# Patient Record
Sex: Female | Born: 1935 | Race: White | Hispanic: No | Smoking: Former smoker
Health system: Southern US, Community
[De-identification: ages and names within clinical notes are randomized; demographics above are authoritative.]

## PROBLEM LIST (undated history)

## (undated) DIAGNOSIS — D649 Anemia, unspecified: Secondary | ICD-10-CM

## (undated) DIAGNOSIS — M810 Age-related osteoporosis without current pathological fracture: Secondary | ICD-10-CM

## (undated) DIAGNOSIS — M1990 Unspecified osteoarthritis, unspecified site: Secondary | ICD-10-CM

## (undated) DIAGNOSIS — K5790 Diverticulosis of intestine, part unspecified, without perforation or abscess without bleeding: Secondary | ICD-10-CM

## (undated) DIAGNOSIS — R51 Headache: Secondary | ICD-10-CM

## (undated) DIAGNOSIS — E079 Disorder of thyroid, unspecified: Secondary | ICD-10-CM

## (undated) HISTORY — PX: EYE SURGERY: SHX253

## (undated) HISTORY — PX: BLADDER SURGERY: SHX569

## (undated) HISTORY — PX: APPENDECTOMY: SHX54

## (undated) HISTORY — PX: ABDOMINAL HYSTERECTOMY: SHX81

## (undated) HISTORY — PX: OTHER SURGICAL HISTORY: SHX169

---

## 1996-05-01 HISTORY — PX: COLON SURGERY: SHX602

## 2003-05-02 DIAGNOSIS — D649 Anemia, unspecified: Secondary | ICD-10-CM

## 2003-05-02 HISTORY — DX: Anemia, unspecified: D64.9

## 2004-04-11 ENCOUNTER — Ambulatory Visit: Payer: Self-pay

## 2004-04-15 ENCOUNTER — Ambulatory Visit: Payer: Self-pay | Admitting: Obstetrics and Gynecology

## 2004-05-18 ENCOUNTER — Ambulatory Visit: Payer: Self-pay

## 2004-08-05 ENCOUNTER — Ambulatory Visit: Payer: Self-pay | Admitting: Podiatry

## 2005-04-26 ENCOUNTER — Ambulatory Visit: Payer: Self-pay | Admitting: Podiatry

## 2005-11-14 ENCOUNTER — Ambulatory Visit: Payer: Self-pay | Admitting: Internal Medicine

## 2006-12-20 ENCOUNTER — Ambulatory Visit: Payer: Self-pay | Admitting: Internal Medicine

## 2007-01-01 ENCOUNTER — Ambulatory Visit: Payer: Self-pay | Admitting: Internal Medicine

## 2007-08-23 ENCOUNTER — Ambulatory Visit: Payer: Self-pay | Admitting: General Surgery

## 2009-05-04 ENCOUNTER — Ambulatory Visit: Payer: Self-pay | Admitting: Rheumatology

## 2009-05-04 ENCOUNTER — Ambulatory Visit: Payer: Self-pay | Admitting: Internal Medicine

## 2010-07-01 ENCOUNTER — Ambulatory Visit: Payer: Self-pay | Admitting: Gastroenterology

## 2010-07-04 ENCOUNTER — Ambulatory Visit: Payer: Self-pay | Admitting: Internal Medicine

## 2011-05-02 DIAGNOSIS — R519 Headache, unspecified: Secondary | ICD-10-CM

## 2011-05-02 HISTORY — DX: Headache, unspecified: R51.9

## 2011-10-24 ENCOUNTER — Ambulatory Visit: Payer: Self-pay | Admitting: Rheumatology

## 2012-02-15 ENCOUNTER — Ambulatory Visit: Payer: Self-pay | Admitting: Neurology

## 2013-09-19 ENCOUNTER — Encounter: Payer: Self-pay | Admitting: Podiatry

## 2013-09-19 ENCOUNTER — Ambulatory Visit (INDEPENDENT_AMBULATORY_CARE_PROVIDER_SITE_OTHER): Payer: Medicare Other | Admitting: Podiatry

## 2013-09-19 VITALS — BP 101/51 | HR 73 | Resp 16 | Ht 62.0 in | Wt 115.0 lb

## 2013-09-19 DIAGNOSIS — L84 Corns and callosities: Secondary | ICD-10-CM

## 2013-09-19 DIAGNOSIS — M7750 Other enthesopathy of unspecified foot: Secondary | ICD-10-CM

## 2013-09-19 NOTE — Progress Notes (Signed)
   Subjective:    Patient ID: Emma Kennedy, female    DOB: 01-16-1930, 80 y.o.   MRN: [id number]  HPI Comments: i have warts on the bottom of my right foot. It does hurt with pressure. They have been there since mothers day 2015. i have used Vaseline and callus pads. They are getting some better.     Review of Systems  Constitutional: Positive for fatigue.  HENT: Positive for trouble swallowing.        Sinus problems  Eyes: Positive for pain.  Respiratory: Positive for shortness of breath.   Cardiovascular:       Calf pain when walking  Gastrointestinal: Positive for abdominal pain.  Endocrine: Positive for heat intolerance.       Excessive thirst  Musculoskeletal:       Joint pain Back pain Difficulty walking   Neurological: Positive for weakness.  Hematological: Bruises/bleeds easily.  Psychiatric/Behavioral: The patient is nervous/anxious.   All other systems reviewed and are negative.      Objective:   Physical Exam        Assessment & Plan:

## 2013-09-19 NOTE — Progress Notes (Signed)
Subjective:     Patient ID: Emma Kennedy, female   DOB: Apr 26, 1930, 80 y.o.   MRN: [id number]  HPI patient presents stating that she does have pain with a lesion on her right foot and also pain in her feet in general for which she wears orthotics. States it's been really bothering her for the last 3 or 4 weeks   Review of Systems  All other systems reviewed and are negative.      Objective:   Physical Exam  Nursing note and vitals reviewed. Constitutional: She is oriented to person, place, and time.  Cardiovascular: Intact distal pulses.   Musculoskeletal: Normal range of motion.  Neurological: She is oriented to person, place, and time.  Skin: Skin is warm.   neurovascular status intact with no changes in health history and patient noted to have a painful keratotic lesion underneath the right foot around the third metatarsal and general instability of the arch upon gait evaluation. 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

## 2015-04-15 ENCOUNTER — Other Ambulatory Visit: Payer: Self-pay | Admitting: Specialist

## 2015-04-15 DIAGNOSIS — R0602 Shortness of breath: Secondary | ICD-10-CM

## 2015-04-15 DIAGNOSIS — J849 Interstitial pulmonary disease, unspecified: Secondary | ICD-10-CM

## 2015-04-28 ENCOUNTER — Other Ambulatory Visit: Payer: Self-pay | Admitting: Specialist

## 2015-04-28 ENCOUNTER — Ambulatory Visit
Admission: RE | Admit: 2015-04-28 | Discharge: 2015-04-28 | Disposition: A | Discharge status: Home / Self Care | Payer: Medicare Other | Source: Ambulatory Visit | Attending: Specialist | Admitting: Specialist

## 2015-04-28 DIAGNOSIS — J849 Interstitial pulmonary disease, unspecified: Secondary | ICD-10-CM | POA: Diagnosis not present

## 2015-04-28 DIAGNOSIS — N200 Calculus of kidney: Secondary | ICD-10-CM | POA: Diagnosis not present

## 2015-04-28 DIAGNOSIS — R0602 Shortness of breath: Secondary | ICD-10-CM | POA: Insufficient documentation

## 2015-04-28 DIAGNOSIS — I2510 Atherosclerotic heart disease of native coronary artery without angina pectoris: Secondary | ICD-10-CM | POA: Diagnosis not present

## 2015-05-14 DIAGNOSIS — M06041 Rheumatoid arthritis without rheumatoid factor, right hand: Secondary | ICD-10-CM | POA: Diagnosis not present

## 2015-05-14 DIAGNOSIS — M06042 Rheumatoid arthritis without rheumatoid factor, left hand: Secondary | ICD-10-CM | POA: Diagnosis not present

## 2015-05-14 DIAGNOSIS — J849 Interstitial pulmonary disease, unspecified: Secondary | ICD-10-CM | POA: Diagnosis not present

## 2015-05-17 DIAGNOSIS — M0579 Rheumatoid arthritis with rheumatoid factor of multiple sites without organ or systems involvement: Secondary | ICD-10-CM | POA: Diagnosis not present

## 2015-05-17 DIAGNOSIS — Z79899 Other long term (current) drug therapy: Secondary | ICD-10-CM | POA: Diagnosis not present

## 2015-05-20 DIAGNOSIS — M65341 Trigger finger, right ring finger: Secondary | ICD-10-CM | POA: Diagnosis not present

## 2015-05-20 DIAGNOSIS — M150 Primary generalized (osteo)arthritis: Secondary | ICD-10-CM | POA: Diagnosis not present

## 2015-05-20 DIAGNOSIS — J849 Interstitial pulmonary disease, unspecified: Secondary | ICD-10-CM | POA: Diagnosis not present

## 2015-05-20 DIAGNOSIS — M0579 Rheumatoid arthritis with rheumatoid factor of multiple sites without organ or systems involvement: Secondary | ICD-10-CM | POA: Diagnosis not present

## 2015-05-20 DIAGNOSIS — M542 Cervicalgia: Secondary | ICD-10-CM | POA: Diagnosis not present

## 2015-06-10 DIAGNOSIS — R0789 Other chest pain: Secondary | ICD-10-CM | POA: Diagnosis not present

## 2015-06-10 DIAGNOSIS — M06041 Rheumatoid arthritis without rheumatoid factor, right hand: Secondary | ICD-10-CM | POA: Diagnosis not present

## 2015-06-10 DIAGNOSIS — R0602 Shortness of breath: Secondary | ICD-10-CM | POA: Diagnosis not present

## 2015-06-10 DIAGNOSIS — E784 Other hyperlipidemia: Secondary | ICD-10-CM | POA: Diagnosis not present

## 2015-06-10 DIAGNOSIS — M06042 Rheumatoid arthritis without rheumatoid factor, left hand: Secondary | ICD-10-CM | POA: Diagnosis not present

## 2015-06-28 DIAGNOSIS — R0789 Other chest pain: Secondary | ICD-10-CM | POA: Diagnosis not present

## 2015-06-28 DIAGNOSIS — R0602 Shortness of breath: Secondary | ICD-10-CM | POA: Diagnosis not present

## 2015-07-06 DIAGNOSIS — R0602 Shortness of breath: Secondary | ICD-10-CM | POA: Diagnosis not present

## 2015-07-06 DIAGNOSIS — J069 Acute upper respiratory infection, unspecified: Secondary | ICD-10-CM | POA: Diagnosis not present

## 2015-07-13 DIAGNOSIS — R05 Cough: Secondary | ICD-10-CM | POA: Diagnosis not present

## 2015-07-13 DIAGNOSIS — M06041 Rheumatoid arthritis without rheumatoid factor, right hand: Secondary | ICD-10-CM | POA: Diagnosis not present

## 2015-07-13 DIAGNOSIS — G609 Hereditary and idiopathic neuropathy, unspecified: Secondary | ICD-10-CM | POA: Diagnosis not present

## 2015-07-13 DIAGNOSIS — M06042 Rheumatoid arthritis without rheumatoid factor, left hand: Secondary | ICD-10-CM | POA: Diagnosis not present

## 2015-07-13 DIAGNOSIS — J069 Acute upper respiratory infection, unspecified: Secondary | ICD-10-CM | POA: Diagnosis not present

## 2015-07-13 DIAGNOSIS — R0602 Shortness of breath: Secondary | ICD-10-CM | POA: Diagnosis not present

## 2015-07-19 DIAGNOSIS — E784 Other hyperlipidemia: Secondary | ICD-10-CM | POA: Diagnosis not present

## 2015-07-19 DIAGNOSIS — R0602 Shortness of breath: Secondary | ICD-10-CM | POA: Diagnosis not present

## 2015-07-19 DIAGNOSIS — R0789 Other chest pain: Secondary | ICD-10-CM | POA: Diagnosis not present

## 2015-07-23 DIAGNOSIS — J8489 Other specified interstitial pulmonary diseases: Secondary | ICD-10-CM | POA: Diagnosis not present

## 2015-07-23 DIAGNOSIS — R05 Cough: Secondary | ICD-10-CM | POA: Diagnosis not present

## 2015-09-29 DIAGNOSIS — E559 Vitamin D deficiency, unspecified: Secondary | ICD-10-CM | POA: Diagnosis not present

## 2015-09-29 DIAGNOSIS — D6489 Other specified anemias: Secondary | ICD-10-CM | POA: Diagnosis not present

## 2015-09-29 DIAGNOSIS — E784 Other hyperlipidemia: Secondary | ICD-10-CM | POA: Diagnosis not present

## 2015-09-29 DIAGNOSIS — E034 Atrophy of thyroid (acquired): Secondary | ICD-10-CM | POA: Diagnosis not present

## 2015-10-07 DIAGNOSIS — D6489 Other specified anemias: Secondary | ICD-10-CM | POA: Diagnosis not present

## 2015-10-07 DIAGNOSIS — M810 Age-related osteoporosis without current pathological fracture: Secondary | ICD-10-CM | POA: Diagnosis not present

## 2015-10-07 DIAGNOSIS — E559 Vitamin D deficiency, unspecified: Secondary | ICD-10-CM | POA: Diagnosis not present

## 2015-10-07 DIAGNOSIS — M06042 Rheumatoid arthritis without rheumatoid factor, left hand: Secondary | ICD-10-CM | POA: Diagnosis not present

## 2015-10-07 DIAGNOSIS — G629 Polyneuropathy, unspecified: Secondary | ICD-10-CM | POA: Diagnosis not present

## 2015-10-07 DIAGNOSIS — E784 Other hyperlipidemia: Secondary | ICD-10-CM | POA: Diagnosis not present

## 2015-10-07 DIAGNOSIS — M06041 Rheumatoid arthritis without rheumatoid factor, right hand: Secondary | ICD-10-CM | POA: Diagnosis not present

## 2015-10-07 DIAGNOSIS — I2510 Atherosclerotic heart disease of native coronary artery without angina pectoris: Secondary | ICD-10-CM | POA: Diagnosis not present

## 2015-10-07 DIAGNOSIS — E034 Atrophy of thyroid (acquired): Secondary | ICD-10-CM | POA: Diagnosis not present

## 2015-10-07 DIAGNOSIS — M150 Primary generalized (osteo)arthritis: Secondary | ICD-10-CM | POA: Diagnosis not present

## 2015-10-07 DIAGNOSIS — G250 Essential tremor: Secondary | ICD-10-CM | POA: Diagnosis not present

## 2015-10-25 DIAGNOSIS — M810 Age-related osteoporosis without current pathological fracture: Secondary | ICD-10-CM | POA: Diagnosis not present

## 2015-11-11 DIAGNOSIS — M0579 Rheumatoid arthritis with rheumatoid factor of multiple sites without organ or systems involvement: Secondary | ICD-10-CM | POA: Diagnosis not present

## 2015-11-16 DIAGNOSIS — M545 Low back pain: Secondary | ICD-10-CM | POA: Diagnosis not present

## 2015-11-16 DIAGNOSIS — M65341 Trigger finger, right ring finger: Secondary | ICD-10-CM | POA: Diagnosis not present

## 2015-11-16 DIAGNOSIS — M0579 Rheumatoid arthritis with rheumatoid factor of multiple sites without organ or systems involvement: Secondary | ICD-10-CM | POA: Diagnosis not present

## 2015-11-16 DIAGNOSIS — G8929 Other chronic pain: Secondary | ICD-10-CM | POA: Diagnosis not present

## 2015-11-16 DIAGNOSIS — M150 Primary generalized (osteo)arthritis: Secondary | ICD-10-CM | POA: Diagnosis not present

## 2015-11-16 DIAGNOSIS — R252 Cramp and spasm: Secondary | ICD-10-CM | POA: Diagnosis not present

## 2015-11-16 DIAGNOSIS — M810 Age-related osteoporosis without current pathological fracture: Secondary | ICD-10-CM | POA: Diagnosis not present

## 2015-11-16 DIAGNOSIS — M542 Cervicalgia: Secondary | ICD-10-CM | POA: Diagnosis not present

## 2015-11-19 DIAGNOSIS — M810 Age-related osteoporosis without current pathological fracture: Secondary | ICD-10-CM | POA: Diagnosis not present

## 2015-11-22 ENCOUNTER — Ambulatory Visit: Payer: PPO | Attending: Rheumatology | Admitting: Occupational Therapy

## 2015-11-22 DIAGNOSIS — M79641 Pain in right hand: Secondary | ICD-10-CM | POA: Insufficient documentation

## 2015-11-22 DIAGNOSIS — M25641 Stiffness of right hand, not elsewhere classified: Secondary | ICD-10-CM | POA: Diagnosis not present

## 2015-11-22 DIAGNOSIS — M6530 Trigger finger, unspecified finger: Secondary | ICD-10-CM

## 2015-11-22 DIAGNOSIS — M6281 Muscle weakness (generalized): Secondary | ICD-10-CM

## 2015-11-22 NOTE — Patient Instructions (Signed)
Contrast do  MC block splint on 4th with any gripping act  Modifications of task -avoid tight and composite fisting   built up  Larger joints use - not grip -   Ice massage over A1 pulley during day

## 2015-11-22 NOTE — Therapy (Signed)
[place] [facility] [address] [place], [address] Phone: [phone]   Fax:  [phone]  Occupational Therapy Treatment  Patient Details  Name: Emma Kennedy MRN: [id number] Date of Birth: 06/22/1929 Referring Provider: Jefm Bryant  Encounter Date: 11/22/2015      OT End of Session - 11/22/15 1844    Visit Number 1   Number of Visits 6   Date for OT Re-Evaluation 12/20/15   OT Start Time 1403   OT Stop Time 1501   OT Time Calculation (min) 58 min   Activity Tolerance Patient tolerated treatment well   Behavior During Therapy [facility] for tasks assessed/performed      No past medical history on file.  No past surgical history on file.  There were no vitals filed for this visit.      Subjective Assessment - 11/22/15 1834    Subjective  Has arthritis since the 90's and trigger finger for while - I tape it to prevent it from locking when gripping a lot - pain 10/10 when locking - tender in palm - cannot make fist in my R domimant hand - but  I did not want shot    Patient Stated Goals I would like my finger to stop triggering and the pain better - so I can cont gardening , quilting, cooking and doing weights at gym   Currently in Pain? No/denies            [facility] OT Assessment - 11/22/15 0001      Assessment   Diagnosis RA and R 4th trigger finger release   Referring Provider Jefm Bryant   Onset Date 11/16/15     Home  Environment   Lives With Alone     Prior Function   Vocation Retired   Leisure go to gym, gardening, quilting, cooking, - R hand dominant     Strength   Right Hand Grip (lbs) 10   Right Hand Lateral Pinch 8 lbs   Right Hand 3 Point Pinch 5 lbs   Left Hand Grip (lbs) 15   Left Hand Lateral Pinch 6 lbs   Left Hand 3 Point Pinch 4 lbs     Right Hand AROM   R Index  MCP 0-90 80 Degrees   R Index PIP 0-100 50 Degrees   R Long  MCP 0-90 75 Degrees   R Long PIP 0-100 60 Degrees  -37   R Ring  MCP 0-90 70 Degrees   R Ring PIP 0-100 70 Degrees  -20   R Little  MCP 0-90 90 Degrees   R Little PIP 0-100 100 Degrees      Contrast done - and then reviewed Home program for splint and modifying activities                    OT Education - 11/22/15 1844    Education provided Yes   Education Details HEP   Person(s) Educated Patient   Methods Explanation;Demonstration;Tactile cues;Verbal cues;Handout   Comprehension Verbal cues required;Returned demonstration;Verbalized understanding          OT Short Term Goals - 11/22/15 1854      OT SHORT TERM GOAL #1   Title Pain on PRWHE improve with 10 points at least   Baseline PRWHE for pain at eval 25/50   Time 3   Period Weeks   Status New           OT Long Term  Goals - 11/22/15 1857      OT LONG TERM GOAL #1   Title Pt report decrease triggering  during ADL's and IADL's to only 1 x day -and tenderness less than 1/10 over A1pulley at 4th    Baseline Triggering every time during composite fist - tape it - 10/10 pain - tenderness over A1pulley   Time 4   Period Weeks   Status New     OT LONG TERM GOAL #2   Title AROM in  4th PIP extention improve with 10 degrees and flexion in digits improve for pt to touch palm    Baseline Every time grip 4th lockes - -35 to -20 at 3rd and 4th - and flexoin decrease see flowsheet    Time 4   Period Weeks   Status New               Plan - 11/22/15 1845    Clinical Impression Statement Pt present with diagnosis of RA and R 4th trigger finger - had it for while - pt decline shot - pt  present with stiffness and decrease ROM in all digits on R hand - flexor contracture on 3rd and 4th PIP's -   pain over A 1 pulley of 4th -  and towards PIP joint -  increase trggering with composite fist - and decrease grip and prehension  strength  - decreasing her independency in functional tasks    Rehab Potential Fair   OT Frequency 2x / week   OT Duration 4 weeks   OT  Treatment/Interventions Self-care/ADL training;Iontophoresis;Ultrasound;Contrast Bath;Therapeutic exercise;Manual Therapy;DME and/or AE instruction;Splinting;Patient/family education;Passive range of motion   Plan assess progress with HEP - await order from Dr Jefm Bryant for inonto with dexamethasone   OT Home Exercise Plan see pt instruction    Consulted and Agree with Plan of Care Patient      Patient will benefit from skilled therapeutic intervention in order to improve the following deficits and impairments:  Decreased coordination, Decreased range of motion, Impaired flexibility, Decreased knowledge of precautions, Decreased knowledge of use of DME, Impaired UE functional use, Pain, Decreased strength  Visit Diagnosis: Pain in right hand - Plan: Ot plan of care cert/re-cert  Stiffness of right hand, not elsewhere classified - Plan: Ot plan of care cert/re-cert  Muscle weakness (generalized) - Plan: Ot plan of care cert/re-cert  Triggering of finger - Plan: Ot plan of care cert/re-cert    Problem List There are no active problems to display for this patient.   Rosalyn Gess OTR/L,CLT  11/22/2015, 7:05 PM  [place] [facility] [address], [place], [address] Phone: [phone]   Fax:  [phone]  Name: YAJAYRA COON MRN: [id number] Date of Birth: 1929/06/07

## 2015-11-29 ENCOUNTER — Ambulatory Visit: Payer: PPO | Admitting: Occupational Therapy

## 2015-12-01 ENCOUNTER — Ambulatory Visit: Payer: PPO | Attending: Rheumatology | Admitting: Occupational Therapy

## 2015-12-01 DIAGNOSIS — M6281 Muscle weakness (generalized): Secondary | ICD-10-CM | POA: Insufficient documentation

## 2015-12-01 DIAGNOSIS — M6530 Trigger finger, unspecified finger: Secondary | ICD-10-CM | POA: Diagnosis not present

## 2015-12-01 DIAGNOSIS — M79641 Pain in right hand: Secondary | ICD-10-CM | POA: Diagnosis not present

## 2015-12-01 DIAGNOSIS — M25641 Stiffness of right hand, not elsewhere classified: Secondary | ICD-10-CM | POA: Insufficient documentation

## 2015-12-01 NOTE — Patient Instructions (Signed)
Wear MC block splint on 4th on R and 3rd on L hand - avoid tight and sustained grip built up handles - use palms  Tendon glides - AROM but not full fist   Contrast to decrease pain - and ice massage during day as needed

## 2015-12-01 NOTE — Therapy (Signed)
[place] [facility] [address] [place], [address] Phone: [phone]   Fax:  [phone]  Occupational Therapy Treatment  Patient Details  Name: Emma Kennedy MRN: [id number] Date of Birth: 1929/05/03 Referring Provider: Jefm Bryant  Encounter Date: 12/01/2015      OT End of Session - 12/01/15 1403    Visit Number 2   Number of Visits 6   Date for OT Re-Evaluation 12/20/15   OT Start Time 1230   OT Stop Time 1323   OT Time Calculation (min) 53 min   Activity Tolerance Patient tolerated treatment well   Behavior During Therapy [facility] for tasks assessed/performed      No past medical history on file.  No past surgical history on file.  There were no vitals filed for this visit.      Subjective Assessment - 12/01/15 1357    Subjective  My L middle finger locked this week one time - and it really hurts - and then the little splint on my R hand can you make it shorter - I am trying to modify how I grip things - test going to be this afternoon - I need to peel apples to dry    Patient Stated Goals I would like my finger to stop triggering and the pain better - so I can cont gardening , quilting, cooking and doing weights at gym   Currently in Pain? No/denies     measurements taken for flexion of R hand digits - MC 's improve to 90, 70, 80 and 90 2nd thru 5th  And PIP's 80,80,100 100 on the R had 2nd thru 5th   Pt extention of PIP still same at 3rd and 4th   Pt to cont with tendon glides for MC flexion and  Intrinsic fist  And then ed and reviewed again joint protection and modifications  As well as AE   MC block splint fabricated for 4th on R and 3rd on L - to wear with gripping activities  Shorter this time - pt report that feels comfortable   Iontophoresis with dexamethaozone - small patch - done to A1 pulley at 3rd on L and 4th on R - 2.0 current and 19 min  Explain to pt what to expect and then skin check  done at end when taking off                          OT Education - 12/01/15 1404    Education provided Yes   Education Details HEP    Person(s) Educated Patient   Methods Explanation;Demonstration;Tactile cues;Verbal cues   Comprehension Returned demonstration;Verbalized understanding;Verbal cues required          OT Short Term Goals - 11/22/15 1854      OT SHORT TERM GOAL #1   Title Pain on PRWHE improve with 10 points at least   Baseline PRWHE for pain at eval 25/50   Time 3   Period Weeks   Status New           OT Long Term Goals - 11/22/15 1857      OT LONG TERM GOAL #1   Title Pt report decrease triggering  during ADL's and IADL's to only 1 x day -and tenderness less than 1/10 over A1pulley at 4th    Baseline Triggering every time during composite fist - tape it - 10/10 pain - tenderness over A1pulley  Time 4   Period Weeks   Status New     OT LONG TERM GOAL #2   Title AROM in  4th PIP extention improve with 10 degrees and flexion in digits improve for pt to touch palm    Baseline Every time grip 4th lockes - -35 to -20 at 3rd and 4th - and flexoin decrease see flowsheet    Time 4   Period Weeks   Status New               Plan - 12/01/15 1405    Clinical Impression Statement Pt made progress in flexion of R hand digits since last week - but still triggering but less on 4th in R hand - still tender over A1 pulley 3rd and 4th - extention of PIP on 4th and 3rd still impaired - and  pt had 3rd digit on L lock on her one time this week - pt in need for [facility] block splint that is shorter - done this date and for  L 3rd    Rehab Potential Fair   OT Frequency 2x / week   OT Duration 4 weeks   OT Treatment/Interventions Self-care/ADL training;Iontophoresis;Ultrasound;Contrast Bath;Therapeutic exercise;Manual Therapy;DME and/or AE instruction;Splinting;Patient/family education;Passive range of motion   Plan assess progress with HEP - triggering  less and wearing of splints    OT Home Exercise Plan see pt instruction    Consulted and Agree with Plan of Care Patient      Patient will benefit from skilled therapeutic intervention in order to improve the following deficits and impairments:  Decreased coordination, Decreased range of motion, Impaired flexibility, Decreased knowledge of precautions, Decreased knowledge of use of DME, Impaired UE functional use, Pain, Decreased strength  Visit Diagnosis: Pain in right hand  Stiffness of right hand, not elsewhere classified  Muscle weakness (generalized)  Triggering of finger    Problem List There are no active problems to display for this patient.   Rosalyn Gess OTR/L,CLT  12/01/2015, 2:10 PM  [place] [facility] [address] [place], [address] Phone: [phone]   Fax:  [phone]  Name: Emma Kennedy MRN: [id number] Date of Birth: 21-Aug-1929

## 2015-12-02 DIAGNOSIS — M7742 Metatarsalgia, left foot: Secondary | ICD-10-CM | POA: Diagnosis not present

## 2015-12-02 DIAGNOSIS — M7741 Metatarsalgia, right foot: Secondary | ICD-10-CM | POA: Diagnosis not present

## 2015-12-06 ENCOUNTER — Ambulatory Visit: Payer: PPO | Admitting: Occupational Therapy

## 2015-12-08 ENCOUNTER — Ambulatory Visit: Payer: PPO | Admitting: Occupational Therapy

## 2015-12-08 DIAGNOSIS — M79641 Pain in right hand: Secondary | ICD-10-CM

## 2015-12-08 DIAGNOSIS — M25641 Stiffness of right hand, not elsewhere classified: Secondary | ICD-10-CM

## 2015-12-08 DIAGNOSIS — M6530 Trigger finger, unspecified finger: Secondary | ICD-10-CM

## 2015-12-08 DIAGNOSIS — M6281 Muscle weakness (generalized): Secondary | ICD-10-CM

## 2015-12-08 NOTE — Patient Instructions (Addendum)
Pt to cont with  joint protection and modifications  Pt report she was squeezing ball and something else all a long - to strenghten her hand - explain for pt to hold off on any resistance for trigger fingers  Pt did modify her grips at home - insulation tubing and wrap things around tools , broom and pen    MC block splint extra padding provided in palm and velcro to keep in place during night time  - to wear with gripping activities   pt report that feels comfortable   I

## 2015-12-08 NOTE — Therapy (Signed)
[place] [facility] [address], [place], [address] Phone: [phone]   Fax:  [phone]  Occupational Therapy Treatment  Patient Details  Name: Emma Kennedy MRN: [id number] Date of Birth: 02/23/1930 Referring Provider: Jefm Bryant  Encounter Date: 12/08/2015      OT End of Session - 12/08/15 1254    Visit Number 3   Number of Visits 6   Date for OT Re-Evaluation 12/20/15   OT Start Time [id number]   OT Stop Time 1329   OT Time Calculation (min) 54 min   Activity Tolerance Patient tolerated treatment well   Behavior During Therapy [facility] for tasks assessed/performed      No past medical history on file.  No past surgical history on file.  There were no vitals filed for this visit.      Subjective Assessment - 12/08/15 1251    Subjective  I used the ring splints like you told me- pulling weed, gripping - I L hand did not  trigger but the R still wants to - the splints hurt some what in my hand - and I loss them at night time    Patient Stated Goals I would like my finger to stop triggering and the pain better - so I can cont gardening , quilting, cooking and doing weights at gym   Currently in Pain? No/denies                      OT Treatments/Exercises (OP) - 12/08/15 0001      RUE Paraffin   Number Minutes Paraffin 10 Minutes   RUE Paraffin Location Hand   Comments at [facility] to decrease Pain and increase ROM in digits       [place] tools nr 2 and 4 done to volar 4th digit on R and over A1pulley after paraffin to decrease pain and increase extention at PIP  Pt has some tightness - pt to cont to work some on  extention of PIP   Pt to cont with tendon glides for MC flexion and  Intrinsic fist  And then ed and reviewed again joint protection and modifications  Pt report she was squeezing ball and something else all a long - to strenghten her hand - explain for pt to hold off on any resistance for  trigger fingers  Pt did modify her grips at home - insulation tubing and wrap things around tools , broom and pen    MC block splint extra padding provided in palm and velcro to keep in place during night time  - to wear with gripping activities   pt report that feels comfortable   Iontophoresis with dexamethaozone - small patch - done to A1 pulley at 4th on R - 2.0 current and 19 min  Explain to pt what to expect and then skin check done at end when taking off              OT Education - 12/08/15 1254    Education provided Yes   Education Details HEP   Person(s) Educated Patient   Methods Explanation;Demonstration;Tactile cues;Verbal cues   Comprehension Returned demonstration;Verbal cues required;Verbalized understanding          OT Short Term Goals - 11/22/15 1854      OT SHORT TERM GOAL #1   Title Pain on PRWHE improve with 10 points at least   Baseline PRWHE for pain at eval 25/50   Time 3  Period Weeks   Status New           OT Long Term Goals - 11/22/15 1857      OT LONG TERM GOAL #1   Title Pt report decrease triggering  during ADL's and IADL's to only 1 x day -and tenderness less than 1/10 over A1pulley at 4th    Baseline Triggering every time during composite fist - tape it - 10/10 pain - tenderness over A1pulley   Time 4   Period Weeks   Status New     OT LONG TERM GOAL #2   Title AROM in  4th PIP extention improve with 10 degrees and flexion in digits improve for pt to touch palm    Baseline Every time grip 4th lockes - -35 to -20 at 3rd and 4th - and flexoin decrease see flowsheet    Time 4   Period Weeks   Status New               Plan - 12/08/15 1255    Clinical Impression Statement Pt cont to show pain /tenderness over A1pulley at 3rd on R hand , and triggering  - pt also has decrease AROM for fisting - extention lag on 3rd and 4th - discuss with pt if not improve by next week - would recommend follow up with MD for possible  shot - pt reprot she is squeezing at ball and something else - discuss with pt that wil increase triggering and pain in digist - to hold off - wear MC blcok splint do contrast and  ice as needed  - will reassess next week    Rehab Potential Fair   OT Frequency 2x / week   OT Duration 4 weeks   OT Treatment/Interventions Self-care/ADL training;Iontophoresis;Ultrasound;Contrast Bath;Therapeutic exercise;Manual Therapy;DME and/or AE instruction;Splinting;Patient/family education;Passive range of motion   Plan assess triggering , splint wearing - pain    OT Home Exercise Plan see pt instruction    Consulted and Agree with Plan of Care Patient      Patient will benefit from skilled therapeutic intervention in order to improve the following deficits and impairments:  Decreased coordination, Decreased range of motion, Impaired flexibility, Decreased knowledge of precautions, Decreased knowledge of use of DME, Impaired UE functional use, Pain, Decreased strength  Visit Diagnosis: Pain in right hand  Stiffness of right hand, not elsewhere classified  Muscle weakness (generalized)  Triggering of finger    Problem List There are no active problems to display for this patient.   Rosalyn Gess OTR/L,CLT  12/08/2015, 2:51 PM  [place] [facility] [address], [place], [address] Phone: [phone]   Fax:  [phone]  Name: Emma Kennedy MRN: [id number] Date of Birth: [birth year]

## 2015-12-17 ENCOUNTER — Ambulatory Visit: Payer: PPO | Admitting: Occupational Therapy

## 2015-12-17 DIAGNOSIS — M6281 Muscle weakness (generalized): Secondary | ICD-10-CM

## 2015-12-17 DIAGNOSIS — M25641 Stiffness of right hand, not elsewhere classified: Secondary | ICD-10-CM

## 2015-12-17 DIAGNOSIS — M6530 Trigger finger, unspecified finger: Secondary | ICD-10-CM

## 2015-12-17 DIAGNOSIS — M79641 Pain in right hand: Secondary | ICD-10-CM | POA: Diagnosis not present

## 2015-12-17 NOTE — Patient Instructions (Signed)
Cont with joint protection  Get Pen again for writing  And correct pruning scissors   Tendon glides   Thumb PA and RA  Opposition   Avoid tight and sustained grip  Wear MC block splints as needed

## 2015-12-17 NOTE — Therapy (Signed)
[place] [facility] [address], [place], [address] Phone: [phone]   Fax:  [phone]  Occupational Therapy Treatment  Patient Details  Name: Emma Kennedy MRN: [id number] Date of Birth: Oct 10, 1929 Referring Provider: Jefm Bryant  Encounter Date: 12/17/2015      OT End of Session - 12/17/15 1400    Visit Number 4   Number of Visits 4   Date for OT Re-Evaluation 12/17/15   OT Start Time 1300   OT Stop Time 1350   OT Time Calculation (min) 50 min   Activity Tolerance Patient tolerated treatment well   Behavior During Therapy [facility] for tasks assessed/performed      No past medical history on file.  No past surgical history on file.  There were no vitals filed for this visit.      Subjective Assessment - 12/17/15 1357    Subjective  Since I seen you I stopped squeezing that grip thing, enlarging my grips and helping with my palms to lift objects - pain been better and triggering - Can I start quilting again    Patient Stated Goals I would like my finger to stop triggering and the pain better - so I can cont gardening , quilting, cooking and doing weights at gym   Currently in Pain? No/denies            [facility] OT Assessment - 12/17/15 0001      Strength   Right Hand Grip (lbs) 15   Right Hand Lateral Pinch 9 lbs   Right Hand 3 Point Pinch 6 lbs   Left Hand Grip (lbs) 24   Left Hand Lateral Pinch 8 lbs   Left Hand 3 Point Pinch 6 lbs     Right Hand AROM   R Index  MCP 0-90 80 Degrees   R Index PIP 0-100 55 Degrees   R Long  MCP 0-90 90 Degrees   R Long PIP 0-100 80 Degrees   R Ring  MCP 0-90 80 Degrees   R Ring PIP 0-100 100 Degrees   R Little  MCP 0-90 90 Degrees   R Little PIP 0-100 100 Degrees      Measurements taken  pt report pain decrease  And used some jointprotection during day   reviewed some AE   Get Pen again for writing  And correct pruning scissors  And enlarging grips- as  well as why triggering finger happens   Tendon glides   Thumb PA and RA  Opposition  Reviewed - needed repeat practice - mod A and mod v/c  Avoid tight and sustained grip  Wear MC block splints as needed - when sustained grip like starting [place]                       OT Education - 12/17/15 1400    Education provided Yes   Education Details Findings of assessment and update HEP   Person(s) Educated Patient   Methods Explanation;Demonstration;Tactile cues;Verbal cues;Handout   Comprehension Verbal cues required;Returned demonstration;Verbalized understanding          OT Short Term Goals - 12/17/15 1401      OT SHORT TERM GOAL #1   Title Pain on PRWHE improve with 10 points at least   Baseline PRWHE for pain at eval 25/50 and improve to 13/50   Status Achieved           OT Long Term Goals -  12/17/15 1401      OT LONG TERM GOAL #1   Title Pt report decrease triggering  during ADL's and IADL's to only 1 x day -and tenderness less than 1/10 over A1pulley at 4th    Baseline Still tender over R 4th A1pulley and 3rd - but less triggering    Status Partially Met     OT LONG TERM GOAL #2   Title AROM in  4th PIP extention improve with 10 degrees and flexion in digits improve for pt to touch palm    Baseline Flexion improve but extention same    Status Partially Met               Plan - 12/17/15 1402    Clinical Impression Statement Pt made progress since [place] in AROM in R hand , increase grip and prehension strength - decrease pain but still some triggering, but report only 1 x during day - pt gardening decreasing now , so that should help and need to work on HEP to increase 2nd and 3rd AROM - pt to cont with homeprogram and  follow up with MD in Dec    OT Treatment/Interventions Self-care/ADL training;Iontophoresis;Ultrasound;Contrast Bath;Therapeutic exercise;Manual Therapy;DME and/or AE instruction;Splinting;Patient/family education;Passive range  of motion   Plan discharge with HEP    OT Home Exercise Plan see pt instruction    Consulted and Agree with Plan of Care Patient      Patient will benefit from skilled therapeutic intervention in order to improve the following deficits and impairments:  Decreased coordination, Decreased range of motion, Impaired flexibility, Decreased knowledge of precautions, Decreased knowledge of use of DME, Impaired UE functional use, Pain, Decreased strength  Visit Diagnosis: Pain in right hand  Stiffness of right hand, not elsewhere classified  Muscle weakness (generalized)  Triggering of finger    Problem List There are no active problems to display for this patient.   Rosalyn Gess OTR/L,CLT  12/17/2015, 2:06 PM  [place] [facility] [address] [place], [address] Phone: [phone]   Fax:  [phone]  Name: Emma Kennedy MRN: [id number] Date of Birth: January 06, 1930

## 2016-01-07 DIAGNOSIS — G609 Hereditary and idiopathic neuropathy, unspecified: Secondary | ICD-10-CM | POA: Diagnosis not present

## 2016-01-07 DIAGNOSIS — G44221 Chronic tension-type headache, intractable: Secondary | ICD-10-CM | POA: Diagnosis not present

## 2016-01-07 DIAGNOSIS — M7741 Metatarsalgia, right foot: Secondary | ICD-10-CM | POA: Diagnosis not present

## 2016-01-07 DIAGNOSIS — M7742 Metatarsalgia, left foot: Secondary | ICD-10-CM | POA: Diagnosis not present

## 2016-01-24 DIAGNOSIS — R0602 Shortness of breath: Secondary | ICD-10-CM | POA: Diagnosis not present

## 2016-01-24 DIAGNOSIS — J189 Pneumonia, unspecified organism: Secondary | ICD-10-CM | POA: Diagnosis not present

## 2016-01-24 DIAGNOSIS — J8489 Other specified interstitial pulmonary diseases: Secondary | ICD-10-CM | POA: Diagnosis not present

## 2016-02-21 DIAGNOSIS — H04123 Dry eye syndrome of bilateral lacrimal glands: Secondary | ICD-10-CM | POA: Diagnosis not present

## 2016-04-04 DIAGNOSIS — D6489 Other specified anemias: Secondary | ICD-10-CM | POA: Diagnosis not present

## 2016-04-04 DIAGNOSIS — I2510 Atherosclerotic heart disease of native coronary artery without angina pectoris: Secondary | ICD-10-CM | POA: Diagnosis not present

## 2016-04-12 DIAGNOSIS — M150 Primary generalized (osteo)arthritis: Secondary | ICD-10-CM | POA: Diagnosis not present

## 2016-04-12 DIAGNOSIS — Z0000 Encounter for general adult medical examination without abnormal findings: Secondary | ICD-10-CM | POA: Diagnosis not present

## 2016-04-12 DIAGNOSIS — E784 Other hyperlipidemia: Secondary | ICD-10-CM | POA: Diagnosis not present

## 2016-04-12 DIAGNOSIS — R51 Headache: Secondary | ICD-10-CM | POA: Diagnosis not present

## 2016-04-12 DIAGNOSIS — E034 Atrophy of thyroid (acquired): Secondary | ICD-10-CM | POA: Diagnosis not present

## 2016-04-12 DIAGNOSIS — E559 Vitamin D deficiency, unspecified: Secondary | ICD-10-CM | POA: Diagnosis not present

## 2016-04-12 DIAGNOSIS — M0609 Rheumatoid arthritis without rheumatoid factor, multiple sites: Secondary | ICD-10-CM | POA: Diagnosis not present

## 2016-04-12 DIAGNOSIS — G63 Polyneuropathy in diseases classified elsewhere: Secondary | ICD-10-CM | POA: Diagnosis not present

## 2016-04-12 DIAGNOSIS — I2510 Atherosclerotic heart disease of native coronary artery without angina pectoris: Secondary | ICD-10-CM | POA: Diagnosis not present

## 2016-04-12 DIAGNOSIS — M810 Age-related osteoporosis without current pathological fracture: Secondary | ICD-10-CM | POA: Diagnosis not present

## 2016-04-12 DIAGNOSIS — D638 Anemia in other chronic diseases classified elsewhere: Secondary | ICD-10-CM | POA: Diagnosis not present

## 2016-05-01 ENCOUNTER — Emergency Department
Admission: EM | Admit: 2016-05-01 | Discharge: 2016-05-01 | Disposition: A | Discharge status: Home / Self Care | Payer: PPO | Attending: Emergency Medicine | Admitting: Emergency Medicine

## 2016-05-01 ENCOUNTER — Emergency Department: Payer: PPO

## 2016-05-01 ENCOUNTER — Encounter: Payer: Self-pay | Admitting: Emergency Medicine

## 2016-05-01 DIAGNOSIS — M25511 Pain in right shoulder: Secondary | ICD-10-CM | POA: Insufficient documentation

## 2016-05-01 DIAGNOSIS — Z87891 Personal history of nicotine dependence: Secondary | ICD-10-CM | POA: Diagnosis not present

## 2016-05-01 DIAGNOSIS — Z79899 Other long term (current) drug therapy: Secondary | ICD-10-CM | POA: Insufficient documentation

## 2016-05-01 DIAGNOSIS — R531 Weakness: Secondary | ICD-10-CM | POA: Diagnosis not present

## 2016-05-01 DIAGNOSIS — G8929 Other chronic pain: Secondary | ICD-10-CM | POA: Diagnosis not present

## 2016-05-01 DIAGNOSIS — R5383 Other fatigue: Secondary | ICD-10-CM | POA: Insufficient documentation

## 2016-05-01 DIAGNOSIS — Z7982 Long term (current) use of aspirin: Secondary | ICD-10-CM | POA: Insufficient documentation

## 2016-05-01 DIAGNOSIS — M542 Cervicalgia: Secondary | ICD-10-CM | POA: Diagnosis not present

## 2016-05-01 HISTORY — DX: Unspecified osteoarthritis, unspecified site: M19.90

## 2016-05-01 HISTORY — DX: Age-related osteoporosis without current pathological fracture: M81.0

## 2016-05-01 HISTORY — DX: Disorder of thyroid, unspecified: E07.9

## 2016-05-01 LAB — CBC WITH DIFFERENTIAL/PLATELET
BASOS ABS: 0 10*3/uL (ref 0–0.1)
BASOS PCT: 0 %
EOS ABS: 0 10*3/uL (ref 0–0.7)
EOS PCT: 0 %
HCT: 35.2 % (ref 35.0–47.0)
Hemoglobin: 12 g/dL (ref 12.0–16.0)
LYMPHS ABS: 0.7 10*3/uL — AB (ref 1.0–3.6)
Lymphocytes Relative: 8 %
MCH: 31 pg (ref 26.0–34.0)
MCHC: 34.1 g/dL (ref 32.0–36.0)
MCV: 90.8 fL (ref 80.0–100.0)
Monocytes Absolute: 0.9 10*3/uL (ref 0.2–0.9)
Monocytes Relative: 10 %
NEUTROS PCT: 82 %
Neutro Abs: 7.8 10*3/uL — ABNORMAL HIGH (ref 1.4–6.5)
Platelets: 224 10*3/uL (ref 150–440)
RBC: 3.87 MIL/uL (ref 3.80–5.20)
RDW: 13.4 % (ref 11.5–14.5)
WBC: 9.5 10*3/uL (ref 3.6–11.0)

## 2016-05-01 LAB — URINALYSIS, COMPLETE (UACMP) WITH MICROSCOPIC
BILIRUBIN URINE: NEGATIVE
Bacteria, UA: NONE SEEN
Glucose, UA: NEGATIVE mg/dL
HGB URINE DIPSTICK: NEGATIVE
Ketones, ur: NEGATIVE mg/dL
LEUKOCYTES UA: NEGATIVE
NITRITE: NEGATIVE
PH: 7 (ref 5.0–8.0)
Protein, ur: NEGATIVE mg/dL
SPECIFIC GRAVITY, URINE: 1.011 (ref 1.005–1.030)

## 2016-05-01 LAB — BASIC METABOLIC PANEL
Anion gap: 6 (ref 5–15)
BUN: 14 mg/dL (ref 6–20)
CHLORIDE: 101 mmol/L (ref 101–111)
CO2: 28 mmol/L (ref 22–32)
CREATININE: 0.63 mg/dL (ref 0.44–1.00)
Calcium: 8.8 mg/dL — ABNORMAL LOW (ref 8.9–10.3)
Glucose, Bld: 109 mg/dL — ABNORMAL HIGH (ref 65–99)
Potassium: 3.8 mmol/L (ref 3.5–5.1)
SODIUM: 135 mmol/L (ref 135–145)

## 2016-05-01 MED ORDER — ACETAMINOPHEN 325 MG PO TABS: 650.0000 mg | ORAL_TABLET | Freq: Four times a day (QID) | ORAL | 0 refills | Status: AC | PRN

## 2016-05-01 MED ORDER — DICLOFENAC SODIUM 1 % TD GEL: 2.0000 g | Freq: Four times a day (QID) | TRANSDERMAL | 0 refills | Status: AC

## 2016-05-01 MED ORDER — ACETAMINOPHEN 325 MG PO TABS
ORAL_TABLET | ORAL | Status: DC
Start: 2016-05-01 — End: 2016-05-01
  Filled 2016-05-01: qty 2

## 2016-05-01 MED ORDER — ACETAMINOPHEN 325 MG PO TABS
650.0000 mg | ORAL_TABLET | Freq: Once | ORAL | Status: AC
  Administered 2016-05-01: 650 mg via ORAL

## 2016-05-01 NOTE — ED Provider Notes (Signed)
[facility] Emergency Department Provider Note  ____________________________________________  Time seen: Approximately 1:05 PM  I have reviewed the triage vital signs and the nursing notes.   HISTORY  Chief Complaint Rectal Bleeding and Neck Pain    HPI Emma Kennedy is a 83 y.o. female comes to the ED due to generalized weakness as well as black tarry stools for the past 4 days. She also complains of chronic pain in her right neck and right shoulder, which only hurts when she moves the right arm especially in an abduction motion. She reports that she has a history of bone spurs.No dizziness chest pain shortness of breath or syncope. No falls or injuries. Eating and drinking normally. Has had a viral upper respiratory illness recently.     Past Medical History:  Diagnosis Date  . Arthritis   . Osteoporosis   . Thyroid disease      There are no active problems to display for this patient.    Past Surgical History:  Procedure Laterality Date  . APPENDECTOMY    . bilateral foot surgery     . EYE SURGERY       Prior to Admission medications   Medication Sig Start Date End Date Taking? Authorizing Provider  albuterol (PROVENTIL HFA;VENTOLIN HFA) 108 (90 Base) MCG/ACT inhaler Inhale 2 puffs into the lungs every 6 (six) hours as needed for wheezing. 07/13/15  Yes Historical Provider, MD  alendronate (FOSAMAX) 70 MG tablet Take 70 mg by mouth every 7 (seven) days. 11/16/15 11/15/16 Yes Historical Provider, MD  aspirin 81 MG tablet Take 81 mg by mouth daily.   Yes Historical Provider, MD  Cholecalciferol (VITAMIN D3) 5000 units CAPS Take 5,000 mg by mouth daily.    Yes Historical Provider, MD  gabapentin (NEURONTIN) 300 MG capsule Take 300 mg by mouth 2 (two) times daily.  07/23/13  Yes Historical Provider, MD  levothyroxine (SYNTHROID, LEVOTHROID) 112 MCG tablet  08/11/13  Yes Historical Provider, MD  Multiple Vitamins-Minerals (MULTIVITAMIN PO) Take 1  tablet by mouth daily.   Yes Historical Provider, MD  Polyethyl Glycol-Propyl Glycol (SYSTANE OP) Apply 1 drop to eye as directed.   Yes Historical Provider, MD  polyethylene glycol powder (GLYCOLAX/MIRALAX) powder  08/15/13  Yes Historical Provider, MD  sulfaSALAzine (AZULFIDINE) 500 MG tablet Take 1,000 mg by mouth 2 (two) times daily.  08/26/13  Yes Historical Provider, MD  traZODone (DESYREL) 50 MG tablet Take 25 mg by mouth at bedtime as needed for sleep.  08/24/13  Yes Historical Provider, MD  acetaminophen (TYLENOL) 325 MG tablet Take 2 tablets (650 mg total) by mouth every 6 (six) hours as needed. 05/01/16   Carrie Mew, MD  diclofenac sodium (VOLTAREN) 1 % GEL Apply 2 g topically 4 (four) times daily. 05/01/16   Carrie Mew, MD     Allergies Chloride; Clinoril [sulindac]; Etodolac er; Meloxicam; Methotrexate derivatives; Prednisone; Relafen [nabumetone]; and Trospium   No family history on file.  Social History Social History  Substance Use Topics  . Smoking status: Former Smoker    Packs/day: 1.00    Years: 20.00    Types: Cigarettes  . Smokeless tobacco: Never Used  . Alcohol use No    Review of Systems  Constitutional:   No fever or chills. Positive fatigue ENT:   No sore throat. No rhinorrhea. Cardiovascular:   No chest pain. Respiratory:   No dyspnea or cough. Gastrointestinal:   Negative for abdominal pain, vomiting and diarrhea.  Genitourinary:  Negative for dysuria or difficulty urinating. Musculoskeletal:   Positive right neck and shoulder pain Neurological:   Negative for headaches 10-point ROS otherwise negative.  ____________________________________________   PHYSICAL EXAM:  VITAL SIGNS: ED Triage Vitals  Enc Vitals Group     BP 05/01/16 1027 (!) 160/69     Pulse Rate 05/01/16 1027 75     Resp 05/01/16 1027 18     Temp 05/01/16 1027 98.2 F (36.8 C)     Temp Source 05/01/16 1027 Oral     SpO2 05/01/16 1027 100 %     Weight 05/01/16 1027  104 lb (47.2 kg)     Height 05/01/16 1027 5\' 5"  (1.651 m)     Head Circumference --      Peak Flow --      Pain Score 05/01/16 1028 9     Pain Loc --      Pain Edu? --      Excl. in [place]? --     Vital signs reviewed, nursing assessments reviewed.   Constitutional:   Alert and oriented. Well appearing and in no distress. Eyes:   No scleral icterus. No conjunctival pallor. PERRL. EOMI.  No nystagmus. ENT   Head:   Normocephalic and atraumatic.   Nose:   No congestion/rhinnorhea. No septal hematoma   Mouth/Throat:   MMM, no pharyngeal erythema. No peritonsillar mass.    Neck:   No stridor. No SubQ emphysema. No meningismus. Hematological/Lymphatic/Immunilogical:   No cervical lymphadenopathy. Cardiovascular:   RRR. Symmetric bilateral radial and DP pulses.  No murmurs.  Respiratory:   Normal respiratory effort without tachypnea nor retractions. Breath sounds are clear and equal bilaterally. No wheezes/rales/rhonchi. Gastrointestinal:   Soft and nontender. Non distended. There is no CVA tenderness.  No rebound, rigidity, or guarding. Rectal exam performed with nurse at bedside, brown stool, Hemoccult negative, controls okay Genitourinary:   deferred Musculoskeletal:   Tenderness over lateral head of the right deltoid, pain elicited with abduction of the arm.. Otherwise full range of motion, other extremities with full range of motion and nontender. Neurologic:   Normal speech and language.  CN 2-10 normal. Motor grossly intact. No gross focal neurologic deficits are appreciated.  Skin:    Skin is warm, dry and intact. No rash noted.  No petechiae, purpura, or bullae.  ____________________________________________    LABS (pertinent positives/negatives) (all labs ordered are listed, but only abnormal results are displayed) Labs Reviewed  BASIC METABOLIC PANEL - Abnormal; Notable for the following:       Result Value   Glucose, Bld 109 (*)    Calcium 8.8 (*)    All other  components within normal limits  CBC WITH DIFFERENTIAL/PLATELET - Abnormal; Notable for the following:    Neutro Abs 7.8 (*)    Lymphs Abs 0.7 (*)    All other components within normal limits  URINALYSIS, COMPLETE (UACMP) WITH MICROSCOPIC - Abnormal; Notable for the following:    Color, Urine YELLOW (*)    APPearance HAZY (*)    Squamous Epithelial / LPF 0-5 (*)    All other components within normal limits   ____________________________________________   EKG  Interpreted by me Sinus rhythm rate of 72, normal axis intervals. Normal ST segments and T waves. Poor R-wave progression in anterior precordial leads, no acute ischemic changes.  ____________________________________________    RADIOLOGY    ____________________________________________   PROCEDURES Procedures  ____________________________________________   INITIAL IMPRESSION / ASSESSMENT AND PLAN / ED COURSE  Pertinent labs &  imaging results that were available during my care of the patient were reviewed by me and considered in my medical decision making (see chart for details).  Patient well appearing no acute distress. Workup negative. Pain is clearly musculoskeletal. We'll try Tylenol and Voltaren gel, follow-up with primary care. No evidence of GI bleed or anemia.     Clinical Course    ____________________________________________   FINAL CLINICAL IMPRESSION(S) / ED DIAGNOSES  Final diagnoses:  Neck pain      New Prescriptions   ACETAMINOPHEN (TYLENOL) 325 MG TABLET    Take 2 tablets (650 mg total) by mouth every 6 (six) hours as needed.   DICLOFENAC SODIUM (VOLTAREN) 1 % GEL    Apply 2 g topically 4 (four) times daily.     Portions of this note were generated with dragon dictation software. Dictation errors may occur despite best attempts at proofreading.    Carrie Mew, MD 05/01/16 1310

## 2016-05-01 NOTE — ED Triage Notes (Addendum)
BIB EMS from [facility] independent living. C/o generalized weakness black tarry stools for past 4 days and neck and right shoulder. States she has a history of arthritis. Asprin 81 mg daily only

## 2016-05-12 DIAGNOSIS — M0579 Rheumatoid arthritis with rheumatoid factor of multiple sites without organ or systems involvement: Secondary | ICD-10-CM | POA: Diagnosis not present

## 2016-05-23 DIAGNOSIS — M0579 Rheumatoid arthritis with rheumatoid factor of multiple sites without organ or systems involvement: Secondary | ICD-10-CM | POA: Diagnosis not present

## 2016-05-23 DIAGNOSIS — M150 Primary generalized (osteo)arthritis: Secondary | ICD-10-CM | POA: Diagnosis not present

## 2016-05-23 DIAGNOSIS — M542 Cervicalgia: Secondary | ICD-10-CM | POA: Diagnosis not present

## 2016-05-23 DIAGNOSIS — M810 Age-related osteoporosis without current pathological fracture: Secondary | ICD-10-CM | POA: Diagnosis not present

## 2016-06-20 DIAGNOSIS — M542 Cervicalgia: Secondary | ICD-10-CM | POA: Diagnosis not present

## 2016-06-21 DIAGNOSIS — M542 Cervicalgia: Secondary | ICD-10-CM | POA: Diagnosis not present

## 2016-06-23 DIAGNOSIS — M542 Cervicalgia: Secondary | ICD-10-CM | POA: Diagnosis not present

## 2016-06-26 DIAGNOSIS — M542 Cervicalgia: Secondary | ICD-10-CM | POA: Diagnosis not present

## 2016-08-01 DIAGNOSIS — M542 Cervicalgia: Secondary | ICD-10-CM | POA: Diagnosis not present

## 2016-08-01 DIAGNOSIS — M0579 Rheumatoid arthritis with rheumatoid factor of multiple sites without organ or systems involvement: Secondary | ICD-10-CM | POA: Diagnosis not present

## 2016-08-01 DIAGNOSIS — M25512 Pain in left shoulder: Secondary | ICD-10-CM | POA: Diagnosis not present

## 2016-08-08 DIAGNOSIS — G609 Hereditary and idiopathic neuropathy, unspecified: Secondary | ICD-10-CM | POA: Diagnosis not present

## 2016-08-08 DIAGNOSIS — G44221 Chronic tension-type headache, intractable: Secondary | ICD-10-CM | POA: Diagnosis not present

## 2016-08-08 DIAGNOSIS — G250 Essential tremor: Secondary | ICD-10-CM | POA: Diagnosis not present

## 2016-09-05 DIAGNOSIS — M150 Primary generalized (osteo)arthritis: Secondary | ICD-10-CM | POA: Diagnosis not present

## 2016-09-05 DIAGNOSIS — M542 Cervicalgia: Secondary | ICD-10-CM | POA: Diagnosis not present

## 2016-09-05 DIAGNOSIS — M0579 Rheumatoid arthritis with rheumatoid factor of multiple sites without organ or systems involvement: Secondary | ICD-10-CM | POA: Diagnosis not present

## 2016-09-06 ENCOUNTER — Other Ambulatory Visit: Payer: Self-pay | Admitting: Rheumatology

## 2016-09-06 DIAGNOSIS — M542 Cervicalgia: Secondary | ICD-10-CM

## 2016-09-12 DIAGNOSIS — R0602 Shortness of breath: Secondary | ICD-10-CM | POA: Diagnosis not present

## 2016-09-12 DIAGNOSIS — J8489 Other specified interstitial pulmonary diseases: Secondary | ICD-10-CM | POA: Diagnosis not present

## 2016-09-19 ENCOUNTER — Ambulatory Visit
Admission: RE | Admit: 2016-09-19 | Discharge: 2016-09-19 | Disposition: A | Discharge status: Home / Self Care | Payer: PPO | Source: Ambulatory Visit | Attending: Rheumatology | Admitting: Rheumatology

## 2016-09-19 DIAGNOSIS — M542 Cervicalgia: Secondary | ICD-10-CM | POA: Diagnosis not present

## 2016-09-19 DIAGNOSIS — M4802 Spinal stenosis, cervical region: Secondary | ICD-10-CM | POA: Diagnosis not present

## 2016-09-19 DIAGNOSIS — M1288 Other specific arthropathies, not elsewhere classified, other specified site: Secondary | ICD-10-CM | POA: Diagnosis not present

## 2016-09-19 DIAGNOSIS — M50323 Other cervical disc degeneration at C6-C7 level: Secondary | ICD-10-CM | POA: Diagnosis not present

## 2016-09-28 DIAGNOSIS — M542 Cervicalgia: Secondary | ICD-10-CM | POA: Diagnosis not present

## 2016-10-09 DIAGNOSIS — M5030 Other cervical disc degeneration, unspecified cervical region: Secondary | ICD-10-CM | POA: Diagnosis not present

## 2016-10-09 DIAGNOSIS — M5412 Radiculopathy, cervical region: Secondary | ICD-10-CM | POA: Diagnosis not present

## 2016-10-09 DIAGNOSIS — M47812 Spondylosis without myelopathy or radiculopathy, cervical region: Secondary | ICD-10-CM | POA: Diagnosis not present

## 2016-10-20 DIAGNOSIS — E559 Vitamin D deficiency, unspecified: Secondary | ICD-10-CM | POA: Diagnosis not present

## 2016-10-20 DIAGNOSIS — M0609 Rheumatoid arthritis without rheumatoid factor, multiple sites: Secondary | ICD-10-CM | POA: Diagnosis not present

## 2016-10-20 DIAGNOSIS — E784 Other hyperlipidemia: Secondary | ICD-10-CM | POA: Diagnosis not present

## 2016-10-25 DIAGNOSIS — M0609 Rheumatoid arthritis without rheumatoid factor, multiple sites: Secondary | ICD-10-CM | POA: Diagnosis not present

## 2016-10-25 DIAGNOSIS — M810 Age-related osteoporosis without current pathological fracture: Secondary | ICD-10-CM | POA: Diagnosis not present

## 2016-10-25 DIAGNOSIS — M150 Primary generalized (osteo)arthritis: Secondary | ICD-10-CM | POA: Diagnosis not present

## 2016-10-25 DIAGNOSIS — E559 Vitamin D deficiency, unspecified: Secondary | ICD-10-CM | POA: Diagnosis not present

## 2016-10-25 DIAGNOSIS — J8489 Other specified interstitial pulmonary diseases: Secondary | ICD-10-CM | POA: Diagnosis not present

## 2016-10-25 DIAGNOSIS — I2510 Atherosclerotic heart disease of native coronary artery without angina pectoris: Secondary | ICD-10-CM | POA: Diagnosis not present

## 2016-10-25 DIAGNOSIS — D692 Other nonthrombocytopenic purpura: Secondary | ICD-10-CM | POA: Diagnosis not present

## 2016-10-25 DIAGNOSIS — E034 Atrophy of thyroid (acquired): Secondary | ICD-10-CM | POA: Diagnosis not present

## 2016-10-25 DIAGNOSIS — Z0000 Encounter for general adult medical examination without abnormal findings: Secondary | ICD-10-CM | POA: Diagnosis not present

## 2016-10-25 DIAGNOSIS — D638 Anemia in other chronic diseases classified elsewhere: Secondary | ICD-10-CM | POA: Diagnosis not present

## 2016-10-25 DIAGNOSIS — G63 Polyneuropathy in diseases classified elsewhere: Secondary | ICD-10-CM | POA: Diagnosis not present

## 2016-10-25 DIAGNOSIS — E784 Other hyperlipidemia: Secondary | ICD-10-CM | POA: Diagnosis not present

## 2016-10-26 DIAGNOSIS — M542 Cervicalgia: Secondary | ICD-10-CM | POA: Diagnosis not present

## 2016-11-23 DIAGNOSIS — M0579 Rheumatoid arthritis with rheumatoid factor of multiple sites without organ or systems involvement: Secondary | ICD-10-CM | POA: Diagnosis not present

## 2016-11-23 DIAGNOSIS — Z79899 Other long term (current) drug therapy: Secondary | ICD-10-CM | POA: Diagnosis not present

## 2016-11-23 DIAGNOSIS — M150 Primary generalized (osteo)arthritis: Secondary | ICD-10-CM | POA: Diagnosis not present

## 2016-11-23 DIAGNOSIS — M542 Cervicalgia: Secondary | ICD-10-CM | POA: Diagnosis not present

## 2016-11-23 DIAGNOSIS — G629 Polyneuropathy, unspecified: Secondary | ICD-10-CM | POA: Diagnosis not present

## 2016-11-29 DIAGNOSIS — M542 Cervicalgia: Secondary | ICD-10-CM | POA: Diagnosis not present

## 2017-01-02 DIAGNOSIS — M542 Cervicalgia: Secondary | ICD-10-CM | POA: Diagnosis not present

## 2017-01-30 DIAGNOSIS — M19071 Primary osteoarthritis, right ankle and foot: Secondary | ICD-10-CM | POA: Diagnosis not present

## 2017-01-30 DIAGNOSIS — M19072 Primary osteoarthritis, left ankle and foot: Secondary | ICD-10-CM | POA: Diagnosis not present

## 2017-01-30 DIAGNOSIS — M542 Cervicalgia: Secondary | ICD-10-CM | POA: Diagnosis not present

## 2017-03-07 DIAGNOSIS — M542 Cervicalgia: Secondary | ICD-10-CM | POA: Diagnosis not present

## 2017-03-09 DIAGNOSIS — H353131 Nonexudative age-related macular degeneration, bilateral, early dry stage: Secondary | ICD-10-CM | POA: Diagnosis not present

## 2017-03-27 DIAGNOSIS — D638 Anemia in other chronic diseases classified elsewhere: Secondary | ICD-10-CM | POA: Diagnosis not present

## 2017-03-27 DIAGNOSIS — E7849 Other hyperlipidemia: Secondary | ICD-10-CM | POA: Diagnosis not present

## 2017-03-27 DIAGNOSIS — M545 Low back pain: Secondary | ICD-10-CM | POA: Diagnosis not present

## 2017-03-27 DIAGNOSIS — M542 Cervicalgia: Secondary | ICD-10-CM | POA: Diagnosis not present

## 2017-03-28 DIAGNOSIS — M545 Low back pain: Secondary | ICD-10-CM | POA: Diagnosis not present

## 2017-03-29 DIAGNOSIS — G8911 Acute pain due to trauma: Secondary | ICD-10-CM | POA: Diagnosis not present

## 2017-03-29 DIAGNOSIS — S3992XA Unspecified injury of lower back, initial encounter: Secondary | ICD-10-CM | POA: Diagnosis not present

## 2017-03-29 DIAGNOSIS — W19XXXA Unspecified fall, initial encounter: Secondary | ICD-10-CM | POA: Diagnosis not present

## 2017-03-29 DIAGNOSIS — R1030 Lower abdominal pain, unspecified: Secondary | ICD-10-CM | POA: Diagnosis not present

## 2017-03-29 DIAGNOSIS — Y92009 Unspecified place in unspecified non-institutional (private) residence as the place of occurrence of the external cause: Secondary | ICD-10-CM | POA: Diagnosis not present

## 2017-03-29 DIAGNOSIS — S3993XA Unspecified injury of pelvis, initial encounter: Secondary | ICD-10-CM | POA: Diagnosis not present

## 2017-03-29 DIAGNOSIS — M545 Low back pain: Secondary | ICD-10-CM | POA: Diagnosis not present

## 2017-04-04 DIAGNOSIS — M542 Cervicalgia: Secondary | ICD-10-CM | POA: Diagnosis not present

## 2017-04-04 DIAGNOSIS — M545 Low back pain: Secondary | ICD-10-CM | POA: Diagnosis not present

## 2017-04-12 DIAGNOSIS — M545 Low back pain: Secondary | ICD-10-CM | POA: Diagnosis not present

## 2017-04-30 DIAGNOSIS — D692 Other nonthrombocytopenic purpura: Secondary | ICD-10-CM | POA: Diagnosis not present

## 2017-04-30 DIAGNOSIS — M0609 Rheumatoid arthritis without rheumatoid factor, multiple sites: Secondary | ICD-10-CM | POA: Diagnosis not present

## 2017-04-30 DIAGNOSIS — I2510 Atherosclerotic heart disease of native coronary artery without angina pectoris: Secondary | ICD-10-CM | POA: Diagnosis not present

## 2017-04-30 DIAGNOSIS — M5136 Other intervertebral disc degeneration, lumbar region: Secondary | ICD-10-CM | POA: Diagnosis not present

## 2017-04-30 DIAGNOSIS — E034 Atrophy of thyroid (acquired): Secondary | ICD-10-CM | POA: Diagnosis not present

## 2017-04-30 DIAGNOSIS — E559 Vitamin D deficiency, unspecified: Secondary | ICD-10-CM | POA: Diagnosis not present

## 2017-04-30 DIAGNOSIS — J8489 Other specified interstitial pulmonary diseases: Secondary | ICD-10-CM | POA: Diagnosis not present

## 2017-04-30 DIAGNOSIS — E7849 Other hyperlipidemia: Secondary | ICD-10-CM | POA: Diagnosis not present

## 2017-04-30 DIAGNOSIS — D638 Anemia in other chronic diseases classified elsewhere: Secondary | ICD-10-CM | POA: Diagnosis not present

## 2017-04-30 DIAGNOSIS — G63 Polyneuropathy in diseases classified elsewhere: Secondary | ICD-10-CM | POA: Diagnosis not present

## 2017-05-17 DIAGNOSIS — M542 Cervicalgia: Secondary | ICD-10-CM | POA: Diagnosis not present

## 2017-05-28 DIAGNOSIS — M0579 Rheumatoid arthritis with rheumatoid factor of multiple sites without organ or systems involvement: Secondary | ICD-10-CM | POA: Diagnosis not present

## 2017-05-31 DIAGNOSIS — M150 Primary generalized (osteo)arthritis: Secondary | ICD-10-CM | POA: Diagnosis not present

## 2017-05-31 DIAGNOSIS — M0579 Rheumatoid arthritis with rheumatoid factor of multiple sites without organ or systems involvement: Secondary | ICD-10-CM | POA: Diagnosis not present

## 2017-05-31 DIAGNOSIS — M545 Low back pain: Secondary | ICD-10-CM | POA: Diagnosis not present

## 2017-05-31 DIAGNOSIS — M25512 Pain in left shoulder: Secondary | ICD-10-CM | POA: Diagnosis not present

## 2017-06-07 DIAGNOSIS — M542 Cervicalgia: Secondary | ICD-10-CM | POA: Diagnosis not present

## 2017-07-02 DIAGNOSIS — M542 Cervicalgia: Secondary | ICD-10-CM | POA: Diagnosis not present

## 2017-07-02 DIAGNOSIS — M545 Low back pain: Secondary | ICD-10-CM | POA: Diagnosis not present

## 2017-07-30 DIAGNOSIS — M150 Primary generalized (osteo)arthritis: Secondary | ICD-10-CM | POA: Diagnosis not present

## 2017-07-30 DIAGNOSIS — Z79899 Other long term (current) drug therapy: Secondary | ICD-10-CM | POA: Diagnosis not present

## 2017-07-30 DIAGNOSIS — J849 Interstitial pulmonary disease, unspecified: Secondary | ICD-10-CM | POA: Diagnosis not present

## 2017-07-30 DIAGNOSIS — M25512 Pain in left shoulder: Secondary | ICD-10-CM | POA: Diagnosis not present

## 2017-07-30 DIAGNOSIS — M0579 Rheumatoid arthritis with rheumatoid factor of multiple sites without organ or systems involvement: Secondary | ICD-10-CM | POA: Diagnosis not present

## 2017-07-30 DIAGNOSIS — M810 Age-related osteoporosis without current pathological fracture: Secondary | ICD-10-CM | POA: Diagnosis not present

## 2017-09-03 DIAGNOSIS — M542 Cervicalgia: Secondary | ICD-10-CM | POA: Diagnosis not present

## 2017-09-21 DIAGNOSIS — J8489 Other specified interstitial pulmonary diseases: Secondary | ICD-10-CM | POA: Diagnosis not present

## 2017-09-21 DIAGNOSIS — R0602 Shortness of breath: Secondary | ICD-10-CM | POA: Diagnosis not present

## 2017-10-04 DIAGNOSIS — M542 Cervicalgia: Secondary | ICD-10-CM | POA: Diagnosis not present

## 2017-10-04 DIAGNOSIS — M545 Low back pain: Secondary | ICD-10-CM | POA: Diagnosis not present

## 2017-10-25 DIAGNOSIS — D638 Anemia in other chronic diseases classified elsewhere: Secondary | ICD-10-CM | POA: Diagnosis not present

## 2017-10-25 DIAGNOSIS — E559 Vitamin D deficiency, unspecified: Secondary | ICD-10-CM | POA: Diagnosis not present

## 2017-10-25 DIAGNOSIS — E7849 Other hyperlipidemia: Secondary | ICD-10-CM | POA: Diagnosis not present

## 2017-10-29 DIAGNOSIS — E034 Atrophy of thyroid (acquired): Secondary | ICD-10-CM | POA: Diagnosis not present

## 2017-10-29 DIAGNOSIS — M0609 Rheumatoid arthritis without rheumatoid factor, multiple sites: Secondary | ICD-10-CM | POA: Diagnosis not present

## 2017-10-29 DIAGNOSIS — M5136 Other intervertebral disc degeneration, lumbar region: Secondary | ICD-10-CM | POA: Diagnosis not present

## 2017-10-29 DIAGNOSIS — M810 Age-related osteoporosis without current pathological fracture: Secondary | ICD-10-CM | POA: Diagnosis not present

## 2017-10-29 DIAGNOSIS — G63 Polyneuropathy in diseases classified elsewhere: Secondary | ICD-10-CM | POA: Diagnosis not present

## 2017-10-29 DIAGNOSIS — D692 Other nonthrombocytopenic purpura: Secondary | ICD-10-CM | POA: Diagnosis not present

## 2017-10-29 DIAGNOSIS — E559 Vitamin D deficiency, unspecified: Secondary | ICD-10-CM | POA: Diagnosis not present

## 2017-10-29 DIAGNOSIS — I2510 Atherosclerotic heart disease of native coronary artery without angina pectoris: Secondary | ICD-10-CM | POA: Diagnosis not present

## 2017-10-29 DIAGNOSIS — E7849 Other hyperlipidemia: Secondary | ICD-10-CM | POA: Diagnosis not present

## 2017-10-29 DIAGNOSIS — D638 Anemia in other chronic diseases classified elsewhere: Secondary | ICD-10-CM | POA: Diagnosis not present

## 2017-10-29 DIAGNOSIS — M150 Primary generalized (osteo)arthritis: Secondary | ICD-10-CM | POA: Diagnosis not present

## 2017-10-29 DIAGNOSIS — Z0000 Encounter for general adult medical examination without abnormal findings: Secondary | ICD-10-CM | POA: Diagnosis not present

## 2017-10-29 DIAGNOSIS — J8489 Other specified interstitial pulmonary diseases: Secondary | ICD-10-CM | POA: Diagnosis not present

## 2017-10-30 DIAGNOSIS — M542 Cervicalgia: Secondary | ICD-10-CM | POA: Diagnosis not present

## 2017-11-26 DIAGNOSIS — Z780 Asymptomatic menopausal state: Secondary | ICD-10-CM | POA: Diagnosis not present

## 2017-11-29 DIAGNOSIS — M545 Low back pain: Secondary | ICD-10-CM | POA: Diagnosis not present

## 2017-12-06 DIAGNOSIS — M545 Low back pain: Secondary | ICD-10-CM | POA: Diagnosis not present

## 2017-12-11 DIAGNOSIS — M545 Low back pain: Secondary | ICD-10-CM | POA: Diagnosis not present

## 2017-12-11 DIAGNOSIS — M542 Cervicalgia: Secondary | ICD-10-CM | POA: Diagnosis not present

## 2017-12-13 DIAGNOSIS — M545 Low back pain: Secondary | ICD-10-CM | POA: Diagnosis not present

## 2017-12-17 DIAGNOSIS — M545 Low back pain: Secondary | ICD-10-CM | POA: Diagnosis not present

## 2017-12-20 DIAGNOSIS — M545 Low back pain: Secondary | ICD-10-CM | POA: Diagnosis not present

## 2017-12-24 DIAGNOSIS — M545 Low back pain: Secondary | ICD-10-CM | POA: Diagnosis not present

## 2018-01-21 DIAGNOSIS — L309 Dermatitis, unspecified: Secondary | ICD-10-CM | POA: Diagnosis not present

## 2018-01-29 DIAGNOSIS — G629 Polyneuropathy, unspecified: Secondary | ICD-10-CM | POA: Diagnosis not present

## 2018-01-29 DIAGNOSIS — M0579 Rheumatoid arthritis with rheumatoid factor of multiple sites without organ or systems involvement: Secondary | ICD-10-CM | POA: Diagnosis not present

## 2018-01-29 DIAGNOSIS — M150 Primary generalized (osteo)arthritis: Secondary | ICD-10-CM | POA: Diagnosis not present

## 2018-01-29 DIAGNOSIS — M810 Age-related osteoporosis without current pathological fracture: Secondary | ICD-10-CM | POA: Diagnosis not present

## 2018-02-14 DIAGNOSIS — M25512 Pain in left shoulder: Secondary | ICD-10-CM | POA: Diagnosis not present

## 2018-03-22 DIAGNOSIS — H353131 Nonexudative age-related macular degeneration, bilateral, early dry stage: Secondary | ICD-10-CM | POA: Diagnosis not present

## 2018-03-22 DIAGNOSIS — M069 Rheumatoid arthritis, unspecified: Secondary | ICD-10-CM | POA: Diagnosis not present

## 2018-03-22 DIAGNOSIS — Z79899 Other long term (current) drug therapy: Secondary | ICD-10-CM | POA: Diagnosis not present

## 2018-05-02 DIAGNOSIS — E034 Atrophy of thyroid (acquired): Secondary | ICD-10-CM | POA: Diagnosis not present

## 2018-05-02 DIAGNOSIS — D638 Anemia in other chronic diseases classified elsewhere: Secondary | ICD-10-CM | POA: Diagnosis not present

## 2018-05-02 DIAGNOSIS — E7849 Other hyperlipidemia: Secondary | ICD-10-CM | POA: Diagnosis not present

## 2018-05-05 DIAGNOSIS — S92412A Displaced fracture of proximal phalanx of left great toe, initial encounter for closed fracture: Secondary | ICD-10-CM | POA: Diagnosis not present

## 2018-05-05 DIAGNOSIS — M79672 Pain in left foot: Secondary | ICD-10-CM | POA: Diagnosis not present

## 2018-05-05 DIAGNOSIS — M79671 Pain in right foot: Secondary | ICD-10-CM | POA: Diagnosis not present

## 2018-05-08 DIAGNOSIS — E034 Atrophy of thyroid (acquired): Secondary | ICD-10-CM | POA: Diagnosis not present

## 2018-05-08 DIAGNOSIS — M0609 Rheumatoid arthritis without rheumatoid factor, multiple sites: Secondary | ICD-10-CM | POA: Diagnosis not present

## 2018-05-08 DIAGNOSIS — D638 Anemia in other chronic diseases classified elsewhere: Secondary | ICD-10-CM | POA: Diagnosis not present

## 2018-05-08 DIAGNOSIS — D692 Other nonthrombocytopenic purpura: Secondary | ICD-10-CM | POA: Diagnosis not present

## 2018-05-08 DIAGNOSIS — R202 Paresthesia of skin: Secondary | ICD-10-CM | POA: Diagnosis not present

## 2018-05-08 DIAGNOSIS — R51 Headache: Secondary | ICD-10-CM | POA: Diagnosis not present

## 2018-05-08 DIAGNOSIS — E559 Vitamin D deficiency, unspecified: Secondary | ICD-10-CM | POA: Diagnosis not present

## 2018-05-08 DIAGNOSIS — J8489 Other specified interstitial pulmonary diseases: Secondary | ICD-10-CM | POA: Diagnosis not present

## 2018-05-08 DIAGNOSIS — M810 Age-related osteoporosis without current pathological fracture: Secondary | ICD-10-CM | POA: Diagnosis not present

## 2018-05-08 DIAGNOSIS — I2510 Atherosclerotic heart disease of native coronary artery without angina pectoris: Secondary | ICD-10-CM | POA: Diagnosis not present

## 2018-07-29 DIAGNOSIS — M533 Sacrococcygeal disorders, not elsewhere classified: Secondary | ICD-10-CM | POA: Diagnosis not present

## 2018-07-29 DIAGNOSIS — M7071 Other bursitis of hip, right hip: Secondary | ICD-10-CM | POA: Diagnosis not present

## 2018-07-29 DIAGNOSIS — M6790 Unspecified disorder of synovium and tendon, unspecified site: Secondary | ICD-10-CM | POA: Diagnosis not present

## 2018-07-29 DIAGNOSIS — M76899 Other specified enthesopathies of unspecified lower limb, excluding foot: Secondary | ICD-10-CM | POA: Diagnosis not present

## 2018-07-29 DIAGNOSIS — M76891 Other specified enthesopathies of right lower limb, excluding foot: Secondary | ICD-10-CM | POA: Diagnosis not present

## 2018-08-07 ENCOUNTER — Other Ambulatory Visit: Payer: Self-pay | Admitting: Student

## 2018-08-07 DIAGNOSIS — M25551 Pain in right hip: Secondary | ICD-10-CM

## 2018-08-07 DIAGNOSIS — M533 Sacrococcygeal disorders, not elsewhere classified: Secondary | ICD-10-CM

## 2018-08-07 DIAGNOSIS — S3210XA Unspecified fracture of sacrum, initial encounter for closed fracture: Secondary | ICD-10-CM

## 2018-08-08 ENCOUNTER — Other Ambulatory Visit: Payer: Self-pay

## 2018-08-08 ENCOUNTER — Ambulatory Visit
Admission: RE | Admit: 2018-08-08 | Discharge: 2018-08-08 | Disposition: A | Discharge status: Home / Self Care | Payer: PPO | Source: Ambulatory Visit | Attending: Student | Admitting: Student

## 2018-08-08 DIAGNOSIS — F3130 Bipolar disorder, current episode depressed, mild or moderate severity, unspecified: Secondary | ICD-10-CM | POA: Diagnosis not present

## 2018-08-08 DIAGNOSIS — M533 Sacrococcygeal disorders, not elsewhere classified: Secondary | ICD-10-CM

## 2018-08-08 DIAGNOSIS — R5381 Other malaise: Secondary | ICD-10-CM | POA: Diagnosis not present

## 2018-08-08 DIAGNOSIS — M47896 Other spondylosis, lumbar region: Secondary | ICD-10-CM | POA: Diagnosis not present

## 2018-08-08 DIAGNOSIS — R262 Difficulty in walking, not elsewhere classified: Secondary | ICD-10-CM | POA: Diagnosis not present

## 2018-08-08 DIAGNOSIS — M1611 Unilateral primary osteoarthritis, right hip: Secondary | ICD-10-CM | POA: Diagnosis not present

## 2018-08-08 DIAGNOSIS — M25551 Pain in right hip: Secondary | ICD-10-CM | POA: Insufficient documentation

## 2018-08-08 DIAGNOSIS — M6281 Muscle weakness (generalized): Secondary | ICD-10-CM | POA: Diagnosis not present

## 2018-08-08 DIAGNOSIS — S3210XA Unspecified fracture of sacrum, initial encounter for closed fracture: Secondary | ICD-10-CM | POA: Diagnosis not present

## 2018-08-12 DIAGNOSIS — M8448XG Pathological fracture, other site, subsequent encounter for fracture with delayed healing: Secondary | ICD-10-CM | POA: Diagnosis not present

## 2018-08-12 MED ORDER — CEFAZOLIN SODIUM-DEXTROSE 1-4 GM/50ML-% IV SOLN
1.0000 g | Freq: Once | INTRAVENOUS | Status: AC
  Administered 2018-08-13: 1 g via INTRAVENOUS

## 2018-08-13 ENCOUNTER — Ambulatory Visit
Admission: RE | Admit: 2018-08-13 | Discharge: 2018-08-13 | Disposition: A | Discharge status: Home / Self Care | Payer: PPO | Attending: Orthopedic Surgery | Admitting: Orthopedic Surgery

## 2018-08-13 ENCOUNTER — Ambulatory Visit: Payer: PPO

## 2018-08-13 ENCOUNTER — Encounter
Admission: RE | Disposition: A | Discharge status: Home / Self Care | Payer: Self-pay | Source: Home / Self Care | Attending: Orthopedic Surgery

## 2018-08-13 ENCOUNTER — Ambulatory Visit: Payer: PPO | Admitting: Anesthesiology

## 2018-08-13 ENCOUNTER — Encounter: Payer: Self-pay | Admitting: *Deleted

## 2018-08-13 ENCOUNTER — Other Ambulatory Visit: Payer: Self-pay

## 2018-08-13 DIAGNOSIS — Z7982 Long term (current) use of aspirin: Secondary | ICD-10-CM | POA: Insufficient documentation

## 2018-08-13 DIAGNOSIS — Z87891 Personal history of nicotine dependence: Secondary | ICD-10-CM | POA: Insufficient documentation

## 2018-08-13 DIAGNOSIS — M1990 Unspecified osteoarthritis, unspecified site: Secondary | ICD-10-CM | POA: Diagnosis not present

## 2018-08-13 DIAGNOSIS — E559 Vitamin D deficiency, unspecified: Secondary | ICD-10-CM | POA: Diagnosis not present

## 2018-08-13 DIAGNOSIS — M069 Rheumatoid arthritis, unspecified: Secondary | ICD-10-CM | POA: Insufficient documentation

## 2018-08-13 DIAGNOSIS — Z79890 Hormone replacement therapy (postmenopausal): Secondary | ICD-10-CM | POA: Diagnosis not present

## 2018-08-13 DIAGNOSIS — D649 Anemia, unspecified: Secondary | ICD-10-CM | POA: Diagnosis not present

## 2018-08-13 DIAGNOSIS — E039 Hypothyroidism, unspecified: Secondary | ICD-10-CM | POA: Insufficient documentation

## 2018-08-13 DIAGNOSIS — S3210XA Unspecified fracture of sacrum, initial encounter for closed fracture: Secondary | ICD-10-CM | POA: Diagnosis not present

## 2018-08-13 DIAGNOSIS — M8008XA Age-related osteoporosis with current pathological fracture, vertebra(e), initial encounter for fracture: Secondary | ICD-10-CM | POA: Insufficient documentation

## 2018-08-13 DIAGNOSIS — G629 Polyneuropathy, unspecified: Secondary | ICD-10-CM | POA: Insufficient documentation

## 2018-08-13 DIAGNOSIS — Z419 Encounter for procedure for purposes other than remedying health state, unspecified: Secondary | ICD-10-CM

## 2018-08-13 DIAGNOSIS — Z79899 Other long term (current) drug therapy: Secondary | ICD-10-CM | POA: Insufficient documentation

## 2018-08-13 DIAGNOSIS — M8448XA Pathological fracture, other site, initial encounter for fracture: Secondary | ICD-10-CM | POA: Diagnosis not present

## 2018-08-13 HISTORY — DX: Diverticulosis of intestine, part unspecified, without perforation or abscess without bleeding: K57.90

## 2018-08-13 HISTORY — DX: Unspecified osteoarthritis, unspecified site: M19.90

## 2018-08-13 HISTORY — DX: Anemia, unspecified: D64.9

## 2018-08-13 HISTORY — DX: Headache: R51

## 2018-08-13 HISTORY — PX: SACROPLASTY: SHX6797

## 2018-08-13 LAB — CBC
HCT: 35.3 % — ABNORMAL LOW (ref 36.0–46.0)
Hemoglobin: 11.3 g/dL — ABNORMAL LOW (ref 12.0–15.0)
MCH: 29.9 pg (ref 26.0–34.0)
MCHC: 32 g/dL (ref 30.0–36.0)
MCV: 93.4 fL (ref 80.0–100.0)
Platelets: 318 10*3/uL (ref 150–400)
RBC: 3.78 MIL/uL — ABNORMAL LOW (ref 3.87–5.11)
RDW: 13.6 % (ref 11.5–15.5)
WBC: 6.7 10*3/uL (ref 4.0–10.5)
nRBC: 0 % (ref 0.0–0.2)

## 2018-08-13 LAB — BASIC METABOLIC PANEL
Anion gap: 8 (ref 5–15)
BUN: 26 mg/dL — ABNORMAL HIGH (ref 8–23)
CO2: 26 mmol/L (ref 22–32)
Calcium: 8.9 mg/dL (ref 8.9–10.3)
Chloride: 105 mmol/L (ref 98–111)
Creatinine, Ser: 0.78 mg/dL (ref 0.44–1.00)
GFR calc Af Amer: >60 mL/min (ref >60)
GFR calc non Af Amer: >60 mL/min (ref >60)
Glucose, Bld: 91 mg/dL (ref 70–99)
Potassium: 3.8 mmol/L (ref 3.5–5.1)
Sodium: 139 mmol/L (ref 135–145)

## 2018-08-13 SURGERY — SACROPLASTY
Anesthesia: General | Site: Back

## 2018-08-13 MED ORDER — FENTANYL CITRATE (PF) 100 MCG/2ML IJ SOLN
INTRAMUSCULAR | Status: DC | PRN
  Administered 2018-08-13 (×2): 12.5 ug via INTRAVENOUS

## 2018-08-13 MED ORDER — METOCLOPRAMIDE HCL 5 MG/ML IJ SOLN: 5.0000 mg | Freq: Three times a day (TID) | INTRAMUSCULAR | Status: DC | PRN

## 2018-08-13 MED ORDER — LIDOCAINE HCL 1 % IJ SOLN
INTRAMUSCULAR | Status: DC | PRN
  Administered 2018-08-13: 10 mL

## 2018-08-13 MED ORDER — ONDANSETRON HCL 4 MG PO TABS: 4.0000 mg | ORAL_TABLET | Freq: Four times a day (QID) | ORAL | Status: DC | PRN

## 2018-08-13 MED ORDER — CEFAZOLIN SODIUM-DEXTROSE 1-4 GM/50ML-% IV SOLN
INTRAVENOUS | Status: AC
  Filled 2018-08-13: qty 50

## 2018-08-13 MED ORDER — PENTAFLUOROPROP-TETRAFLUOROETH EX AERO
INHALATION_SPRAY | CUTANEOUS | Status: AC
  Filled 2018-08-13: qty 116

## 2018-08-13 MED ORDER — PROPOFOL 500 MG/50ML IV EMUL
INTRAVENOUS | Status: AC
  Filled 2018-08-13: qty 50

## 2018-08-13 MED ORDER — FENTANYL CITRATE (PF) 100 MCG/2ML IJ SOLN
INTRAMUSCULAR | Status: AC
  Filled 2018-08-13: qty 2

## 2018-08-13 MED ORDER — PENTAFLUOROPROP-TETRAFLUOROETH EX AERO: INHALATION_SPRAY | CUTANEOUS | Status: DC | PRN

## 2018-08-13 MED ORDER — FAMOTIDINE 20 MG PO TABS
ORAL_TABLET | ORAL | Status: AC
  Filled 2018-08-13: qty 1

## 2018-08-13 MED ORDER — BUPIVACAINE-EPINEPHRINE (PF) 0.5% -1:200000 IJ SOLN
INTRAMUSCULAR | Status: DC | PRN
  Administered 2018-08-13: 20 mL

## 2018-08-13 MED ORDER — PROPOFOL 10 MG/ML IV BOLUS
INTRAVENOUS | Status: DC | PRN
  Administered 2018-08-13 (×3): 10 mg via INTRAVENOUS

## 2018-08-13 MED ORDER — PROPOFOL 500 MG/50ML IV EMUL
INTRAVENOUS | Status: DC | PRN
  Administered 2018-08-13: 50 ug/kg/min via INTRAVENOUS

## 2018-08-13 MED ORDER — OXYCODONE HCL 5 MG PO TABS: 5.0000 mg | ORAL_TABLET | Freq: Once | ORAL | Status: DC | PRN

## 2018-08-13 MED ORDER — SODIUM CHLORIDE 0.9 % IV SOLN
INTRAVENOUS | Status: DC
  Administered 2018-08-13 (×2): via INTRAVENOUS

## 2018-08-13 MED ORDER — METOCLOPRAMIDE HCL 10 MG PO TABS: 5.0000 mg | ORAL_TABLET | Freq: Three times a day (TID) | ORAL | Status: DC | PRN

## 2018-08-13 MED ORDER — ONDANSETRON HCL 4 MG/2ML IJ SOLN: 4.0000 mg | Freq: Four times a day (QID) | INTRAMUSCULAR | Status: DC | PRN

## 2018-08-13 MED ORDER — FAMOTIDINE 20 MG PO TABS
20.0000 mg | ORAL_TABLET | Freq: Once | ORAL | Status: AC
  Administered 2018-08-13: 20 mg via ORAL

## 2018-08-13 MED ORDER — OXYCODONE HCL 5 MG/5ML PO SOLN: 5.0000 mg | Freq: Once | ORAL | Status: DC | PRN

## 2018-08-13 MED ORDER — SODIUM CHLORIDE 0.9 % IV SOLN: INTRAVENOUS | Status: DC

## 2018-08-13 MED ORDER — FENTANYL CITRATE (PF) 100 MCG/2ML IJ SOLN: 25.0000 ug | INTRAMUSCULAR | Status: DC | PRN

## 2018-08-13 SURGICAL SUPPLY — 23 items
BNDG ADH 2 X3.75 FABRIC TAN LF (GAUZE/BANDAGES/DRESSINGS) ×2 IMPLANT
CEMENT KYPHON CX01A KIT/MIXER (Cement) ×2 IMPLANT
COVER WAND RF STERILE (DRAPES) IMPLANT
DERMABOND ADVANCED (GAUZE/BANDAGES/DRESSINGS) ×1
DERMABOND ADVANCED .7 DNX12 (GAUZE/BANDAGES/DRESSINGS) ×1 IMPLANT
DEVICE BIOPSY BONE KYPHX (INSTRUMENTS) ×2 IMPLANT
DRAPE C-ARM XRAY 36X54 (DRAPES) ×2 IMPLANT
DURAPREP 26ML APPLICATOR (WOUND CARE) ×2 IMPLANT
GLOVE SURG SYN 9.0  PF PI (GLOVE) ×3
GLOVE SURG SYN 9.0 PF PI (GLOVE) ×3 IMPLANT
GOWN SRG 2XL LVL 4 RGLN SLV (GOWNS) ×1 IMPLANT
GOWN STRL NON-REIN 2XL LVL4 (GOWNS) ×1
GOWN STRL REUS W/ TWL LRG LVL3 (GOWN DISPOSABLE) ×2 IMPLANT
GOWN STRL REUS W/TWL LRG LVL3 (GOWN DISPOSABLE) ×2
KIT OSTEOCOOL BONE ACCESS 10G (MISCELLANEOUS) ×4 IMPLANT
PACK KYPHOPLASTY (MISCELLANEOUS) ×2 IMPLANT
RENTAL RFA GENERATOR (MISCELLANEOUS) IMPLANT
STRAP SAFETY 5IN WIDE (MISCELLANEOUS) ×4 IMPLANT
SYS CARTRIDGE BONE CEMENT 8ML (SYSTAGENIX WOUND MANAGEMENT) ×2
SYSTEM CARTRIDG BONE CEMNT 8ML (SYSTAGENIX WOUND MANAGEMENT) ×1 IMPLANT
SYSTEM GUN BONE FILLER SZ2 (MISCELLANEOUS) ×2 IMPLANT
TRAY KYPHOPAK 15/3 EXPRESS 1ST (MISCELLANEOUS) ×2 IMPLANT
TRAY KYPHOPAK 20/3 EXPRESS 1ST (MISCELLANEOUS) ×2 IMPLANT

## 2018-08-13 NOTE — Discharge Instructions (Addendum)
AMBULATORY SURGERY  DISCHARGE INSTRUCTIONS   1) The drugs that you were given will stay in your system until tomorrow so for the next 24 hours you should not:  A) Drive an automobile B) Make any legal decisions C) Drink any alcoholic beverage   2) You may resume regular meals tomorrow.  Today it is better to start with liquids and gradually work up to solid foods.  You may eat anything you prefer, but it is better to start with liquids, then soup and crackers, and gradually work up to solid foods.   3) Please notify your doctor immediately if you have any unusual bleeding, trouble breathing, redness and pain at the surgery site, drainage, fever, or pain not relieved by medication.    Please call to schedule your post-operative visit.  4) Additional Instructions:      Take it easy today and tomorrow and resume more normal activities on Thursday.  Remove Band-Aids on Thursday then okay to shower.  Call office if pain worsens.[phone]

## 2018-08-13 NOTE — Anesthesia Post-op Follow-up Note (Signed)
Anesthesia QCDR form completed.        

## 2018-08-13 NOTE — Anesthesia Procedure Notes (Signed)
Procedure Name: MAC Date/Time: 08/13/2018 9:10 AM Performed by: Allean Found, CRNA Pre-anesthesia Checklist: Patient identified, Emergency Drugs available, Suction available, Patient being monitored and Timeout performed Oxygen Delivery Method: Nasal cannula Placement Confirmation: positive ETCO2

## 2018-08-13 NOTE — Anesthesia Postprocedure Evaluation (Signed)
Anesthesia Post Note  Patient: Emma Kennedy  Procedure(s) Performed: SACROPLASTY (N/A Back)  Patient location during evaluation: PACU Anesthesia Type: General Level of consciousness: awake and alert Pain management: pain level controlled Vital Signs Assessment: post-procedure vital signs reviewed and stable Respiratory status: spontaneous breathing, nonlabored ventilation, respiratory function stable and patient connected to nasal cannula oxygen Cardiovascular status: blood pressure returned to baseline and stable Postop Assessment: no apparent nausea or vomiting Anesthetic complications: no     Last Vitals:  Vitals:   08/13/18 1018 08/13/18 1040  BP: (!) 158/56 (!) 158/81  Pulse: (!) 56 (!) 58  Resp: 11   Temp: (!) 36.1 C 37.2 C  SpO2: 100% 100%    Last Pain:  Vitals:   08/13/18 1040  TempSrc: Temporal  PainSc: 6                  Precious Haws Randolf Sansoucie

## 2018-08-13 NOTE — Transfer of Care (Signed)
Immediate Anesthesia Transfer of Care Note  Patient: Emma Kennedy  Procedure(s) Performed: SACROPLASTY (N/A Back)  Patient Location: PACU  Anesthesia Type:MAC  Level of Consciousness: awake  Airway & Oxygen Therapy: Patient Spontanous Breathing and Patient connected to nasal cannula oxygen  Post-op Assessment: Report given to RN and Post -op Vital signs reviewed and stable  Post vital signs: Reviewed and stable  Last Vitals:  Vitals Value Taken Time  BP 126/69 08/13/2018  9:49 AM  Temp 36.1 C 08/13/2018  9:49 AM  Pulse 58 08/13/2018  9:50 AM  Resp 22 08/13/2018  9:50 AM  SpO2 100 % 08/13/2018  9:50 AM  Vitals shown include unvalidated device data.  Last Pain:  Vitals:   08/13/18 0645  TempSrc: Temporal  PainSc: 8          Complications: No apparent anesthesia complications

## 2018-08-13 NOTE — H&P (Signed)
Reviewed paper H+P, will be scanned into chart. No changes noted.  

## 2018-08-13 NOTE — Anesthesia Preprocedure Evaluation (Signed)
Anesthesia Evaluation  Patient identified by MRN, date of birth, ID band Patient awake    Reviewed: Allergy & Precautions, H&P , NPO status , Patient's Chart, lab work & pertinent test results  History of Anesthesia Complications Negative for: history of anesthetic complications  Airway Mallampati: III  TM Distance: <3 FB Neck ROM: limited    Dental  (+) Poor Dentition, Missing, Edentulous Upper, Edentulous Lower, Upper Dentures, Lower Dentures   Pulmonary neg shortness of breath, former smoker,           Cardiovascular Exercise Tolerance: Good (-) angina(-) Past MI and (-) DOE negative cardio ROS       Neuro/Psych  Headaches, negative psych ROS   GI/Hepatic negative GI ROS, Neg liver ROS, neg GERD  ,  Endo/Other  negative endocrine ROS  Renal/GU negative Renal ROS  negative genitourinary   Musculoskeletal  (+) Arthritis ,   Abdominal   Peds  Hematology negative hematology ROS (+)   Anesthesia Other Findings Past Medical History: 2005: Anemia No date: Arthritis No date: Diverticulosis 2013: Headache No date: Osteoarthritis No date: Osteoporosis No date: Thyroid disease  Past Surgical History: No date: ABDOMINAL HYSTERECTOMY No date: APPENDECTOMY No date: bilateral foot surgery  No date: BLADDER SURGERY     Comment:  bladder tack x 2 1998: COLON SURGERY     Comment:  removal of part of colon  No date: EYE SURGERY  BMI    Body Mass Index:  19.18 kg/m      Reproductive/Obstetrics negative OB ROS                             Anesthesia Physical Anesthesia Plan  ASA: III  Anesthesia Plan: General   Post-op Pain Management:    Induction: Intravenous  PONV Risk Score and Plan: Propofol infusion and TIVA  Airway Management Planned: Natural Airway and Nasal Cannula  Additional Equipment:   Intra-op Plan:   Post-operative Plan:   Informed Consent: I have reviewed  the patients History and Physical, chart, labs and discussed the procedure including the risks, benefits and alternatives for the proposed anesthesia with the patient or authorized representative who has indicated his/her understanding and acceptance.     Dental Advisory Given  Plan Discussed with: Anesthesiologist, CRNA and Surgeon  Anesthesia Plan Comments: (Patient consented for risks of anesthesia including but not limited to:  - adverse reactions to medications - risk of intubation if required - damage to teeth, lips or other oral mucosa - sore throat or hoarseness - Damage to heart, brain, lungs or loss of life  Patient voiced understanding.)        Anesthesia Quick Evaluation

## 2018-08-13 NOTE — Op Note (Signed)
08/13/2018  9:53 AM  PATIENT:  Emma Kennedy  85 y.o. female  PRE-OPERATIVE DIAGNOSIS:  SACRAL INSUFFICIENCY FRACTURE  POST-OPERATIVE DIAGNOSIS:  SACRAL INSUFFICIENCY FRACTURE  PROCEDURE:  Procedure(s): SACROPLASTY (N/A)  SURGEON: Laurene Footman, MD  ASSISTANTS: None  ANESTHESIA:   local and MAC  EBL:  Total I/O In: 600 [I.V.:500; IV Piggyback:100] Out: -   BLOOD ADMINISTERED:none  DRAINS: none   LOCAL MEDICATIONS USED:  MARCAINE    and LIDOCAINE   SPECIMEN:  No Specimen  DISPOSITION OF SPECIMEN:  N/A  COUNTS:  YES  TOURNIQUET:  * No tourniquets in log *  IMPLANTS: Bone cement  DICTATION: .Dragon Dictation patient was brought to the operating room and after adequate sedation was given patient was placed prone on the OR table in C arm was brought in to get good visualization of the sacrum in both AP and lateral projections.  After patient identification and timeout procedures were completed, local anesthetic was infiltrated in the area of the planned incisions.  Following this the back was prepped and draped in the usual sterile fashion and repeat timeout procedure carried out.  Spinal needles were then used to get local anesthetic down to the sacrum on both sides checking on the image to make sure we are staying between the sacral foramina and SI joint.  After allowing this local to set small incisions were made and the 15/2 trochars were advanced into the sacrum checking frequently on AP and lateral imaging to stay out of the SI joint and neuroforamen.  When at the appropriate level at the level of S1 cement was mixed and then infiltrated into the each side respectively the right side first with oblique views to see the SI joint better.  Approximately 2 cc were placed on both sides there did not appear to be extravasation into the neuroforamen or SI joint.  After the cement was set the trochars removed and the skin closed with Dermabond followed by Band-Aids  PLAN OF CARE:  Discharge to home after PACU  PATIENT DISPOSITION:  PACU - hemodynamically stable.

## 2018-08-13 NOTE — Progress Notes (Signed)
Ch met w/ pt in pre-op. Pt shared that she injured her back in her garage. Pt seemed to be in pain but was thankful that she was able to get the surgery so soon. Pt has the support of her daughters that share the care for her at home. Ch practiced active listening as the pt share that she has been praying for this procedure and is confident it will go well. Ch provided words of comfort before ending visit.     08/13/18 0800  Clinical Encounter Type  Visited With Patient  Visit Type Psychological support;Social support;Spiritual support;Pre-op  Spiritual Encounters  Spiritual Needs Emotional;Grief support  Stress Factors  Patient Stress Factors Exhausted;Major life changes;Health changes  Family Stress Factors None identified

## 2018-08-28 DIAGNOSIS — M8448XG Pathological fracture, other site, subsequent encounter for fracture with delayed healing: Secondary | ICD-10-CM | POA: Diagnosis not present

## 2018-09-16 DIAGNOSIS — Z79899 Other long term (current) drug therapy: Secondary | ICD-10-CM | POA: Diagnosis not present

## 2018-09-16 DIAGNOSIS — M150 Primary generalized (osteo)arthritis: Secondary | ICD-10-CM | POA: Diagnosis not present

## 2018-09-16 DIAGNOSIS — M0579 Rheumatoid arthritis with rheumatoid factor of multiple sites without organ or systems involvement: Secondary | ICD-10-CM | POA: Diagnosis not present

## 2018-09-19 DIAGNOSIS — Z79899 Other long term (current) drug therapy: Secondary | ICD-10-CM | POA: Diagnosis not present

## 2018-09-19 DIAGNOSIS — G629 Polyneuropathy, unspecified: Secondary | ICD-10-CM | POA: Diagnosis not present

## 2018-09-19 DIAGNOSIS — M810 Age-related osteoporosis without current pathological fracture: Secondary | ICD-10-CM | POA: Diagnosis not present

## 2018-09-19 DIAGNOSIS — R21 Rash and other nonspecific skin eruption: Secondary | ICD-10-CM | POA: Diagnosis not present

## 2018-09-19 DIAGNOSIS — M0579 Rheumatoid arthritis with rheumatoid factor of multiple sites without organ or systems involvement: Secondary | ICD-10-CM | POA: Diagnosis not present

## 2018-10-30 DIAGNOSIS — M810 Age-related osteoporosis without current pathological fracture: Secondary | ICD-10-CM | POA: Diagnosis not present

## 2018-10-30 DIAGNOSIS — E034 Atrophy of thyroid (acquired): Secondary | ICD-10-CM | POA: Diagnosis not present

## 2018-10-30 DIAGNOSIS — Z79899 Other long term (current) drug therapy: Secondary | ICD-10-CM | POA: Diagnosis not present

## 2018-10-30 DIAGNOSIS — R202 Paresthesia of skin: Secondary | ICD-10-CM | POA: Diagnosis not present

## 2018-10-30 DIAGNOSIS — D638 Anemia in other chronic diseases classified elsewhere: Secondary | ICD-10-CM | POA: Diagnosis not present

## 2018-10-30 DIAGNOSIS — M0579 Rheumatoid arthritis with rheumatoid factor of multiple sites without organ or systems involvement: Secondary | ICD-10-CM | POA: Diagnosis not present

## 2018-11-06 DIAGNOSIS — E559 Vitamin D deficiency, unspecified: Secondary | ICD-10-CM | POA: Diagnosis not present

## 2018-11-06 DIAGNOSIS — M5136 Other intervertebral disc degeneration, lumbar region: Secondary | ICD-10-CM | POA: Diagnosis not present

## 2018-11-06 DIAGNOSIS — E034 Atrophy of thyroid (acquired): Secondary | ICD-10-CM | POA: Diagnosis not present

## 2018-11-06 DIAGNOSIS — Z0000 Encounter for general adult medical examination without abnormal findings: Secondary | ICD-10-CM | POA: Diagnosis not present

## 2018-11-06 DIAGNOSIS — J8489 Other specified interstitial pulmonary diseases: Secondary | ICD-10-CM | POA: Diagnosis not present

## 2018-11-06 DIAGNOSIS — M0609 Rheumatoid arthritis without rheumatoid factor, multiple sites: Secondary | ICD-10-CM | POA: Diagnosis not present

## 2018-11-06 DIAGNOSIS — E7849 Other hyperlipidemia: Secondary | ICD-10-CM | POA: Diagnosis not present

## 2018-11-06 DIAGNOSIS — M810 Age-related osteoporosis without current pathological fracture: Secondary | ICD-10-CM | POA: Diagnosis not present

## 2018-11-06 DIAGNOSIS — D692 Other nonthrombocytopenic purpura: Secondary | ICD-10-CM | POA: Diagnosis not present

## 2018-11-06 DIAGNOSIS — G63 Polyneuropathy in diseases classified elsewhere: Secondary | ICD-10-CM | POA: Diagnosis not present

## 2018-11-06 DIAGNOSIS — D638 Anemia in other chronic diseases classified elsewhere: Secondary | ICD-10-CM | POA: Diagnosis not present

## 2018-11-06 DIAGNOSIS — I2510 Atherosclerotic heart disease of native coronary artery without angina pectoris: Secondary | ICD-10-CM | POA: Diagnosis not present

## 2018-11-07 ENCOUNTER — Other Ambulatory Visit: Payer: Self-pay | Admitting: Internal Medicine

## 2018-11-07 ENCOUNTER — Other Ambulatory Visit (HOSPITAL_COMMUNITY): Payer: Self-pay | Admitting: Internal Medicine

## 2018-11-07 DIAGNOSIS — R413 Other amnesia: Secondary | ICD-10-CM

## 2018-11-18 ENCOUNTER — Other Ambulatory Visit: Payer: Self-pay

## 2018-11-18 ENCOUNTER — Ambulatory Visit
Admission: RE | Admit: 2018-11-18 | Discharge: 2018-11-18 | Disposition: A | Discharge status: Home / Self Care | Payer: PPO | Source: Ambulatory Visit | Attending: Internal Medicine | Admitting: Internal Medicine

## 2018-11-18 DIAGNOSIS — R413 Other amnesia: Secondary | ICD-10-CM

## 2018-12-16 DIAGNOSIS — R2689 Other abnormalities of gait and mobility: Secondary | ICD-10-CM | POA: Diagnosis not present

## 2018-12-16 DIAGNOSIS — R413 Other amnesia: Secondary | ICD-10-CM | POA: Diagnosis not present

## 2019-01-10 DIAGNOSIS — J8489 Other specified interstitial pulmonary diseases: Secondary | ICD-10-CM | POA: Diagnosis not present

## 2019-01-10 DIAGNOSIS — R0600 Dyspnea, unspecified: Secondary | ICD-10-CM | POA: Diagnosis not present

## 2019-01-10 DIAGNOSIS — R918 Other nonspecific abnormal finding of lung field: Secondary | ICD-10-CM | POA: Diagnosis not present

## 2019-01-21 DIAGNOSIS — M4847XD Fatigue fracture of vertebra, lumbosacral region, subsequent encounter for fracture with routine healing: Secondary | ICD-10-CM | POA: Diagnosis not present

## 2019-01-21 DIAGNOSIS — R278 Other lack of coordination: Secondary | ICD-10-CM | POA: Diagnosis not present

## 2019-01-21 DIAGNOSIS — M6281 Muscle weakness (generalized): Secondary | ICD-10-CM | POA: Diagnosis not present

## 2019-01-24 DIAGNOSIS — M6281 Muscle weakness (generalized): Secondary | ICD-10-CM | POA: Diagnosis not present

## 2019-01-24 DIAGNOSIS — R278 Other lack of coordination: Secondary | ICD-10-CM | POA: Diagnosis not present

## 2019-01-24 DIAGNOSIS — M4847XD Fatigue fracture of vertebra, lumbosacral region, subsequent encounter for fracture with routine healing: Secondary | ICD-10-CM | POA: Diagnosis not present

## 2019-01-27 DIAGNOSIS — M4847XD Fatigue fracture of vertebra, lumbosacral region, subsequent encounter for fracture with routine healing: Secondary | ICD-10-CM | POA: Diagnosis not present

## 2019-01-27 DIAGNOSIS — R278 Other lack of coordination: Secondary | ICD-10-CM | POA: Diagnosis not present

## 2019-01-27 DIAGNOSIS — M6281 Muscle weakness (generalized): Secondary | ICD-10-CM | POA: Diagnosis not present

## 2019-01-31 DIAGNOSIS — M6281 Muscle weakness (generalized): Secondary | ICD-10-CM | POA: Diagnosis not present

## 2019-01-31 DIAGNOSIS — R278 Other lack of coordination: Secondary | ICD-10-CM | POA: Diagnosis not present

## 2019-01-31 DIAGNOSIS — M4847XD Fatigue fracture of vertebra, lumbosacral region, subsequent encounter for fracture with routine healing: Secondary | ICD-10-CM | POA: Diagnosis not present

## 2019-02-03 DIAGNOSIS — M6281 Muscle weakness (generalized): Secondary | ICD-10-CM | POA: Diagnosis not present

## 2019-02-03 DIAGNOSIS — R278 Other lack of coordination: Secondary | ICD-10-CM | POA: Diagnosis not present

## 2019-02-03 DIAGNOSIS — M4847XD Fatigue fracture of vertebra, lumbosacral region, subsequent encounter for fracture with routine healing: Secondary | ICD-10-CM | POA: Diagnosis not present

## 2019-02-07 DIAGNOSIS — M6281 Muscle weakness (generalized): Secondary | ICD-10-CM | POA: Diagnosis not present

## 2019-02-07 DIAGNOSIS — M4847XD Fatigue fracture of vertebra, lumbosacral region, subsequent encounter for fracture with routine healing: Secondary | ICD-10-CM | POA: Diagnosis not present

## 2019-02-07 DIAGNOSIS — R278 Other lack of coordination: Secondary | ICD-10-CM | POA: Diagnosis not present

## 2019-02-10 DIAGNOSIS — R278 Other lack of coordination: Secondary | ICD-10-CM | POA: Diagnosis not present

## 2019-02-10 DIAGNOSIS — M6281 Muscle weakness (generalized): Secondary | ICD-10-CM | POA: Diagnosis not present

## 2019-02-10 DIAGNOSIS — M4847XD Fatigue fracture of vertebra, lumbosacral region, subsequent encounter for fracture with routine healing: Secondary | ICD-10-CM | POA: Diagnosis not present

## 2019-02-14 DIAGNOSIS — M4847XD Fatigue fracture of vertebra, lumbosacral region, subsequent encounter for fracture with routine healing: Secondary | ICD-10-CM | POA: Diagnosis not present

## 2019-02-14 DIAGNOSIS — R278 Other lack of coordination: Secondary | ICD-10-CM | POA: Diagnosis not present

## 2019-02-14 DIAGNOSIS — M6281 Muscle weakness (generalized): Secondary | ICD-10-CM | POA: Diagnosis not present

## 2019-02-17 DIAGNOSIS — M4847XD Fatigue fracture of vertebra, lumbosacral region, subsequent encounter for fracture with routine healing: Secondary | ICD-10-CM | POA: Diagnosis not present

## 2019-02-17 DIAGNOSIS — M6281 Muscle weakness (generalized): Secondary | ICD-10-CM | POA: Diagnosis not present

## 2019-02-17 DIAGNOSIS — R278 Other lack of coordination: Secondary | ICD-10-CM | POA: Diagnosis not present

## 2019-02-21 DIAGNOSIS — M4847XD Fatigue fracture of vertebra, lumbosacral region, subsequent encounter for fracture with routine healing: Secondary | ICD-10-CM | POA: Diagnosis not present

## 2019-02-21 DIAGNOSIS — M6281 Muscle weakness (generalized): Secondary | ICD-10-CM | POA: Diagnosis not present

## 2019-02-21 DIAGNOSIS — R278 Other lack of coordination: Secondary | ICD-10-CM | POA: Diagnosis not present

## 2019-02-28 DIAGNOSIS — M4847XD Fatigue fracture of vertebra, lumbosacral region, subsequent encounter for fracture with routine healing: Secondary | ICD-10-CM | POA: Diagnosis not present

## 2019-02-28 DIAGNOSIS — R278 Other lack of coordination: Secondary | ICD-10-CM | POA: Diagnosis not present

## 2019-02-28 DIAGNOSIS — M6281 Muscle weakness (generalized): Secondary | ICD-10-CM | POA: Diagnosis not present

## 2019-03-03 DIAGNOSIS — M4847XD Fatigue fracture of vertebra, lumbosacral region, subsequent encounter for fracture with routine healing: Secondary | ICD-10-CM | POA: Diagnosis not present

## 2019-03-03 DIAGNOSIS — M6281 Muscle weakness (generalized): Secondary | ICD-10-CM | POA: Diagnosis not present

## 2019-03-03 DIAGNOSIS — R278 Other lack of coordination: Secondary | ICD-10-CM | POA: Diagnosis not present

## 2019-03-07 DIAGNOSIS — M4847XD Fatigue fracture of vertebra, lumbosacral region, subsequent encounter for fracture with routine healing: Secondary | ICD-10-CM | POA: Diagnosis not present

## 2019-03-07 DIAGNOSIS — R278 Other lack of coordination: Secondary | ICD-10-CM | POA: Diagnosis not present

## 2019-03-07 DIAGNOSIS — M6281 Muscle weakness (generalized): Secondary | ICD-10-CM | POA: Diagnosis not present

## 2019-03-12 DIAGNOSIS — M0579 Rheumatoid arthritis with rheumatoid factor of multiple sites without organ or systems involvement: Secondary | ICD-10-CM | POA: Diagnosis not present

## 2019-03-12 DIAGNOSIS — Z79899 Other long term (current) drug therapy: Secondary | ICD-10-CM | POA: Diagnosis not present

## 2019-03-14 DIAGNOSIS — R278 Other lack of coordination: Secondary | ICD-10-CM | POA: Diagnosis not present

## 2019-03-14 DIAGNOSIS — M4847XD Fatigue fracture of vertebra, lumbosacral region, subsequent encounter for fracture with routine healing: Secondary | ICD-10-CM | POA: Diagnosis not present

## 2019-03-14 DIAGNOSIS — M6281 Muscle weakness (generalized): Secondary | ICD-10-CM | POA: Diagnosis not present

## 2019-03-18 DIAGNOSIS — J8489 Other specified interstitial pulmonary diseases: Secondary | ICD-10-CM | POA: Diagnosis not present

## 2019-03-18 DIAGNOSIS — M810 Age-related osteoporosis without current pathological fracture: Secondary | ICD-10-CM | POA: Diagnosis not present

## 2019-03-18 DIAGNOSIS — M25572 Pain in left ankle and joints of left foot: Secondary | ICD-10-CM | POA: Diagnosis not present

## 2019-03-18 DIAGNOSIS — G629 Polyneuropathy, unspecified: Secondary | ICD-10-CM | POA: Diagnosis not present

## 2019-03-18 DIAGNOSIS — M0579 Rheumatoid arthritis with rheumatoid factor of multiple sites without organ or systems involvement: Secondary | ICD-10-CM | POA: Diagnosis not present

## 2019-03-18 DIAGNOSIS — Z79899 Other long term (current) drug therapy: Secondary | ICD-10-CM | POA: Diagnosis not present

## 2019-03-19 DIAGNOSIS — M6281 Muscle weakness (generalized): Secondary | ICD-10-CM | POA: Diagnosis not present

## 2019-03-19 DIAGNOSIS — R278 Other lack of coordination: Secondary | ICD-10-CM | POA: Diagnosis not present

## 2019-03-19 DIAGNOSIS — M4847XD Fatigue fracture of vertebra, lumbosacral region, subsequent encounter for fracture with routine healing: Secondary | ICD-10-CM | POA: Diagnosis not present

## 2019-03-20 DIAGNOSIS — M069 Rheumatoid arthritis, unspecified: Secondary | ICD-10-CM | POA: Diagnosis not present

## 2019-03-20 DIAGNOSIS — Z79899 Other long term (current) drug therapy: Secondary | ICD-10-CM | POA: Diagnosis not present

## 2019-03-21 DIAGNOSIS — M6281 Muscle weakness (generalized): Secondary | ICD-10-CM | POA: Diagnosis not present

## 2019-03-21 DIAGNOSIS — M4847XD Fatigue fracture of vertebra, lumbosacral region, subsequent encounter for fracture with routine healing: Secondary | ICD-10-CM | POA: Diagnosis not present

## 2019-03-21 DIAGNOSIS — R278 Other lack of coordination: Secondary | ICD-10-CM | POA: Diagnosis not present

## 2019-04-03 DIAGNOSIS — M79672 Pain in left foot: Secondary | ICD-10-CM | POA: Diagnosis not present

## 2019-04-03 DIAGNOSIS — M25572 Pain in left ankle and joints of left foot: Secondary | ICD-10-CM | POA: Diagnosis not present

## 2019-04-07 DIAGNOSIS — R278 Other lack of coordination: Secondary | ICD-10-CM | POA: Diagnosis not present

## 2019-04-07 DIAGNOSIS — M4847XD Fatigue fracture of vertebra, lumbosacral region, subsequent encounter for fracture with routine healing: Secondary | ICD-10-CM | POA: Diagnosis not present

## 2019-04-07 DIAGNOSIS — M6281 Muscle weakness (generalized): Secondary | ICD-10-CM | POA: Diagnosis not present

## 2019-04-11 DIAGNOSIS — M4847XD Fatigue fracture of vertebra, lumbosacral region, subsequent encounter for fracture with routine healing: Secondary | ICD-10-CM | POA: Diagnosis not present

## 2019-04-11 DIAGNOSIS — R278 Other lack of coordination: Secondary | ICD-10-CM | POA: Diagnosis not present

## 2019-04-11 DIAGNOSIS — M6281 Muscle weakness (generalized): Secondary | ICD-10-CM | POA: Diagnosis not present

## 2019-04-15 DIAGNOSIS — R278 Other lack of coordination: Secondary | ICD-10-CM | POA: Diagnosis not present

## 2019-04-15 DIAGNOSIS — M4847XD Fatigue fracture of vertebra, lumbosacral region, subsequent encounter for fracture with routine healing: Secondary | ICD-10-CM | POA: Diagnosis not present

## 2019-04-15 DIAGNOSIS — M6281 Muscle weakness (generalized): Secondary | ICD-10-CM | POA: Diagnosis not present

## 2019-04-17 DIAGNOSIS — M4847XD Fatigue fracture of vertebra, lumbosacral region, subsequent encounter for fracture with routine healing: Secondary | ICD-10-CM | POA: Diagnosis not present

## 2019-04-17 DIAGNOSIS — M6281 Muscle weakness (generalized): Secondary | ICD-10-CM | POA: Diagnosis not present

## 2019-04-17 DIAGNOSIS — R278 Other lack of coordination: Secondary | ICD-10-CM | POA: Diagnosis not present

## 2019-04-22 DIAGNOSIS — M6281 Muscle weakness (generalized): Secondary | ICD-10-CM | POA: Diagnosis not present

## 2019-04-22 DIAGNOSIS — M4847XD Fatigue fracture of vertebra, lumbosacral region, subsequent encounter for fracture with routine healing: Secondary | ICD-10-CM | POA: Diagnosis not present

## 2019-04-22 DIAGNOSIS — R278 Other lack of coordination: Secondary | ICD-10-CM | POA: Diagnosis not present

## 2019-04-29 DIAGNOSIS — M25572 Pain in left ankle and joints of left foot: Secondary | ICD-10-CM | POA: Diagnosis not present

## 2019-04-29 DIAGNOSIS — M0579 Rheumatoid arthritis with rheumatoid factor of multiple sites without organ or systems involvement: Secondary | ICD-10-CM | POA: Diagnosis not present

## 2019-04-29 DIAGNOSIS — G629 Polyneuropathy, unspecified: Secondary | ICD-10-CM | POA: Diagnosis not present

## 2019-05-14 DIAGNOSIS — E7849 Other hyperlipidemia: Secondary | ICD-10-CM | POA: Diagnosis not present

## 2019-05-14 DIAGNOSIS — G63 Polyneuropathy in diseases classified elsewhere: Secondary | ICD-10-CM | POA: Diagnosis not present

## 2019-05-14 DIAGNOSIS — D692 Other nonthrombocytopenic purpura: Secondary | ICD-10-CM | POA: Diagnosis not present

## 2019-05-14 DIAGNOSIS — D638 Anemia in other chronic diseases classified elsewhere: Secondary | ICD-10-CM | POA: Diagnosis not present

## 2019-05-14 DIAGNOSIS — E034 Atrophy of thyroid (acquired): Secondary | ICD-10-CM | POA: Diagnosis not present

## 2019-05-23 DIAGNOSIS — G63 Polyneuropathy in diseases classified elsewhere: Secondary | ICD-10-CM | POA: Diagnosis not present

## 2019-05-23 DIAGNOSIS — E034 Atrophy of thyroid (acquired): Secondary | ICD-10-CM | POA: Diagnosis not present

## 2019-05-23 DIAGNOSIS — D638 Anemia in other chronic diseases classified elsewhere: Secondary | ICD-10-CM | POA: Diagnosis not present

## 2019-05-23 DIAGNOSIS — E7849 Other hyperlipidemia: Secondary | ICD-10-CM | POA: Diagnosis not present

## 2019-05-23 DIAGNOSIS — D692 Other nonthrombocytopenic purpura: Secondary | ICD-10-CM | POA: Diagnosis not present

## 2019-05-23 DIAGNOSIS — M0609 Rheumatoid arthritis without rheumatoid factor, multiple sites: Secondary | ICD-10-CM | POA: Diagnosis not present

## 2019-05-23 DIAGNOSIS — J8489 Other specified interstitial pulmonary diseases: Secondary | ICD-10-CM | POA: Diagnosis not present

## 2019-05-23 DIAGNOSIS — I2510 Atherosclerotic heart disease of native coronary artery without angina pectoris: Secondary | ICD-10-CM | POA: Diagnosis not present

## 2019-05-23 DIAGNOSIS — M5136 Other intervertebral disc degeneration, lumbar region: Secondary | ICD-10-CM | POA: Diagnosis not present

## 2019-05-23 DIAGNOSIS — M810 Age-related osteoporosis without current pathological fracture: Secondary | ICD-10-CM | POA: Diagnosis not present

## 2019-05-23 DIAGNOSIS — J84114 Acute interstitial pneumonitis: Secondary | ICD-10-CM | POA: Diagnosis not present

## 2019-05-23 DIAGNOSIS — F0390 Unspecified dementia without behavioral disturbance: Secondary | ICD-10-CM | POA: Diagnosis not present

## 2019-05-23 DIAGNOSIS — E559 Vitamin D deficiency, unspecified: Secondary | ICD-10-CM | POA: Diagnosis not present

## 2019-05-26 DIAGNOSIS — R2689 Other abnormalities of gait and mobility: Secondary | ICD-10-CM | POA: Diagnosis not present

## 2019-05-26 DIAGNOSIS — R413 Other amnesia: Secondary | ICD-10-CM | POA: Diagnosis not present

## 2019-05-26 DIAGNOSIS — G608 Other hereditary and idiopathic neuropathies: Secondary | ICD-10-CM | POA: Diagnosis not present

## 2019-06-04 DIAGNOSIS — J8489 Other specified interstitial pulmonary diseases: Secondary | ICD-10-CM | POA: Diagnosis not present

## 2019-06-04 DIAGNOSIS — R0600 Dyspnea, unspecified: Secondary | ICD-10-CM | POA: Diagnosis not present

## 2019-07-07 ENCOUNTER — Other Ambulatory Visit: Payer: Self-pay | Admitting: Physician Assistant

## 2019-07-07 ENCOUNTER — Other Ambulatory Visit: Payer: Self-pay

## 2019-07-07 ENCOUNTER — Ambulatory Visit
Admission: RE | Admit: 2019-07-07 | Discharge: 2019-07-07 | Disposition: A | Discharge status: Home / Self Care | Payer: PPO | Source: Ambulatory Visit | Attending: Physician Assistant | Admitting: Physician Assistant

## 2019-07-07 DIAGNOSIS — R413 Other amnesia: Secondary | ICD-10-CM

## 2019-07-07 DIAGNOSIS — R531 Weakness: Secondary | ICD-10-CM | POA: Insufficient documentation

## 2019-07-07 DIAGNOSIS — S0990XA Unspecified injury of head, initial encounter: Secondary | ICD-10-CM | POA: Diagnosis not present

## 2019-07-08 ENCOUNTER — Other Ambulatory Visit: Payer: Self-pay | Admitting: Physician Assistant

## 2019-07-08 DIAGNOSIS — R413 Other amnesia: Secondary | ICD-10-CM

## 2019-07-09 ENCOUNTER — Ambulatory Visit
Admission: RE | Admit: 2019-07-09 | Discharge: 2019-07-09 | Disposition: A | Discharge status: Home / Self Care | Payer: PPO | Source: Ambulatory Visit | Attending: Physician Assistant | Admitting: Physician Assistant

## 2019-07-09 ENCOUNTER — Other Ambulatory Visit: Payer: Self-pay

## 2019-07-09 DIAGNOSIS — R413 Other amnesia: Secondary | ICD-10-CM | POA: Diagnosis not present

## 2019-07-09 DIAGNOSIS — S0990XA Unspecified injury of head, initial encounter: Secondary | ICD-10-CM | POA: Diagnosis not present

## 2019-07-09 DIAGNOSIS — R519 Headache, unspecified: Secondary | ICD-10-CM | POA: Diagnosis not present

## 2019-07-09 MED ORDER — GADOBUTROL 1 MMOL/ML IV SOLN
4.0000 mL | Freq: Once | INTRAVENOUS | Status: AC | PRN
  Administered 2019-07-09: 4 mL via INTRAVENOUS

## 2019-07-21 DIAGNOSIS — R4189 Other symptoms and signs involving cognitive functions and awareness: Secondary | ICD-10-CM | POA: Diagnosis not present

## 2019-07-21 DIAGNOSIS — R2689 Other abnormalities of gait and mobility: Secondary | ICD-10-CM | POA: Diagnosis not present

## 2019-08-14 DIAGNOSIS — M4847XD Fatigue fracture of vertebra, lumbosacral region, subsequent encounter for fracture with routine healing: Secondary | ICD-10-CM | POA: Diagnosis not present

## 2019-08-14 DIAGNOSIS — M6281 Muscle weakness (generalized): Secondary | ICD-10-CM | POA: Diagnosis not present

## 2019-08-14 DIAGNOSIS — R278 Other lack of coordination: Secondary | ICD-10-CM | POA: Diagnosis not present

## 2019-08-17 DIAGNOSIS — R4189 Other symptoms and signs involving cognitive functions and awareness: Secondary | ICD-10-CM | POA: Diagnosis not present

## 2019-08-19 DIAGNOSIS — R278 Other lack of coordination: Secondary | ICD-10-CM | POA: Diagnosis not present

## 2019-08-19 DIAGNOSIS — M6281 Muscle weakness (generalized): Secondary | ICD-10-CM | POA: Diagnosis not present

## 2019-08-19 DIAGNOSIS — M4847XD Fatigue fracture of vertebra, lumbosacral region, subsequent encounter for fracture with routine healing: Secondary | ICD-10-CM | POA: Diagnosis not present

## 2019-08-26 DIAGNOSIS — M6281 Muscle weakness (generalized): Secondary | ICD-10-CM | POA: Diagnosis not present

## 2019-08-26 DIAGNOSIS — R278 Other lack of coordination: Secondary | ICD-10-CM | POA: Diagnosis not present

## 2019-08-26 DIAGNOSIS — M4847XD Fatigue fracture of vertebra, lumbosacral region, subsequent encounter for fracture with routine healing: Secondary | ICD-10-CM | POA: Diagnosis not present

## 2019-09-10 DIAGNOSIS — Z79899 Other long term (current) drug therapy: Secondary | ICD-10-CM | POA: Diagnosis not present

## 2019-09-10 DIAGNOSIS — M0579 Rheumatoid arthritis with rheumatoid factor of multiple sites without organ or systems involvement: Secondary | ICD-10-CM | POA: Diagnosis not present

## 2019-09-15 DIAGNOSIS — M0579 Rheumatoid arthritis with rheumatoid factor of multiple sites without organ or systems involvement: Secondary | ICD-10-CM | POA: Diagnosis not present

## 2019-09-15 DIAGNOSIS — J8489 Other specified interstitial pulmonary diseases: Secondary | ICD-10-CM | POA: Diagnosis not present

## 2019-09-15 DIAGNOSIS — G629 Polyneuropathy, unspecified: Secondary | ICD-10-CM | POA: Diagnosis not present

## 2019-09-15 DIAGNOSIS — M810 Age-related osteoporosis without current pathological fracture: Secondary | ICD-10-CM | POA: Diagnosis not present

## 2019-09-23 DIAGNOSIS — M6281 Muscle weakness (generalized): Secondary | ICD-10-CM | POA: Diagnosis not present

## 2019-09-23 DIAGNOSIS — M4847XD Fatigue fracture of vertebra, lumbosacral region, subsequent encounter for fracture with routine healing: Secondary | ICD-10-CM | POA: Diagnosis not present

## 2019-09-23 DIAGNOSIS — R278 Other lack of coordination: Secondary | ICD-10-CM | POA: Diagnosis not present

## 2019-10-21 DIAGNOSIS — R278 Other lack of coordination: Secondary | ICD-10-CM | POA: Diagnosis not present

## 2019-10-21 DIAGNOSIS — M4847XD Fatigue fracture of vertebra, lumbosacral region, subsequent encounter for fracture with routine healing: Secondary | ICD-10-CM | POA: Diagnosis not present

## 2019-10-21 DIAGNOSIS — M6281 Muscle weakness (generalized): Secondary | ICD-10-CM | POA: Diagnosis not present

## 2019-10-22 DIAGNOSIS — I781 Nevus, non-neoplastic: Secondary | ICD-10-CM | POA: Diagnosis not present

## 2019-10-22 DIAGNOSIS — D0462 Carcinoma in situ of skin of left upper limb, including shoulder: Secondary | ICD-10-CM | POA: Diagnosis not present

## 2019-10-22 DIAGNOSIS — L570 Actinic keratosis: Secondary | ICD-10-CM | POA: Diagnosis not present

## 2019-10-22 DIAGNOSIS — D485 Neoplasm of uncertain behavior of skin: Secondary | ICD-10-CM | POA: Diagnosis not present

## 2019-10-22 DIAGNOSIS — L821 Other seborrheic keratosis: Secondary | ICD-10-CM | POA: Diagnosis not present

## 2019-11-12 DIAGNOSIS — E559 Vitamin D deficiency, unspecified: Secondary | ICD-10-CM | POA: Diagnosis not present

## 2019-11-12 DIAGNOSIS — D638 Anemia in other chronic diseases classified elsewhere: Secondary | ICD-10-CM | POA: Diagnosis not present

## 2019-11-12 DIAGNOSIS — E7849 Other hyperlipidemia: Secondary | ICD-10-CM | POA: Diagnosis not present

## 2019-11-12 DIAGNOSIS — M0609 Rheumatoid arthritis without rheumatoid factor, multiple sites: Secondary | ICD-10-CM | POA: Diagnosis not present

## 2019-11-18 DIAGNOSIS — M6281 Muscle weakness (generalized): Secondary | ICD-10-CM | POA: Diagnosis not present

## 2019-11-18 DIAGNOSIS — M4847XD Fatigue fracture of vertebra, lumbosacral region, subsequent encounter for fracture with routine healing: Secondary | ICD-10-CM | POA: Diagnosis not present

## 2019-11-18 DIAGNOSIS — R278 Other lack of coordination: Secondary | ICD-10-CM | POA: Diagnosis not present

## 2019-11-19 DIAGNOSIS — G63 Polyneuropathy in diseases classified elsewhere: Secondary | ICD-10-CM | POA: Diagnosis not present

## 2019-11-19 DIAGNOSIS — Z0000 Encounter for general adult medical examination without abnormal findings: Secondary | ICD-10-CM | POA: Diagnosis not present

## 2019-11-19 DIAGNOSIS — D638 Anemia in other chronic diseases classified elsewhere: Secondary | ICD-10-CM | POA: Diagnosis not present

## 2019-11-19 DIAGNOSIS — L309 Dermatitis, unspecified: Secondary | ICD-10-CM | POA: Diagnosis not present

## 2019-11-19 DIAGNOSIS — D692 Other nonthrombocytopenic purpura: Secondary | ICD-10-CM | POA: Diagnosis not present

## 2019-11-19 DIAGNOSIS — I2510 Atherosclerotic heart disease of native coronary artery without angina pectoris: Secondary | ICD-10-CM | POA: Diagnosis not present

## 2019-11-19 DIAGNOSIS — E559 Vitamin D deficiency, unspecified: Secondary | ICD-10-CM | POA: Diagnosis not present

## 2019-11-19 DIAGNOSIS — M5136 Other intervertebral disc degeneration, lumbar region: Secondary | ICD-10-CM | POA: Diagnosis not present

## 2019-11-19 DIAGNOSIS — F0390 Unspecified dementia without behavioral disturbance: Secondary | ICD-10-CM | POA: Diagnosis not present

## 2019-11-19 DIAGNOSIS — J8489 Other specified interstitial pulmonary diseases: Secondary | ICD-10-CM | POA: Diagnosis not present

## 2019-11-19 DIAGNOSIS — E7849 Other hyperlipidemia: Secondary | ICD-10-CM | POA: Diagnosis not present

## 2019-11-19 DIAGNOSIS — M0609 Rheumatoid arthritis without rheumatoid factor, multiple sites: Secondary | ICD-10-CM | POA: Diagnosis not present

## 2019-11-20 DIAGNOSIS — H353131 Nonexudative age-related macular degeneration, bilateral, early dry stage: Secondary | ICD-10-CM | POA: Diagnosis not present

## 2019-11-24 DIAGNOSIS — G309 Alzheimer's disease, unspecified: Secondary | ICD-10-CM | POA: Diagnosis not present

## 2019-11-24 DIAGNOSIS — G250 Essential tremor: Secondary | ICD-10-CM | POA: Diagnosis not present

## 2019-11-24 DIAGNOSIS — H539 Unspecified visual disturbance: Secondary | ICD-10-CM | POA: Diagnosis not present

## 2019-11-24 DIAGNOSIS — F0150 Vascular dementia without behavioral disturbance: Secondary | ICD-10-CM | POA: Diagnosis not present

## 2019-11-24 DIAGNOSIS — F0280 Dementia in other diseases classified elsewhere without behavioral disturbance: Secondary | ICD-10-CM | POA: Diagnosis not present

## 2019-11-24 DIAGNOSIS — R2689 Other abnormalities of gait and mobility: Secondary | ICD-10-CM | POA: Diagnosis not present

## 2019-11-24 DIAGNOSIS — G608 Other hereditary and idiopathic neuropathies: Secondary | ICD-10-CM | POA: Diagnosis not present

## 2019-11-27 DIAGNOSIS — M810 Age-related osteoporosis without current pathological fracture: Secondary | ICD-10-CM | POA: Diagnosis not present

## 2019-12-02 DIAGNOSIS — M85872 Other specified disorders of bone density and structure, left ankle and foot: Secondary | ICD-10-CM | POA: Diagnosis not present

## 2019-12-02 DIAGNOSIS — M6281 Muscle weakness (generalized): Secondary | ICD-10-CM | POA: Diagnosis not present

## 2019-12-02 DIAGNOSIS — M19072 Primary osteoarthritis, left ankle and foot: Secondary | ICD-10-CM | POA: Diagnosis not present

## 2019-12-02 DIAGNOSIS — M4847XD Fatigue fracture of vertebra, lumbosacral region, subsequent encounter for fracture with routine healing: Secondary | ICD-10-CM | POA: Diagnosis not present

## 2019-12-02 DIAGNOSIS — M25572 Pain in left ankle and joints of left foot: Secondary | ICD-10-CM | POA: Diagnosis not present

## 2019-12-02 DIAGNOSIS — R278 Other lack of coordination: Secondary | ICD-10-CM | POA: Diagnosis not present

## 2019-12-02 DIAGNOSIS — Z9889 Other specified postprocedural states: Secondary | ICD-10-CM | POA: Diagnosis not present

## 2019-12-02 DIAGNOSIS — M0579 Rheumatoid arthritis with rheumatoid factor of multiple sites without organ or systems involvement: Secondary | ICD-10-CM | POA: Diagnosis not present

## 2019-12-08 ENCOUNTER — Ambulatory Visit: Payer: PPO | Admitting: Dermatology

## 2019-12-16 DIAGNOSIS — R278 Other lack of coordination: Secondary | ICD-10-CM | POA: Diagnosis not present

## 2019-12-16 DIAGNOSIS — M6281 Muscle weakness (generalized): Secondary | ICD-10-CM | POA: Diagnosis not present

## 2019-12-16 DIAGNOSIS — M4847XD Fatigue fracture of vertebra, lumbosacral region, subsequent encounter for fracture with routine healing: Secondary | ICD-10-CM | POA: Diagnosis not present

## 2020-01-01 DIAGNOSIS — E034 Atrophy of thyroid (acquired): Secondary | ICD-10-CM | POA: Diagnosis not present

## 2020-01-06 DIAGNOSIS — R278 Other lack of coordination: Secondary | ICD-10-CM | POA: Diagnosis not present

## 2020-01-06 DIAGNOSIS — M6281 Muscle weakness (generalized): Secondary | ICD-10-CM | POA: Diagnosis not present

## 2020-01-06 DIAGNOSIS — M4847XD Fatigue fracture of vertebra, lumbosacral region, subsequent encounter for fracture with routine healing: Secondary | ICD-10-CM | POA: Diagnosis not present

## 2020-01-20 DIAGNOSIS — R278 Other lack of coordination: Secondary | ICD-10-CM | POA: Diagnosis not present

## 2020-01-20 DIAGNOSIS — M4847XD Fatigue fracture of vertebra, lumbosacral region, subsequent encounter for fracture with routine healing: Secondary | ICD-10-CM | POA: Diagnosis not present

## 2020-01-20 DIAGNOSIS — M6281 Muscle weakness (generalized): Secondary | ICD-10-CM | POA: Diagnosis not present

## 2020-02-03 DIAGNOSIS — M6281 Muscle weakness (generalized): Secondary | ICD-10-CM | POA: Diagnosis not present

## 2020-02-03 DIAGNOSIS — R278 Other lack of coordination: Secondary | ICD-10-CM | POA: Diagnosis not present

## 2020-02-03 DIAGNOSIS — M4847XD Fatigue fracture of vertebra, lumbosacral region, subsequent encounter for fracture with routine healing: Secondary | ICD-10-CM | POA: Diagnosis not present

## 2020-02-07 ENCOUNTER — Emergency Department: Payer: PPO

## 2020-02-07 ENCOUNTER — Other Ambulatory Visit: Payer: Self-pay

## 2020-02-07 ENCOUNTER — Inpatient Hospital Stay: Payer: PPO

## 2020-02-07 ENCOUNTER — Inpatient Hospital Stay
Admission: EM | Admit: 2020-02-07 | Discharge: 2020-02-12 | DRG: 481 | Disposition: A | Discharge status: Skilled Nursing Facility | Payer: PPO | Attending: Obstetrics and Gynecology | Admitting: Obstetrics and Gynecology

## 2020-02-07 ENCOUNTER — Encounter: Payer: Self-pay | Admitting: Internal Medicine

## 2020-02-07 ENCOUNTER — Inpatient Hospital Stay (HOSPITAL_COMMUNITY)
Admit: 2020-02-07 | Discharge: 2020-02-07 | Disposition: A | Discharge status: Home / Self Care | Payer: PPO | Attending: Internal Medicine | Admitting: Internal Medicine

## 2020-02-07 DIAGNOSIS — D649 Anemia, unspecified: Secondary | ICD-10-CM | POA: Diagnosis not present

## 2020-02-07 DIAGNOSIS — Z79899 Other long term (current) drug therapy: Secondary | ICD-10-CM | POA: Diagnosis not present

## 2020-02-07 DIAGNOSIS — Z66 Do not resuscitate: Secondary | ICD-10-CM | POA: Diagnosis not present

## 2020-02-07 DIAGNOSIS — S199XXA Unspecified injury of neck, initial encounter: Secondary | ICD-10-CM | POA: Diagnosis not present

## 2020-02-07 DIAGNOSIS — S72142D Displaced intertrochanteric fracture of left femur, subsequent encounter for closed fracture with routine healing: Secondary | ICD-10-CM | POA: Diagnosis not present

## 2020-02-07 DIAGNOSIS — F0390 Unspecified dementia without behavioral disturbance: Secondary | ICD-10-CM | POA: Diagnosis not present

## 2020-02-07 DIAGNOSIS — I340 Nonrheumatic mitral (valve) insufficiency: Secondary | ICD-10-CM | POA: Diagnosis not present

## 2020-02-07 DIAGNOSIS — Z803 Family history of malignant neoplasm of breast: Secondary | ICD-10-CM | POA: Diagnosis not present

## 2020-02-07 DIAGNOSIS — Z90710 Acquired absence of both cervix and uterus: Secondary | ICD-10-CM | POA: Diagnosis not present

## 2020-02-07 DIAGNOSIS — Z681 Body mass index (BMI) 19 or less, adult: Secondary | ICD-10-CM

## 2020-02-07 DIAGNOSIS — F0280 Dementia in other diseases classified elsewhere without behavioral disturbance: Secondary | ICD-10-CM | POA: Diagnosis not present

## 2020-02-07 DIAGNOSIS — R4182 Altered mental status, unspecified: Secondary | ICD-10-CM | POA: Diagnosis present

## 2020-02-07 DIAGNOSIS — R0689 Other abnormalities of breathing: Secondary | ICD-10-CM | POA: Diagnosis not present

## 2020-02-07 DIAGNOSIS — S72102A Unspecified trochanteric fracture of left femur, initial encounter for closed fracture: Secondary | ICD-10-CM | POA: Diagnosis not present

## 2020-02-07 DIAGNOSIS — M25552 Pain in left hip: Secondary | ICD-10-CM | POA: Diagnosis not present

## 2020-02-07 DIAGNOSIS — M80852A Other osteoporosis with current pathological fracture, left femur, initial encounter for fracture: Secondary | ICD-10-CM | POA: Diagnosis not present

## 2020-02-07 DIAGNOSIS — G309 Alzheimer's disease, unspecified: Secondary | ICD-10-CM | POA: Diagnosis not present

## 2020-02-07 DIAGNOSIS — M6281 Muscle weakness (generalized): Secondary | ICD-10-CM | POA: Diagnosis not present

## 2020-02-07 DIAGNOSIS — S72002A Fracture of unspecified part of neck of left femur, initial encounter for closed fracture: Secondary | ICD-10-CM | POA: Diagnosis not present

## 2020-02-07 DIAGNOSIS — E039 Hypothyroidism, unspecified: Secondary | ICD-10-CM

## 2020-02-07 DIAGNOSIS — R001 Bradycardia, unspecified: Secondary | ICD-10-CM | POA: Diagnosis not present

## 2020-02-07 DIAGNOSIS — K5790 Diverticulosis of intestine, part unspecified, without perforation or abscess without bleeding: Secondary | ICD-10-CM | POA: Diagnosis not present

## 2020-02-07 DIAGNOSIS — I700 Atherosclerosis of aorta: Secondary | ICD-10-CM | POA: Diagnosis not present

## 2020-02-07 DIAGNOSIS — Z801 Family history of malignant neoplasm of trachea, bronchus and lung: Secondary | ICD-10-CM | POA: Diagnosis not present

## 2020-02-07 DIAGNOSIS — M1990 Unspecified osteoarthritis, unspecified site: Secondary | ICD-10-CM | POA: Diagnosis present

## 2020-02-07 DIAGNOSIS — Z20822 Contact with and (suspected) exposure to covid-19: Secondary | ICD-10-CM | POA: Diagnosis present

## 2020-02-07 DIAGNOSIS — Z791 Long term (current) use of non-steroidal anti-inflammatories (NSAID): Secondary | ICD-10-CM

## 2020-02-07 DIAGNOSIS — I071 Rheumatic tricuspid insufficiency: Secondary | ICD-10-CM | POA: Diagnosis present

## 2020-02-07 DIAGNOSIS — S72009A Fracture of unspecified part of neck of unspecified femur, initial encounter for closed fracture: Secondary | ICD-10-CM

## 2020-02-07 DIAGNOSIS — Z87891 Personal history of nicotine dependence: Secondary | ICD-10-CM | POA: Diagnosis not present

## 2020-02-07 DIAGNOSIS — S7292XA Unspecified fracture of left femur, initial encounter for closed fracture: Secondary | ICD-10-CM | POA: Diagnosis present

## 2020-02-07 DIAGNOSIS — R488 Other symbolic dysfunctions: Secondary | ICD-10-CM | POA: Diagnosis not present

## 2020-02-07 DIAGNOSIS — G919 Hydrocephalus, unspecified: Secondary | ICD-10-CM | POA: Diagnosis present

## 2020-02-07 DIAGNOSIS — R55 Syncope and collapse: Secondary | ICD-10-CM | POA: Diagnosis present

## 2020-02-07 DIAGNOSIS — W1830XA Fall on same level, unspecified, initial encounter: Secondary | ICD-10-CM | POA: Diagnosis present

## 2020-02-07 DIAGNOSIS — R410 Disorientation, unspecified: Secondary | ICD-10-CM | POA: Diagnosis not present

## 2020-02-07 DIAGNOSIS — R58 Hemorrhage, not elsewhere classified: Secondary | ICD-10-CM | POA: Diagnosis not present

## 2020-02-07 DIAGNOSIS — M069 Rheumatoid arthritis, unspecified: Secondary | ICD-10-CM | POA: Diagnosis present

## 2020-02-07 DIAGNOSIS — R636 Underweight: Secondary | ICD-10-CM | POA: Diagnosis present

## 2020-02-07 DIAGNOSIS — D62 Acute posthemorrhagic anemia: Secondary | ICD-10-CM | POA: Diagnosis not present

## 2020-02-07 DIAGNOSIS — R1312 Dysphagia, oropharyngeal phase: Secondary | ICD-10-CM | POA: Diagnosis not present

## 2020-02-07 DIAGNOSIS — M810 Age-related osteoporosis without current pathological fracture: Secondary | ICD-10-CM | POA: Diagnosis present

## 2020-02-07 DIAGNOSIS — W19XXXA Unspecified fall, initial encounter: Secondary | ICD-10-CM | POA: Diagnosis not present

## 2020-02-07 DIAGNOSIS — R0902 Hypoxemia: Secondary | ICD-10-CM | POA: Diagnosis not present

## 2020-02-07 DIAGNOSIS — S0101XA Laceration without foreign body of scalp, initial encounter: Secondary | ICD-10-CM | POA: Diagnosis not present

## 2020-02-07 DIAGNOSIS — R2689 Other abnormalities of gait and mobility: Secondary | ICD-10-CM | POA: Diagnosis not present

## 2020-02-07 DIAGNOSIS — S299XXA Unspecified injury of thorax, initial encounter: Secondary | ICD-10-CM | POA: Diagnosis not present

## 2020-02-07 DIAGNOSIS — Z888 Allergy status to other drugs, medicaments and biological substances status: Secondary | ICD-10-CM | POA: Diagnosis not present

## 2020-02-07 DIAGNOSIS — Y92009 Unspecified place in unspecified non-institutional (private) residence as the place of occurrence of the external cause: Secondary | ICD-10-CM | POA: Diagnosis not present

## 2020-02-07 DIAGNOSIS — S72142A Displaced intertrochanteric fracture of left femur, initial encounter for closed fracture: Secondary | ICD-10-CM | POA: Diagnosis present

## 2020-02-07 DIAGNOSIS — Z885 Allergy status to narcotic agent status: Secondary | ICD-10-CM | POA: Diagnosis not present

## 2020-02-07 DIAGNOSIS — R52 Pain, unspecified: Secondary | ICD-10-CM | POA: Diagnosis not present

## 2020-02-07 DIAGNOSIS — Z7982 Long term (current) use of aspirin: Secondary | ICD-10-CM

## 2020-02-07 DIAGNOSIS — R279 Unspecified lack of coordination: Secondary | ICD-10-CM | POA: Diagnosis not present

## 2020-02-07 DIAGNOSIS — I361 Nonrheumatic tricuspid (valve) insufficiency: Secondary | ICD-10-CM | POA: Diagnosis not present

## 2020-02-07 DIAGNOSIS — R5381 Other malaise: Secondary | ICD-10-CM | POA: Diagnosis not present

## 2020-02-07 DIAGNOSIS — I6782 Cerebral ischemia: Secondary | ICD-10-CM | POA: Diagnosis not present

## 2020-02-07 DIAGNOSIS — S0990XA Unspecified injury of head, initial encounter: Secondary | ICD-10-CM | POA: Diagnosis not present

## 2020-02-07 LAB — CBC WITH DIFFERENTIAL/PLATELET
Abs Immature Granulocytes: 0.07 10*3/uL (ref 0.00–0.07)
Basophils Absolute: 0 10*3/uL (ref 0.0–0.1)
Basophils Relative: 1 %
Eosinophils Absolute: 0.1 10*3/uL (ref 0.0–0.5)
Eosinophils Relative: 2 %
HCT: 35.2 % — ABNORMAL LOW (ref 36.0–46.0)
Hemoglobin: 11.4 g/dL — ABNORMAL LOW (ref 12.0–15.0)
Immature Granulocytes: 1 %
Lymphocytes Relative: 22 %
Lymphs Abs: 1.5 10*3/uL (ref 0.7–4.0)
MCH: 31.1 pg (ref 26.0–34.0)
MCHC: 32.4 g/dL (ref 30.0–36.0)
MCV: 95.9 fL (ref 80.0–100.0)
Monocytes Absolute: 0.6 10*3/uL (ref 0.1–1.0)
Monocytes Relative: 9 %
Neutro Abs: 4.6 10*3/uL (ref 1.7–7.7)
Neutrophils Relative %: 65 %
Platelets: 252 10*3/uL (ref 150–400)
RBC: 3.67 MIL/uL — ABNORMAL LOW (ref 3.87–5.11)
RDW: 12.5 % (ref 11.5–15.5)
WBC: 6.9 10*3/uL (ref 4.0–10.5)
nRBC: 0 % (ref 0.0–0.2)

## 2020-02-07 LAB — COMPREHENSIVE METABOLIC PANEL
ALT: 18 U/L (ref 0–44)
AST: 26 U/L (ref 15–41)
Albumin: 3.8 g/dL (ref 3.5–5.0)
Alkaline Phosphatase: 44 U/L (ref 38–126)
Anion gap: 8 (ref 5–15)
BUN: 19 mg/dL (ref 8–23)
CO2: 28 mmol/L (ref 22–32)
Calcium: 9.4 mg/dL (ref 8.9–10.3)
Chloride: 98 mmol/L (ref 98–111)
Creatinine, Ser: 0.82 mg/dL (ref 0.44–1.00)
GFR, Estimated: >60 mL/min (ref >60)
Glucose, Bld: 101 mg/dL — ABNORMAL HIGH (ref 70–99)
Potassium: 3.8 mmol/L (ref 3.5–5.1)
Sodium: 134 mmol/L — ABNORMAL LOW (ref 135–145)
Total Bilirubin: 0.5 mg/dL (ref 0.3–1.2)
Total Protein: 6.6 g/dL (ref 6.5–8.1)

## 2020-02-07 LAB — BRAIN NATRIURETIC PEPTIDE: B Natriuretic Peptide: 68.7 pg/mL (ref 0.0–100.0)

## 2020-02-07 LAB — ECHOCARDIOGRAM COMPLETE
AR max vel: 2.01 cm2
AV Area VTI: 3.33 cm2
AV Area mean vel: 2.25 cm2
AV Mean grad: 3.5 mmHg
AV Peak grad: 6.5 mmHg
Ao pk vel: 1.28 m/s
Area-P 1/2: 1.77 cm2
Height: 59 in
S' Lateral: 2.44 cm
Weight: 1488 oz

## 2020-02-07 LAB — URINALYSIS, COMPLETE (UACMP) WITH MICROSCOPIC
Bacteria, UA: NONE SEEN
Bilirubin Urine: NEGATIVE
Glucose, UA: NEGATIVE mg/dL
Hgb urine dipstick: NEGATIVE
Ketones, ur: NEGATIVE mg/dL
Leukocytes,Ua: NEGATIVE
Nitrite: NEGATIVE
Protein, ur: NEGATIVE mg/dL
Specific Gravity, Urine: 1.011 (ref 1.005–1.030)
pH: 7 (ref 5.0–8.0)

## 2020-02-07 LAB — APTT: aPTT: 27 seconds (ref 24–36)

## 2020-02-07 LAB — PROTIME-INR
INR: 1 (ref 0.8–1.2)
Prothrombin Time: 13 seconds (ref 11.4–15.2)

## 2020-02-07 LAB — ABO/RH: ABO/RH(D): A NEG

## 2020-02-07 LAB — TYPE AND SCREEN
ABO/RH(D): A NEG
Antibody Screen: NEGATIVE

## 2020-02-07 LAB — RESPIRATORY PANEL BY RT PCR (FLU A&B, COVID)
Influenza A by PCR: NEGATIVE
Influenza B by PCR: NEGATIVE
SARS Coronavirus 2 by RT PCR: NEGATIVE

## 2020-02-07 LAB — TROPONIN I (HIGH SENSITIVITY)
Troponin I (High Sensitivity): 10 ng/L (ref <18)
Troponin I (High Sensitivity): 9 ng/L (ref <18)

## 2020-02-07 MED ORDER — HYDROXYCHLOROQUINE SULFATE 200 MG PO TABS
200.0000 mg | ORAL_TABLET | Freq: Every day | ORAL | Status: DC
  Administered 2020-02-08 – 2020-02-09 (×2): 200 mg via ORAL
  Filled 2020-02-07 (×2): qty 1

## 2020-02-07 MED ORDER — CEFAZOLIN SODIUM-DEXTROSE 2-4 GM/100ML-% IV SOLN
2.0000 g | INTRAVENOUS | Status: AC
  Administered 2020-02-08: 1 g via INTRAVENOUS
  Filled 2020-02-07: qty 100

## 2020-02-07 MED ORDER — POLYETHYLENE GLYCOL 3350 17 G PO PACK: 1.0000 | PACK | Freq: Every day | ORAL | Status: DC | PRN

## 2020-02-07 MED ORDER — SULFASALAZINE 500 MG PO TBEC
500.0000 mg | DELAYED_RELEASE_TABLET | Freq: Two times a day (BID) | ORAL | Status: DC
  Administered 2020-02-07 – 2020-02-11 (×9): 500 mg via ORAL
  Filled 2020-02-07 (×13): qty 1

## 2020-02-07 MED ORDER — POLYETHYL GLYCOL-PROPYL GLYCOL 0.4-0.3 % OP GEL
Freq: Two times a day (BID) | OPHTHALMIC | Status: DC | PRN
  Filled 2020-02-07: qty 10

## 2020-02-07 MED ORDER — VITAMIN B-12 1000 MCG PO TABS
5000.0000 ug | ORAL_TABLET | Freq: Every day | ORAL | Status: DC
  Administered 2020-02-07 – 2020-02-12 (×5): 5000 ug via ORAL
  Filled 2020-02-07 (×6): qty 5

## 2020-02-07 MED ORDER — ACETAMINOPHEN 500 MG PO TABS
500.0000 mg | ORAL_TABLET | Freq: Once | ORAL | Status: AC
  Administered 2020-02-07: 500 mg via ORAL
  Filled 2020-02-07: qty 1

## 2020-02-07 MED ORDER — ONDANSETRON HCL 4 MG/2ML IJ SOLN: 4.0000 mg | Freq: Three times a day (TID) | INTRAMUSCULAR | Status: DC | PRN

## 2020-02-07 MED ORDER — FENTANYL CITRATE (PF) 100 MCG/2ML IJ SOLN
25.0000 ug | Freq: Once | INTRAMUSCULAR | Status: AC
  Administered 2020-02-07: 25 ug via INTRAVENOUS
  Filled 2020-02-07: qty 2

## 2020-02-07 MED ORDER — ENSURE ENLIVE PO LIQD
237.0000 mL | Freq: Two times a day (BID) | ORAL | Status: DC
  Administered 2020-02-08 – 2020-02-12 (×7): 237 mL via ORAL

## 2020-02-07 MED ORDER — LEVOTHYROXINE SODIUM 112 MCG PO TABS
112.0000 ug | ORAL_TABLET | Freq: Every day | ORAL | Status: DC
  Administered 2020-02-08 – 2020-02-12 (×5): 112 ug via ORAL
  Filled 2020-02-07 (×6): qty 1

## 2020-02-07 MED ORDER — ACETAMINOPHEN 325 MG PO TABS
650.0000 mg | ORAL_TABLET | Freq: Four times a day (QID) | ORAL | Status: DC | PRN
  Administered 2020-02-07 – 2020-02-11 (×4): 650 mg via ORAL
  Filled 2020-02-07 (×4): qty 2

## 2020-02-07 MED ORDER — DONEPEZIL HCL 5 MG PO TABS
10.0000 mg | ORAL_TABLET | Freq: Every day | ORAL | Status: DC
  Administered 2020-02-07 – 2020-02-11 (×5): 10 mg via ORAL
  Filled 2020-02-07 (×5): qty 2

## 2020-02-07 MED ORDER — SODIUM CHLORIDE 0.9 % IV SOLN: INTRAVENOUS | Status: DC

## 2020-02-07 MED ORDER — METHOCARBAMOL 500 MG PO TABS
500.0000 mg | ORAL_TABLET | Freq: Three times a day (TID) | ORAL | Status: DC | PRN
  Filled 2020-02-07 (×2): qty 1

## 2020-02-07 MED ORDER — ASPIRIN EC 81 MG PO TBEC
81.0000 mg | DELAYED_RELEASE_TABLET | Freq: Every day | ORAL | Status: DC
  Administered 2020-02-07 – 2020-02-12 (×5): 81 mg via ORAL
  Filled 2020-02-07 (×5): qty 1

## 2020-02-07 MED ORDER — DULOXETINE HCL 20 MG PO CPEP
40.0000 mg | ORAL_CAPSULE | Freq: Every day | ORAL | Status: DC
  Administered 2020-02-07 – 2020-02-11 (×5): 40 mg via ORAL
  Filled 2020-02-07 (×6): qty 2

## 2020-02-07 MED ORDER — MORPHINE SULFATE (PF) 2 MG/ML IV SOLN: 1.0000 mg | INTRAVENOUS | Status: DC | PRN

## 2020-02-07 MED ORDER — SENNOSIDES-DOCUSATE SODIUM 8.6-50 MG PO TABS: 1.0000 | ORAL_TABLET | Freq: Every evening | ORAL | Status: DC | PRN

## 2020-02-07 MED ORDER — CEFAZOLIN SODIUM-DEXTROSE 2-4 GM/100ML-% IV SOLN
2.0000 g | INTRAVENOUS | Status: DC
  Filled 2020-02-07: qty 100

## 2020-02-07 MED ORDER — ADULT MULTIVITAMIN W/MINERALS CH
1.0000 | ORAL_TABLET | Freq: Every day | ORAL | Status: DC
  Administered 2020-02-09 – 2020-02-12 (×4): 1 via ORAL
  Filled 2020-02-07 (×4): qty 1

## 2020-02-07 MED ORDER — VITAMIN D 25 MCG (1000 UNIT) PO TABS
1000.0000 [IU] | ORAL_TABLET | Freq: Every day | ORAL | Status: DC
  Administered 2020-02-07 – 2020-02-12 (×5): 1000 [IU] via ORAL
  Filled 2020-02-07 (×5): qty 1

## 2020-02-07 MED ORDER — MIRABEGRON ER 25 MG PO TB24
25.0000 mg | ORAL_TABLET | Freq: Every day | ORAL | Status: DC
  Administered 2020-02-07 – 2020-02-11 (×4): 25 mg via ORAL
  Filled 2020-02-07 (×6): qty 1

## 2020-02-07 MED ORDER — OXYCODONE-ACETAMINOPHEN 5-325 MG PO TABS
1.0000 | ORAL_TABLET | ORAL | Status: DC | PRN
  Administered 2020-02-07: 1 via ORAL
  Filled 2020-02-07: qty 1

## 2020-02-07 MED ORDER — GABAPENTIN 300 MG PO CAPS
300.0000 mg | ORAL_CAPSULE | Freq: Two times a day (BID) | ORAL | Status: DC
  Administered 2020-02-07 – 2020-02-12 (×10): 300 mg via ORAL
  Filled 2020-02-07 (×10): qty 1

## 2020-02-07 NOTE — Social Work (Signed)
TOC CM/SW received order for consult and order has been acknowledged.   This patient is from home. Brought to the [facility] ED after a fall.  Patient lives with her daughter.   CSW team will continue to follow this patient.

## 2020-02-07 NOTE — Anesthesia Preprocedure Evaluation (Addendum)
Anesthesia Evaluation  Patient identified by MRN, date of birth, ID band Patient awake  General Assessment Comment:Answers questions appropriately, aox3  Reviewed: Allergy & Precautions, NPO status , Patient's Chart, lab work & pertinent test results  History of Anesthesia Complications Negative for: history of anesthetic complications  Airway Mallampati: III  TM Distance: >3 FB Neck ROM: Full    Dental  (+) Edentulous Upper, Edentulous Lower   Pulmonary neg pulmonary ROS, neg sleep apnea, neg COPD, Patient abstained from smoking.Not current smoker, former smoker,    Pulmonary exam normal breath sounds clear to auscultation       Cardiovascular Exercise Tolerance: Poor METS(-) hypertension(-) CAD and (-) Past MI negative cardio ROS  (-) dysrhythmias + Valvular Problems/Murmurs  Rhythm:Regular Rate:Normal - Systolic murmurs TTE 77/8242:  1. Left ventricular ejection fraction, by estimation, is 60 to 65%. The  left ventricle has normal function. The left ventricle has no regional  wall motion abnormalities. There is mild left ventricular hypertrophy.  Left ventricular diastolic parameters  are consistent with Grade I diastolic dysfunction (impaired relaxation).  2. Right ventricular systolic function is normal. The right ventricular  size is normal. There is moderately elevated pulmonary artery systolic  pressure. The estimated right ventricular systolic pressure is 35.3 mmHg.  3. Mild mitral valve regurgitation.  4. Tricuspid valve regurgitation is mild to moderate.    Neuro/Psych PSYCHIATRIC DISORDERS Dementia negative neurological ROS     GI/Hepatic neg GERD  ,(+)     (-) substance abuse  ,   Endo/Other  neg diabetesHypothyroidism   Renal/GU negative Renal ROS     Musculoskeletal   Abdominal   Peds  Hematology  (+) Blood dyscrasia, anemia ,   Anesthesia Other Findings Past Medical History: 2005:  Anemia No date: Arthritis No date: Diverticulosis 2013: Headache No date: Osteoarthritis No date: Osteoporosis No date: Thyroid disease   Reproductive/Obstetrics                           Anesthesia Physical Anesthesia Plan  ASA: III  Anesthesia Plan: General/Spinal   Post-op Pain Management:    Induction: Intravenous  PONV Risk Score and Plan: 3 and Ondansetron, Dexamethasone, Propofol infusion, TIVA and Treatment may vary due to age or medical condition  Airway Management Planned: Natural Airway  Additional Equipment: None  Intra-op Plan:   Post-operative Plan:   Informed Consent: I have reviewed the patients History and Physical, chart, labs and discussed the procedure including the risks, benefits and alternatives for the proposed anesthesia with the patient or authorized representative who has indicated his/her understanding and acceptance.       Plan Discussed with: CRNA and Surgeon  Anesthesia Plan Comments: (Discussed R/B/A of neuraxial anesthesia technique with patient, and with daughter at bedside: - rare risks of spinal/epidural hematoma, nerve damage, infection - Risk of PDPH - Risk of nausea and vomiting - Risk of conversion to general anesthesia and its associated risks, including sore throat, damage to lips/teeth/oropharynx, and rare risks such as cardiac and respiratory events.  Patient voiced understanding.)        Anesthesia Quick Evaluation

## 2020-02-07 NOTE — H&P (Addendum)
History and Physical    [name]:[id number] DOB: 02/01/1930 DOA: 02/07/2020  Referring MD/NP/PA:   PCP: Adin Hector, MD   Patient coming from:  The patient is coming from home.  At baseline, pt is partially dependent for most of ADL.        Chief Complaint: fall and left hip pain  HPI: Emma Kennedy is a 86 y.o. female with medical history significant of hypothyroidism, anemia, arthritis, diverticulosis, headache, dementia, who presents with fall and left hip pain.  Per her daughter, it seems the patient fell while using a bedside commode at home. Per her daughter, pt had LOC for approximately 10 seconds. Pt developed pain in her left hip which seems to be constant and severe. Pt had scalp laceration to left parietal area with minimal bleeding.  ED physician put staple to laceration area.  Patient has nausea, no vomiting, diarrhea or abdominal pain.  No symptoms of UTI.  No facial droop or slurred speech.  No unilateral numbness or tingling his extremities.  Patient does not have chest pain, shortness breath, cough.  No fever or chills.  Her mental status is at baseline.  At her normal baseline, patient is ambulatory and able to do dressing and eating by herself.  ED Course: pt was found to have WBC 6.9, troponin level 10, 9,BNP 68.7,  negative urinalysis, negative Covid PCR, electrolytes renal function okay, temperature 97.5, blood pressure 177/106, 118/60, heart rate 60s, RR 24, oxygen saturation 83 to 96%, CT head is negative.  CT of C-spine is negative for bony fracture.  CT of her left hip showed intertrochanteric left femur fracture with comminution.  Patient is admitted to [facility] bed as inpatient.  Dr. Roland Rack of ortho is consutled.  CXR showed: I have personally reviewed 1. Cardiomegaly with diffuse prominence of the pulmonary vasculature and interstitial markings. 2.  Aortic Atherosclerosis  Review of Systems:   General: no fevers, chills, no body weight gain, has  fatigue HEENT: no blurry vision, hearing changes or sore throat Respiratory: no dyspnea, coughing, wheezing CV: no chest pain, no palpitations GI: has nausea, no vomiting, abdominal pain, diarrhea, constipation GU: no dysuria, burning on urination, increased urinary frequency, hematuria  Ext: no leg edema Neuro: no unilateral weakness, numbness, or tingling, no vision change or hearing loss. Has fall and syncope Skin: no rash. Has scalp laceration MSK: has left hip pain Heme: No easy bruising.  Travel history: No recent long distant travel.  Allergy:  Allergies  Allergen Reactions  . Hydrocodone-Acetaminophen Shortness Of Breath, Nausea Only and Other (See Comments)    Causes severe dizziness  . Chloride Other (See Comments)    unknown  . Clinoril [Sulindac] Other (See Comments)    Stomach pain  . Etodolac Other (See Comments)    unknown  . Meloxicam Other (See Comments)    unknown  . Methotrexate Derivatives Other (See Comments)    unknown  . Prednisone Swelling    Ok to take low dose for short amount of time.  . Relafen [Nabumetone] Other (See Comments)    unknown  . Trospium Other (See Comments)    unknown    Past Medical History:  Diagnosis Date  . Anemia 2005  . Arthritis   . Diverticulosis   . Headache 2013  . Osteoarthritis   . Osteoporosis   . Thyroid disease     Past Surgical History:  Procedure Laterality Date  . ABDOMINAL HYSTERECTOMY    . APPENDECTOMY    .  bilateral foot surgery     . BLADDER SURGERY     bladder tack x 2  . COLON SURGERY  1998   removal of part of colon   . EYE SURGERY    . SACROPLASTY N/A 08/13/2018   Procedure: SACROPLASTY;  Surgeon: Hessie Knows, MD;  Location: [facility] ORS;  Service: Orthopedics;  Laterality: N/A;    Social History:  reports that she has quit smoking. Her smoking use included cigarettes. She has a 20.00 pack-year smoking history. She has never used smokeless tobacco. She reports that she does not drink alcohol  and does not use drugs.  Family History:  Family History  Problem Relation Age of Onset  . Breast cancer Sister   . Lung cancer Brother      Prior to Admission medications   Medication Sig Start Date End Date Taking? Authorizing Provider  acetaminophen (TYLENOL) 325 MG tablet Take 2 tablets (650 mg total) by mouth every 6 (six) hours as needed. Patient not taking: Reported on 08/12/2018 05/01/16   Carrie Mew, MD  alendronate (FOSAMAX) 70 MG tablet Take 70 mg by mouth once a week. 08/10/17   [provider]  aspirin 81 MG tablet Take 81 mg by mouth daily.    [provider]  Cholecalciferol (VITAMIN D3) 25 MCG (1000 UT) CAPS Take 1,000 Units by mouth daily.     [provider]  Cyanocobalamin (VITAMIN B-12 PO) Take 5,000 mcg by mouth daily.    [provider]  diclofenac sodium (VOLTAREN) 1 % GEL Apply 2 g topically 4 (four) times daily. 05/01/16   Carrie Mew, MD  fentaNYL (DURAGESIC) 12 MCG/HR Place 1 patch onto the skin every 3 (three) days. 08/07/18   [provider]  gabapentin (NEURONTIN) 300 MG capsule Take 300 mg by mouth 2 (two) times daily.  07/23/13   [provider]  hydroxychloroquine (PLAQUENIL) 200 MG tablet Take 200 mg by mouth daily. 02/27/18   [provider]  ibuprofen (ADVIL,MOTRIN) 800 MG tablet Take 800 mg by mouth 3 (three) times daily after meals. 08/05/18   [provider]  levothyroxine (SYNTHROID, LEVOTHROID) 112 MCG tablet Take 112 mcg by mouth daily before breakfast.  08/11/13   [provider]  Polyethyl Glycol-Propyl Glycol (SYSTANE OP) Place 1 drop into both eyes 2 (two) times daily as needed (dry eyes).     [provider]  polyethylene glycol powder (GLYCOLAX/MIRALAX) powder Take 1 Container by mouth daily as needed for moderate constipation.  08/15/13   [provider]  sulfaSALAzine (AZULFIDINE) 500 MG tablet Take 500 mg by mouth 2 (two) times daily.  08/26/13    [provider]    Physical Exam: Vitals:   02/07/20 0630 02/07/20 0730 02/07/20 0800 02/07/20 0830  BP: 118/60 121/73 121/69 117/70  Pulse: 60 66 62   Resp: 17 14 14  (!) 29  Temp:      TempSrc:      SpO2: 96% 94% 94%   Weight:      Height:       General: Not in acute distress HEENT:       Eyes: PERRL, EOMI, no scleral icterus.       ENT: No discharge from the ears and nose, no pharynx injection, no tonsillar enlargement.        Neck: No JVD, no bruit, no mass felt. Heme: No neck lymph node enlargement. Cardiac: S1/S2, RRR, No murmurs, No gallops or rubs. Respiratory: No rales, wheezing, rhonchi or rubs.  GI: Soft, nondistended, nontender, no rebound pain, no organomegaly, BS present. GU: No hematuria Ext: No pitting leg edema bilaterally. 1+DP/PT pulse bilaterally. Musculoskeletal: has tenderness in left hip. Left leg is externally rotated Skin: No rashes.  Neuro: Alert, oriented to person and place, confused about year, cranial nerves II-XII grossly intact, moves all extremities  Psych: Patient is not psychotic, no suicidal or hemocidal ideation.  Labs on Admission: I have personally reviewed following labs and imaging studies  CBC: Recent Labs  Lab 02/07/20 0332  WBC 6.9  NEUTROABS 4.6  HGB 11.4*  HCT 35.2*  MCV 95.9  PLT 765   Basic Metabolic Panel: Recent Labs  Lab 02/07/20 0332  NA 134*  K 3.8  CL 98  CO2 28  GLUCOSE 101*  BUN 19  CREATININE 0.82  CALCIUM 9.4   GFR: Estimated Creatinine Clearance: 31.6 mL/min (by C-G formula based on SCr of 0.82 mg/dL). Liver Function Tests: Recent Labs  Lab 02/07/20 0332  AST 26  ALT 18  ALKPHOS 44  BILITOT 0.5  PROT 6.6  ALBUMIN 3.8   No results for input(s): LIPASE, AMYLASE in the last 168 hours. No results for input(s): AMMONIA in the last 168 hours. Coagulation Profile: No results for input(s): INR, PROTIME in the last 168 hours. Cardiac Enzymes: No results for input(s): CKTOTAL, CKMB,  CKMBINDEX, TROPONINI in the last 168 hours. BNP (last 3 results) No results for input(s): PROBNP in the last 8760 hours. HbA1C: No results for input(s): HGBA1C in the last 72 hours. CBG: No results for input(s): GLUCAP in the last 168 hours. Lipid Profile: No results for input(s): CHOL, HDL, LDLCALC, TRIG, CHOLHDL, LDLDIRECT in the last 72 hours. Thyroid Function Tests: No results for input(s): TSH, T4TOTAL, FREET4, T3FREE, THYROIDAB in the last 72 hours. Anemia Panel: No results for input(s): VITAMINB12, FOLATE, FERRITIN, TIBC, IRON, RETICCTPCT in the last 72 hours. Urine analysis:    Component Value Date/Time   COLORURINE YELLOW (A) 02/07/2020 0334   APPEARANCEUR CLEAR (A) 02/07/2020 0334   LABSPEC 1.011 02/07/2020 0334   PHURINE 7.0 02/07/2020 0334   GLUCOSEU NEGATIVE 02/07/2020 0334   HGBUR NEGATIVE 02/07/2020 0334   BILIRUBINUR NEGATIVE 02/07/2020 0334   KETONESUR NEGATIVE 02/07/2020 0334   PROTEINUR NEGATIVE 02/07/2020 0334   NITRITE NEGATIVE 02/07/2020 0334   LEUKOCYTESUR NEGATIVE 02/07/2020 0334   Sepsis Labs: @LABRCNTIP (procalcitonin:4,lacticidven:4) ) Recent Results (from the past 240 hour(s))  Respiratory Panel by RT PCR (Flu A&B, Covid) - Nasopharyngeal Swab     Status: None   Collection Time: 02/07/20  3:32 AM   Specimen: Nasopharyngeal Swab  Result Value Ref Range Status   SARS Coronavirus 2 by RT PCR NEGATIVE NEGATIVE Final    Comment: (NOTE) SARS-CoV-2 target nucleic acids are NOT DETECTED.  The SARS-CoV-2 RNA is generally detectable in upper respiratoy specimens during the acute phase of infection. The lowest concentration of SARS-CoV-2 viral copies this assay can detect is 131 copies/mL. A negative result does not preclude SARS-Cov-2 infection and should not be used as the sole basis for treatment or other patient management decisions. A negative result may occur with  improper specimen collection/handling, submission of specimen other than  nasopharyngeal swab, presence of viral mutation(s) within the areas targeted by this assay, and inadequate number of viral copies (<131 copies/mL). A negative result must be combined with clinical observations, patient history, and epidemiological information. The expected result is Negative.  Fact Sheet for Patients:  PinkCheek.be  Fact Sheet for Healthcare Providers:  GravelBags.it  This test is no t yet approved or cleared by the [place] and  has been authorized for detection and/or diagnosis of SARS-CoV-2 by FDA under an Emergency Use Authorization (EUA). This EUA will remain  in effect (meaning this test can be used) for the duration of the COVID-19 declaration under Section 564(b)(1) of the Act, 21 U.S.C. section 360bbb-3(b)(1), unless the authorization is terminated or revoked sooner.     Influenza A by PCR NEGATIVE NEGATIVE Final   Influenza B by PCR NEGATIVE NEGATIVE Final    Comment: (NOTE) The Xpert Xpress SARS-CoV-2/FLU/RSV assay is intended as an aid in  the diagnosis of influenza from Nasopharyngeal swab specimens and  should not be used as a sole basis for treatment. Nasal washings and  aspirates are unacceptable for Xpert Xpress SARS-CoV-2/FLU/RSV  testing.  Fact Sheet for Patients: PinkCheek.be  Fact Sheet for Healthcare Providers: GravelBags.it  This test is not yet approved or cleared by the [place] FDA and  has been authorized for detection and/or diagnosis of SARS-CoV-2 by  FDA under an Emergency Use Authorization (EUA). This EUA will remain  in effect (meaning this test can be used) for the duration of the  Covid-19 declaration under Section 564(b)(1) of the Act, 21  U.S.C. section 360bbb-3(b)(1), unless the authorization is  terminated or revoked. Performed at [facility], [address], [place], [place]  [address]      Radiological Exams on Admission: DG Chest 1 View  Result Date: 02/07/2020 CLINICAL DATA:  Initial evaluation for acute trauma, fall. EXAM: CHEST  1 VIEW COMPARISON:  Prior radiograph from 05/01/2016. FINDINGS: Patient is rotated to the right. Allowing for rotation, mediastinal silhouette within normal limits. Aortic atherosclerosis. Cardiomegaly grossly stable from previous. Lungs mildly hypoinflated. Diffuse prominence of the pulmonary vasculature and interstitial markings, favored to reflect pulmonary interstitial congestion/edema. Sequelae of atypical infection could also have this appearance. No frank airspace consolidation. No pleural effusion. No pneumothorax. No acute osseous finding. Prominent degenerative changes noted about the shoulders. IMPRESSION: 1. Cardiomegaly with diffuse prominence of the pulmonary vasculature and interstitial markings, favored to reflect pulmonary interstitial congestion/edema. Sequelae of atypical infection could also have this appearance. 2.  Aortic Atherosclerosis (ICD10-I70.0). Electronically Signed   By: Jeannine Boga M.D.   On: 02/07/2020 04:01   CT Head Wo Contrast  Result Date: 02/07/2020 CLINICAL DATA:  Initial evaluation for acute trauma, fall. EXAM: CT HEAD WITHOUT CONTRAST TECHNIQUE: Contiguous axial images were obtained from the base of the skull through the vertex without intravenous contrast. COMPARISON:  Prior CT from 07/07/2019. FINDINGS: Brain: Age-related cerebral atrophy with chronic microvascular ischemic disease. No acute intracranial hemorrhage. No acute large vessel territory infarct. No mass lesion, midline shift or mass effect. No hydrocephalus or extra-axial fluid collection. Vascular: No hyperdense vessel. Calcified atherosclerosis present at skull base. Skull: Left parietal scalp contusion/laceration with skin staple in place. No calvarial fracture. Sinuses/Orbits: Globes orbital soft tissues within normal limits. Paranasal  sinuses and mastoid air cells are clear. Other: None. IMPRESSION: 1. No acute intracranial abnormality. 2. Left parietal scalp contusion/laceration with skin staple in place. No calvarial fracture. 3. Age-related cerebral atrophy with chronic microvascular ischemic disease. Electronically Signed   By: Jeannine Boga M.D.   On: 02/07/2020 04:45   CT Cervical Spine Wo Contrast  Result Date: 02/07/2020 CLINICAL DATA:  Initial evaluation for acute trauma, fall. EXAM: CT CERVICAL SPINE WITHOUT CONTRAST TECHNIQUE: Multidetector CT imaging of the cervical spine was performed without intravenous contrast. Multiplanar  CT image reconstructions were also generated. COMPARISON:  None. FINDINGS: Alignment: Vertebral bodies normally aligned with preservation of the normal cervical lordosis. No listhesis or malalignment. Skull base and vertebrae: Skull base intact. Normal C1-2 articulations are preserved in the dens is intact. Vertebral body heights maintained. No acute fracture. Soft tissues and spinal canal: Soft tissues of the neck demonstrate no acute finding. No abnormal prevertebral edema. Spinal canal within normal limits. Disc levels: Prominent degenerative changes noted about the C1-2 articulation. Mild for age multilevel cervical spondylosis without high-grade spinal stenosis. Upper chest: Visualized upper chest demonstrates no acute finding. Irregular pleuroparenchymal thickening noted about the lung apices bilaterally. Other: None. IMPRESSION: No acute traumatic injury within the cervical spine. Electronically Signed   By: Jeannine Boga M.D.   On: 02/07/2020 05:03   DG Pelvis Portable  Result Date: 02/07/2020 CLINICAL DATA:  Fall.  History of dementia. EXAM: PORTABLE PELVIS 1-2 VIEWS COMPARISON:  10/24/2011 CT FINDINGS: Osteopenia. Prior sacralplasty. Femoral heads are located. Skin fold and possible clothing artifact project over the left greater than right femoral neck. Otherwise, no fracture  identified. Sacroiliac joints are grossly symmetric. IMPRESSION: Artifact degraded evaluation of the proximal femurs, especially on the left. If there left hip symptoms, recommend dedicated radiographs. Otherwise, no acute osseous abnormality. Electronically Signed   By: Abigail Miyamoto M.D.   On: 02/07/2020 04:22   CT Hip Left Wo Contrast  Result Date: 02/07/2020 CLINICAL DATA:  Fall with equivocal left hip radiographs. EXAM: CT OF THE LEFT HIP WITHOUT CONTRAST TECHNIQUE: Multidetector CT imaging of the left hip was performed according to the standard protocol. Multiplanar CT image reconstructions were also generated. COMPARISON:  Left hip and pelvic radiographs of earlier today. FINDINGS: Bones/Joint/Cartilage Prior bilateral sacroplasty. Femoral head is located. Moderately comminuted intertrochanteric proximal left femur fracture with mild impaction and override. Significant, grade 2 L5-S1 anterolisthesis with bilateral pars defects. Ligaments Suboptimally assessed by CT. Muscles and Tendons No gross muscular hematoma. Soft tissues Large rectal stool ball.  No well-defined soft tissue hematoma. IMPRESSION: Intertrochanteric left femur fracture with comminution. Bilateral L5 pars defects with grade 2 L5-S1 anterolisthesis. Rectal stool ball suggesting constipation or fecal impaction. Electronically Signed   By: Abigail Miyamoto M.D.   On: 02/07/2020 07:59   DG HIP UNILAT WITH PELVIS 2-3 VIEWS LEFT  Result Date: 02/07/2020 CLINICAL DATA:  Left hip pain after a fall. EXAM: DG HIP (WITH OR WITHOUT PELVIS) 2-3V LEFT COMPARISON:  Earlier today, pelvic films. FINDINGS: AP view the pelvis and AP/frog leg views of the left hip. Osteopenia. Prior sacral plasty. Femoral heads are located. There is again clothing and/or skin fold artifacts projecting over the left proximal femur. No convincing evidence of displaced femur fracture. IMPRESSION: Better, but still somewhat limited evaluation of the proximal left femur/left hip.  Correlate with overlying clothing artifact. If ongoing clinical suspicion of nondisplaced left hip fracture, consider CT. Electronically Signed   By: Abigail Miyamoto M.D.   On: 02/07/2020 05:56     EKG: I have personally reviewed.  Sinus rhythm, QTC 480, LAD, anteroseptal infarction pattern  Assessment/Plan Principal Problem:   Closed left hip fracture (HCC) Active Problems:   RA (rheumatoid arthritis) (HCC)   Normocytic anemia   Hypothyroidism   Fall   Syncope   Dementia without behavioral disturbance ([place])   Closed left hip fracture [facility]): CT of her left hip showed intertrochanteric left femur fracture with comminution. Patient seems to have severe pain. No neurovascular compromise. Orthopedic surgeon, Dr. Roland Rack was  consulted.   - will admit to Med-surg bed - Pain control: morphine prn and percocet - When necessary Zofran for nausea - Robaxin for muscle spasm - Appreciated Dr. consultation - type and cross - INR/PTT -PT/OT when able to (not ordered now)  RA (rheumatoid arthritis) ([place]): -Continue Plaquenil and sulfasalazine  Normocytic anemia: Hemoglobin 11.4 -Follow-up with CBC  Hypothyroidism: -Synthroid  Dementia without behavioral disturbance -Continue donepezil  Syncope and fall: Etiology for her syncope is not clear. The differential diagnosis is broad, including vasovagal syncope, seizure, TIA/stroke, arrhythmia, orthostatic status, AS. Per her daughter, pt was suspected to have possible seizure in the past. - Orthostatic vital signs  - MRI-brain - EEG - 2d echo - IVF: NS 75 cc/h - PT/OT when able to    Perioperative Cardiac Risk: pt has multiple comorbidities, including hypothyroidism, anemia, arthritis, diverticulosis, headache, dementia. At her normal baseline, patient is ambulatory and able to do dressing and eating by herself. She is partially dependent of her ADLs, IADLs.  Patient does not have history of CAD, heart attack or CHF.  Chest x-ray showed  possible interstitial vascular congestion, but BNP 68.7, no leg edema or JVD.  No shortness of breath and chest pain.  No signs of CHF clinically. At this time point, no further work up is needed for cardiac issue. His GUPTA score perioperative myocardial infarction or cardaic arrest is 0.47 %.  I discussed the risk with patient's daughters, they agree to proceed for surgery.  However patient had syncope, will need to work-up for syncope before proceeding surgery. Need to f/u MRI-brain, 2d echo and EEG. If all negative, pt can proceed for surgery.  DVT ppx: SCD Code Status: DNR per her daughter Family Communication: Yes, patient's daughter at bed side Disposition Plan:  Anticipate discharge back to previous environment Consults called:  Dr. Roland Rack Admission status: Med-surg bed as inpt    Status is: Inpatient  Remains inpatient appropriate because:Inpatient level of care appropriate due to severity of illness   Dispo: The patient is from: Home              Anticipated d/c is to: to be determined              Anticipated d/c date is: 2 days              Patient currently is not medically stable to d/c.           Date of Service 02/07/2020    Ivor Costa [facility]   If 7PM-7AM, please contact night-coverage [email or web address] 02/07/2020, 9:46 AM

## 2020-02-07 NOTE — Progress Notes (Signed)
Pt arrived to the unit from the ED with daughter at bedside. Pt and daughter updated on plan of care and oriented to the floor

## 2020-02-07 NOTE — Consult Note (Addendum)
ORTHOPAEDIC CONSULTATION  REQUESTING PHYSICIAN: Ivor Costa, MD  Chief Complaint:   Left hip pain.  History of Present Illness: Emma Kennedy is a 86 y.o. female who lives with her daughter and ambulates independently with a walker. She has a history of dementia, osteoporosis, hypothyroidism, diverticulosis, anemia, and osteoporosis. She was in her usual state of health last evening when she apparently got up to go to the bathroom her bedside commode. The commode tipped over and she fell onto her left hip. Her daughter, who sleeps in the same room with her, was immediately able to get to her. As she was cradling the patient, the patient apparently lost consciousness for about 10 seconds. Apparently she had a second episode of brief loss of consciousness while being brought to the hospital by the EMS crew. The patient did strike her head on the corner of a closet. No other injuries pertaining to the fall were noted. The patient denies any lightheadedness, dizziness, chest pain, shortness of breath, or other symptoms which may have precipitated her fall, although she does have a history of dementia.  Past Medical History:  Diagnosis Date  . Anemia 2005  . Arthritis   . Diverticulosis   . Headache 2013  . Osteoarthritis   . Osteoporosis   . Thyroid disease    Past Surgical History:  Procedure Laterality Date  . ABDOMINAL HYSTERECTOMY    . APPENDECTOMY    . bilateral foot surgery     . BLADDER SURGERY     bladder tack x 2  . COLON SURGERY  1998   removal of part of colon   . EYE SURGERY    . SACROPLASTY N/A 08/13/2018   Procedure: SACROPLASTY;  Surgeon: Hessie Knows, MD;  Location: [facility] ORS;  Service: Orthopedics;  Laterality: N/A;   Social History   Socioeconomic History  . Marital status: Widowed    Spouse name: Not on file  . Number of children: Not on file  . Years of education: Not on file  . Highest education  level: Not on file  Occupational History  . Not on file  Tobacco Use  . Smoking status: Former Smoker    Packs/day: 1.00    Years: 20.00    Pack years: 20.00    Types: Cigarettes  . Smokeless tobacco: Never Used  Vaping Use  . Vaping Use: Never used  Substance and Sexual Activity  . Alcohol use: No  . Drug use: Never  . Sexual activity: Not on file  Other Topics Concern  . Not on file  Social History Narrative  . Not on file   Social Determinants of Health   Financial Resource Strain:   . Difficulty of Paying Living Expenses: Not on file  Food Insecurity:   . Worried About Charity fundraiser in the Last Year: Not on file  . Ran Out of Food in the Last Year: Not on file  Transportation Needs:   . Lack of Transportation (Medical): Not on file  . Lack of Transportation (Non-Medical): Not on file  Physical Activity:   . Days of Exercise per Week: Not on file  . Minutes of Exercise per Session: Not on file  Stress:   . Feeling of Stress : Not on file  Social Connections:   . Frequency of Communication with Friends and Family: Not on file  . Frequency of Social Gatherings with Friends and Family: Not on file  . Attends Religious Services: Not on file  . Active Member  of Clubs or Organizations: Not on file  . Attends Archivist Meetings: Not on file  . Marital Status: Not on file   Family History  Problem Relation Age of Onset  . Breast cancer Sister   . Lung cancer Brother    Allergies  Allergen Reactions  . Hydrocodone-Acetaminophen Shortness Of Breath, Nausea Only and Other (See Comments)    Causes severe dizziness  . Chloride Other (See Comments)    unknown  . Clinoril [Sulindac] Other (See Comments)    Stomach pain  . Etodolac Other (See Comments)    unknown  . Meloxicam Other (See Comments)    unknown  . Methotrexate Derivatives Other (See Comments)    unknown  . Prednisone Swelling    Ok to take low dose for short amount of time.  . Relafen  [Nabumetone] Other (See Comments)    unknown  . Trospium Other (See Comments)    unknown   Prior to Admission medications   Medication Sig Start Date End Date Taking? Authorizing Provider  alendronate (FOSAMAX) 70 MG tablet Take 70 mg by mouth once a week. 08/10/17  Yes [provider]  aspirin 81 MG tablet Take 81 mg by mouth daily.   Yes [provider]  Cholecalciferol (VITAMIN D3) 25 MCG (1000 UT) CAPS Take 1,000 Units by mouth daily.    Yes [provider]  Cyanocobalamin (VITAMIN B-12 PO) Take 5,000 mcg by mouth daily.   Yes [provider]  diclofenac sodium (VOLTAREN) 1 % GEL Apply 2 g topically 4 (four) times daily. 05/01/16  Yes Carrie Mew, MD  donepezil (ARICEPT) 10 MG tablet Take 10 mg by mouth at bedtime. 01/22/20  Yes [provider]  DULoxetine (CYMBALTA) 20 MG capsule Take 40 mg by mouth daily. 01/22/20  Yes [provider]  gabapentin (NEURONTIN) 300 MG capsule Take 300 mg by mouth 2 (two) times daily.  07/23/13  Yes [provider]  hydroxychloroquine (PLAQUENIL) 200 MG tablet Take 200 mg by mouth daily. 02/27/18  Yes [provider]  levothyroxine (SYNTHROID, LEVOTHROID) 112 MCG tablet Take 112 mcg by mouth daily before breakfast.  08/11/13  Yes [provider]  MYRBETRIQ 25 MG TB24 tablet Take 25 mg by mouth daily. 01/22/20  Yes [provider]  Polyethyl Glycol-Propyl Glycol (SYSTANE OP) Place 1 drop into both eyes 2 (two) times daily as needed (dry eyes).    Yes [provider]  polyethylene glycol powder (GLYCOLAX/MIRALAX) powder Take 1 Container by mouth daily as needed for moderate constipation.  08/15/13  Yes [provider]  sulfaSALAzine (AZULFIDINE) 500 MG tablet Take 500 mg by mouth 2 (two) times daily.  08/26/13  Yes [provider]  acetaminophen (TYLENOL) 325 MG tablet Take 2 tablets (650 mg total) by mouth every 6 (six) hours as needed. Patient not  taking: Reported on 08/12/2018 05/01/16   Carrie Mew, MD   DG Chest 1 View  Result Date: 02/07/2020 CLINICAL DATA:  Initial evaluation for acute trauma, fall. EXAM: CHEST  1 VIEW COMPARISON:  Prior radiograph from 05/01/2016. FINDINGS: Patient is rotated to the right. Allowing for rotation, mediastinal silhouette within normal limits. Aortic atherosclerosis. Cardiomegaly grossly stable from previous. Lungs mildly hypoinflated. Diffuse prominence of the pulmonary vasculature and interstitial markings, favored to reflect pulmonary interstitial congestion/edema. Sequelae of atypical infection could also have this appearance. No frank airspace consolidation. No pleural effusion. No pneumothorax. No acute osseous finding. Prominent degenerative changes noted about the shoulders. IMPRESSION: 1.  Cardiomegaly with diffuse prominence of the pulmonary vasculature and interstitial markings, favored to reflect pulmonary interstitial congestion/edema. Sequelae of atypical infection could also have this appearance. 2.  Aortic Atherosclerosis (ICD10-I70.0). Electronically Signed   By: Jeannine Boga M.D.   On: 02/07/2020 04:01   CT Head Wo Contrast  Result Date: 02/07/2020 CLINICAL DATA:  Initial evaluation for acute trauma, fall. EXAM: CT HEAD WITHOUT CONTRAST TECHNIQUE: Contiguous axial images were obtained from the base of the skull through the vertex without intravenous contrast. COMPARISON:  Prior CT from 07/07/2019. FINDINGS: Brain: Age-related cerebral atrophy with chronic microvascular ischemic disease. No acute intracranial hemorrhage. No acute large vessel territory infarct. No mass lesion, midline shift or mass effect. No hydrocephalus or extra-axial fluid collection. Vascular: No hyperdense vessel. Calcified atherosclerosis present at skull base. Skull: Left parietal scalp contusion/laceration with skin staple in place. No calvarial fracture. Sinuses/Orbits: Globes orbital soft tissues within normal  limits. Paranasal sinuses and mastoid air cells are clear. Other: None. IMPRESSION: 1. No acute intracranial abnormality. 2. Left parietal scalp contusion/laceration with skin staple in place. No calvarial fracture. 3. Age-related cerebral atrophy with chronic microvascular ischemic disease. Electronically Signed   By: Jeannine Boga M.D.   On: 02/07/2020 04:45   CT Cervical Spine Wo Contrast  Result Date: 02/07/2020 CLINICAL DATA:  Initial evaluation for acute trauma, fall. EXAM: CT CERVICAL SPINE WITHOUT CONTRAST TECHNIQUE: Multidetector CT imaging of the cervical spine was performed without intravenous contrast. Multiplanar CT image reconstructions were also generated. COMPARISON:  None. FINDINGS: Alignment: Vertebral bodies normally aligned with preservation of the normal cervical lordosis. No listhesis or malalignment. Skull base and vertebrae: Skull base intact. Normal C1-2 articulations are preserved in the dens is intact. Vertebral body heights maintained. No acute fracture. Soft tissues and spinal canal: Soft tissues of the neck demonstrate no acute finding. No abnormal prevertebral edema. Spinal canal within normal limits. Disc levels: Prominent degenerative changes noted about the C1-2 articulation. Mild for age multilevel cervical spondylosis without high-grade spinal stenosis. Upper chest: Visualized upper chest demonstrates no acute finding. Irregular pleuroparenchymal thickening noted about the lung apices bilaterally. Other: None. IMPRESSION: No acute traumatic injury within the cervical spine. Electronically Signed   By: Jeannine Boga M.D.   On: 02/07/2020 05:03   DG Pelvis Portable  Result Date: 02/07/2020 CLINICAL DATA:  Fall.  History of dementia. EXAM: PORTABLE PELVIS 1-2 VIEWS COMPARISON:  10/24/2011 CT FINDINGS: Osteopenia. Prior sacralplasty. Femoral heads are located. Skin fold and possible clothing artifact project over the left greater than right femoral neck.  Otherwise, no fracture identified. Sacroiliac joints are grossly symmetric. IMPRESSION: Artifact degraded evaluation of the proximal femurs, especially on the left. If there left hip symptoms, recommend dedicated radiographs. Otherwise, no acute osseous abnormality. Electronically Signed   By: Abigail Miyamoto M.D.   On: 02/07/2020 04:22   CT Hip Left Wo Contrast  Result Date: 02/07/2020 CLINICAL DATA:  Fall with equivocal left hip radiographs. EXAM: CT OF THE LEFT HIP WITHOUT CONTRAST TECHNIQUE: Multidetector CT imaging of the left hip was performed according to the standard protocol. Multiplanar CT image reconstructions were also generated. COMPARISON:  Left hip and pelvic radiographs of earlier today. FINDINGS: Bones/Joint/Cartilage Prior bilateral sacroplasty. Femoral head is located. Moderately comminuted intertrochanteric proximal left femur fracture with mild impaction and override. Significant, grade 2 L5-S1 anterolisthesis with bilateral pars defects. Ligaments Suboptimally assessed by CT. Muscles and Tendons No gross muscular hematoma. Soft tissues Large rectal stool ball.  No well-defined soft tissue hematoma. IMPRESSION:  Intertrochanteric left femur fracture with comminution. Bilateral L5 pars defects with grade 2 L5-S1 anterolisthesis. Rectal stool ball suggesting constipation or fecal impaction. Electronically Signed   By: Abigail Miyamoto M.D.   On: 02/07/2020 07:59   DG HIP UNILAT WITH PELVIS 2-3 VIEWS LEFT  Result Date: 02/07/2020 CLINICAL DATA:  Left hip pain after a fall. EXAM: DG HIP (WITH OR WITHOUT PELVIS) 2-3V LEFT COMPARISON:  Earlier today, pelvic films. FINDINGS: AP view the pelvis and AP/frog leg views of the left hip. Osteopenia. Prior sacral plasty. Femoral heads are located. There is again clothing and/or skin fold artifacts projecting over the left proximal femur. No convincing evidence of displaced femur fracture. IMPRESSION: Better, but still somewhat limited evaluation of the  proximal left femur/left hip. Correlate with overlying clothing artifact. If ongoing clinical suspicion of nondisplaced left hip fracture, consider CT. Electronically Signed   By: Abigail Miyamoto M.D.   On: 02/07/2020 05:56    Positive ROS: All other systems have been reviewed and were otherwise negative with the exception of those mentioned in the HPI and as above.  Physical Exam: General:  Alert, no acute distress Psychiatric:  Patient is not competent for consent, but exhibits normal mood and affect   Cardiovascular:  No pedal edema Respiratory:  No wheezing, non-labored breathing GI:  Abdomen is soft and non-tender Skin:  No lesions in the area of chief complaint Neurologic:  Sensation intact distally Lymphatic:  No axillary or cervical lymphadenopathy  Orthopedic Exam:  Orthopedic examination is limited to the left hip and lower extremity. The left lower extremity appears to be symmetrically aligned to the right, although it may be slightly shorter. Skin inspection of the left hip is unremarkable. No swelling, erythema, ecchymosis, abrasions, or other skin abnormalities are identified. She has some pain to palpation over the lateral aspect of the hip. She has more severe pain with any attempted active or passive motion of the hip. She is able dorsiflex and plantarflex her toes and ankle. Sensation is intact to light touch to all distributions. She has good capillary refill to her left foot.  X-rays:  X-rays of the pelvis and left hip, as well as a CT scan of the pelvis are available for review and have been reviewed by myself. The findings are as described above. These films demonstrate an impacted intertrochanteric fracture of the left hip. No significant degenerative changes of the left hip joint are identified. No lytic lesions or other acute bony abnormalities are noted.  Assessment: Impacted intertrochanteric fracture, left hip.  Plan: The treatment options, including both surgical  and nonsurgical choices, have been discussed in detail with the patient and her daughter who is at the bedside. A second daughter is also listening to this conversation by phone. The patient and her family would like to proceed with surgical intervention to include an intramedullary nailing of the intertrochanteric fracture of the left hip. The risks (including bleeding, infection, nerve and/or blood vessel injury, persistent or recurrent pain, malunion or nonunion, leg length inequality, failure of fixation, need for further surgery, blood clots, strokes, heart attacks or arrhythmias, pneumonia, etc.) and benefits of the surgical procedure were discussed. The patient and her family state their understanding and agree to proceed. She agrees to a blood transfusion if necessary. A formal written consent has been obtained.  Per the hospitalist's recommendation, the patient's surgery will be scheduled for tomorrow morning so as to permit a more complete work-up of her witnessed syncope episodes following the fall.  Thank you for asking me to participate in the care of this most pleasant woman and her family. I will be happy to follow her with you.   Pascal Lux, MD  Beeper #:  [phone]  02/07/2020 12:56 PM

## 2020-02-07 NOTE — Progress Notes (Signed)
*  PRELIMINARY RESULTS* Echocardiogram 2D Echocardiogram has been performed.  Emma Kennedy 02/07/2020, 12:30 PM

## 2020-02-07 NOTE — ED Notes (Signed)
Pharmacy messaged for missing meds 

## 2020-02-07 NOTE — ED Triage Notes (Signed)
Pt arrives from home via [facility] after fall where she hit her head. Scalp laceration to (L) posterior head with minimal bleeding. Per EMS patient is not on blood thinners and has history of dementia.

## 2020-02-07 NOTE — ED Notes (Signed)
Daughter at bedside, pt resting at this time.

## 2020-02-07 NOTE — ED Provider Notes (Signed)
Patient with comminuted intertrochanteric fracture on CT, reviewed by me.  Will require admission, have consulted the hospitalist for admission, consulted Dr. Roland Rack of orthopedics, not on blood thinners, Covid negative   Lavonia Drafts, MD 02/07/20 [phone]

## 2020-02-07 NOTE — Progress Notes (Signed)
Initial Nutrition Assessment  DOCUMENTATION CODES:   Not applicable  INTERVENTION:    Ensure Enlive po BID, each supplement provides 350 kcal and 20 grams of protein  MVI daily   NUTRITION DIAGNOSIS:   Increased nutrient needs related to hip fracture as evidenced by estimated needs.  GOAL:   Patient will meet greater than or equal to 90% of their needs  MONITOR:   PO intake, Supplement acceptance, Weight trends, Labs, I & O's  REASON FOR ASSESSMENT:   Consult Hip fracture protocol  ASSESSMENT:   Patient with PMH significant for arthritis, diverticulosis, dementia, and hypothyroidism. Presents this admission after mechanical fall resulting in L hip fracture.   Spoke with daughter via phone. Denies pt has loss in appetite PTA. Typically consumed three small meals daily that consist of B-boiled egg, oatmeal, grit, sausage L-soup or butterbeans D-grilled chicken or beans with hotdogs. Drinks 1 chocolate Boost daily. Discussed the importance of protein intake to promote healing. Pt willing to try Ensure.   Daughter reports pt UBW stays around 93 lb and denies wt loss. Records show stated weight this admission of 93lb. Will need to obtain actual weight to assess for weight loss.   Drips: NS @ 75 ml/hr  Medications: Vit D, Vit B12 Labs: CBG 101  Diet Order:   Diet Order            Diet NPO time specified Except for: Ice Chips, Sips with Meds  Diet effective midnight                 EDUCATION NEEDS:   Education needs have been addressed  Skin:  Skin Assessment: Reviewed RN Assessment  Last BM:  PTA  Height:   Ht Readings from Last 1 Encounters:  02/07/20 4\' 11"  (1.499 m)    Weight:   Wt Readings from Last 1 Encounters:  02/07/20 42.2 kg    BMI:  Body mass index is 18.78 kg/m.  Estimated Nutritional Needs:   Kcal:  1250-1450 kcal  Protein:  65-80  Fluid:  >/= 1.2 L/day   Mariana Single RD, LDN Clinical Nutrition Pager listed in [place]

## 2020-02-07 NOTE — ED Notes (Signed)
Pt cleaned up by EDT Mickel Baas and daughter.

## 2020-02-07 NOTE — ED Provider Notes (Addendum)
[facility] Emergency Department Provider Note  ____________________________________________   First MD Initiated Contact with Patient 02/07/20 [phone]     (approximate)  I have reviewed the triage vital signs and the nursing notes.   HISTORY  Chief Complaint Fall   HPI Emma Kennedy is a 86 y.o. female with a past medical history of arthritis, anemia, thyroid disease, diverticulosis, and dementia who presents via EMS from home after ground-level fall.  Patient is unable to provide any significant history secondary to altered mental status.  Shortly after patient arrival her daughter also arrived at bedside who does live with the patient was able to provide some additional history.  It seems the patient fell while using a bedside commode at home and per her daughter had LOC for approximately 10 seconds.  Patient is otherwise been in her usual state health without any other recent falls or injuries, vomiting, diarrhea, dysuria, cough, fevers, or other sick symptoms.  She is not anticoagulated.  She is typically confused and not oriented at baseline secondary to dementia.         Past Medical History:  Diagnosis Date  . Anemia 2005  . Arthritis   . Diverticulosis   . Headache 2013  . Osteoarthritis   . Osteoporosis   . Thyroid disease     There are no problems to display for this patient.   Past Surgical History:  Procedure Laterality Date  . ABDOMINAL HYSTERECTOMY    . APPENDECTOMY    . bilateral foot surgery     . BLADDER SURGERY     bladder tack x 2  . COLON SURGERY  1998   removal of part of colon   . EYE SURGERY    . SACROPLASTY N/A 08/13/2018   Procedure: SACROPLASTY;  Surgeon: Hessie Knows, MD;  Location: [facility] ORS;  Service: Orthopedics;  Laterality: N/A;    Prior to Admission medications   Medication Sig Start Date End Date Taking? Authorizing Provider  acetaminophen (TYLENOL) 325 MG tablet Take 2 tablets (650 mg total) by mouth every  6 (six) hours as needed. Patient not taking: Reported on 08/12/2018 05/01/16   Carrie Mew, MD  alendronate (FOSAMAX) 70 MG tablet Take 70 mg by mouth once a week. 08/10/17   [provider]  aspirin 81 MG tablet Take 81 mg by mouth daily.    [provider]  Cholecalciferol (VITAMIN D3) 25 MCG (1000 UT) CAPS Take 1,000 Units by mouth daily.     [provider]  Cyanocobalamin (VITAMIN B-12 PO) Take 5,000 mcg by mouth daily.    [provider]  diclofenac sodium (VOLTAREN) 1 % GEL Apply 2 g topically 4 (four) times daily. 05/01/16   Carrie Mew, MD  fentaNYL (DURAGESIC) 12 MCG/HR Place 1 patch onto the skin every 3 (three) days. 08/07/18   [provider]  gabapentin (NEURONTIN) 300 MG capsule Take 300 mg by mouth 2 (two) times daily.  07/23/13   [provider]  hydroxychloroquine (PLAQUENIL) 200 MG tablet Take 200 mg by mouth daily. 02/27/18   [provider]  ibuprofen (ADVIL,MOTRIN) 800 MG tablet Take 800 mg by mouth 3 (three) times daily after meals. 08/05/18   [provider]  levothyroxine (SYNTHROID, LEVOTHROID) 112 MCG tablet Take 112 mcg by mouth daily before breakfast.  08/11/13   [provider]  Polyethyl Glycol-Propyl Glycol (SYSTANE OP) Place 1 drop into both eyes 2 (two) times daily as needed (dry eyes).  [provider]  polyethylene glycol powder (GLYCOLAX/MIRALAX) powder Take 1 Container by mouth daily as needed for moderate constipation.  08/15/13   [provider]  sulfaSALAzine (AZULFIDINE) 500 MG tablet Take 500 mg by mouth 2 (two) times daily.  08/26/13   [provider]    Allergies Chloride, Clinoril [sulindac], Etodolac, Meloxicam, Methotrexate derivatives, Prednisone, Relafen [nabumetone], and Trospium  No family history on file.  Social History Social History   Tobacco Use  . Smoking status: Former Smoker    Packs/day: 1.00    Years: 20.00    Pack  years: 20.00    Types: Cigarettes  . Smokeless tobacco: Never Used  Vaping Use  . Vaping Use: Never used  Substance Use Topics  . Alcohol use: No  . Drug use: Never    Review of Systems  Review of Systems  Unable to perform ROS: Dementia      ____________________________________________   PHYSICAL EXAM:  VITAL SIGNS: ED Triage Vitals  Enc Vitals Group     BP      Pulse      Resp      Temp      Temp src      SpO2      Weight      Height      Head Circumference      Peak Flow      Pain Score      Pain Loc      Pain Edu?      Excl. in [place]?    Vitals:   02/07/20 0600 02/07/20 0630  BP: 127/83 118/60  Pulse: 65 60  Resp: (!) 22 17  Temp:    SpO2: (!) 85% 96%   Physical Exam Vitals and nursing note reviewed.  Constitutional:      General: She is not in acute distress.    Appearance: She is well-developed.  HENT:     Head: Normocephalic.     Right Ear: External ear normal.     Left Ear: External ear normal.     Nose: Nose normal.     Mouth/Throat:     Mouth: Mucous membranes are moist.  Eyes:     Conjunctiva/sclera: Conjunctivae normal.  Cardiovascular:     Rate and Rhythm: Normal rate and regular rhythm.     Heart sounds: No murmur heard.   Pulmonary:     Effort: Pulmonary effort is normal. No respiratory distress.     Breath sounds: Normal breath sounds.  Abdominal:     Palpations: Abdomen is soft.     Tenderness: There is no abdominal tenderness.  Musculoskeletal:     Cervical back: Neck supple.  Skin:    General: Skin is warm and dry.     Capillary Refill: Capillary refill takes less than 2 seconds.  Neurological:     Mental Status: She is alert. Mental status is at baseline. She is disoriented and confused.  Psychiatric:        Mood and Affect: Mood normal.     Patient has a linear hemostatic 0.5 cm laceration over the left parietal scalp.  PERRLA.  EOMI.  Oropharynx unremarkable.  Patient is able to move all extremities with symmetric  strength and sensation intact to light touch of all extremities.  She does not participate in further formal neuro exam testing.  No other obvious evidence of trauma to the extremities with exception of some tenderness over the proximal left hip without any overlying skin changes or  decrease in range of motion.  Patient does seem slightly weaker at the left hip compared to the right.  No step-offs or deformities over the C/T/L-spine.  Chest and abdomen are unremarkable.  2+ bilateral radial and DP pulses.  Sensation intact throughout the bilateral lower extremities. ____________________________________________   LABS (all labs ordered are listed, but only abnormal results are displayed)  Labs Reviewed  CBC WITH DIFFERENTIAL/PLATELET - Abnormal; Notable for the following components:      Result Value   RBC 3.67 (*)    Hemoglobin 11.4 (*)    HCT 35.2 (*)    All other components within normal limits  COMPREHENSIVE METABOLIC PANEL - Abnormal; Notable for the following components:   Sodium 134 (*)    Glucose, Bld 101 (*)    All other components within normal limits  URINALYSIS, COMPLETE (UACMP) WITH MICROSCOPIC - Abnormal; Notable for the following components:   Color, Urine YELLOW (*)    APPearance CLEAR (*)    All other components within normal limits  RESPIRATORY PANEL BY RT PCR (FLU A&B, COVID)  BRAIN NATRIURETIC PEPTIDE  TROPONIN I (HIGH SENSITIVITY)  TROPONIN I (HIGH SENSITIVITY)   ____________________________________________  EKG  Sinus bradycardia with a ventricular rate of 57, unremarkable intervals, apparent artifact in V4 and Q waves in V1 as well as aVL without other clear evidence of acute ischemia. ____________________________________________  RADIOLOGY  ED MD interpretation: CT head shows no evidence of calvarial skull fracture or intracranial bleeding.  CT C-spine is unremarkable.  Chest x-ray showed no evidence of rib fracture or pneumothorax but does show evidence of  possible volume overload versus sequelae of viral infection.  Given absence of fever or cough low suspicion for active infection at this time.  Official radiology report(s): DG Chest 1 View  Result Date: 02/07/2020 CLINICAL DATA:  Initial evaluation for acute trauma, fall. EXAM: CHEST  1 VIEW COMPARISON:  Prior radiograph from 05/01/2016. FINDINGS: Patient is rotated to the right. Allowing for rotation, mediastinal silhouette within normal limits. Aortic atherosclerosis. Cardiomegaly grossly stable from previous. Lungs mildly hypoinflated. Diffuse prominence of the pulmonary vasculature and interstitial markings, favored to reflect pulmonary interstitial congestion/edema. Sequelae of atypical infection could also have this appearance. No frank airspace consolidation. No pleural effusion. No pneumothorax. No acute osseous finding. Prominent degenerative changes noted about the shoulders. IMPRESSION: 1. Cardiomegaly with diffuse prominence of the pulmonary vasculature and interstitial markings, favored to reflect pulmonary interstitial congestion/edema. Sequelae of atypical infection could also have this appearance. 2.  Aortic Atherosclerosis (ICD10-I70.0). Electronically Signed   By: Jeannine Boga M.D.   On: 02/07/2020 04:01   CT Head Wo Contrast  Result Date: 02/07/2020 CLINICAL DATA:  Initial evaluation for acute trauma, fall. EXAM: CT HEAD WITHOUT CONTRAST TECHNIQUE: Contiguous axial images were obtained from the base of the skull through the vertex without intravenous contrast. COMPARISON:  Prior CT from 07/07/2019. FINDINGS: Brain: Age-related cerebral atrophy with chronic microvascular ischemic disease. No acute intracranial hemorrhage. No acute large vessel territory infarct. No mass lesion, midline shift or mass effect. No hydrocephalus or extra-axial fluid collection. Vascular: No hyperdense vessel. Calcified atherosclerosis present at skull base. Skull: Left parietal scalp  contusion/laceration with skin staple in place. No calvarial fracture. Sinuses/Orbits: Globes orbital soft tissues within normal limits. Paranasal sinuses and mastoid air cells are clear. Other: None. IMPRESSION: 1. No acute intracranial abnormality. 2. Left parietal scalp contusion/laceration with skin staple in place. No calvarial fracture. 3. Age-related cerebral atrophy with chronic microvascular ischemic disease. Electronically  Signed   By: Jeannine Boga M.D.   On: 02/07/2020 04:45   CT Cervical Spine Wo Contrast  Result Date: 02/07/2020 CLINICAL DATA:  Initial evaluation for acute trauma, fall. EXAM: CT CERVICAL SPINE WITHOUT CONTRAST TECHNIQUE: Multidetector CT imaging of the cervical spine was performed without intravenous contrast. Multiplanar CT image reconstructions were also generated. COMPARISON:  None. FINDINGS: Alignment: Vertebral bodies normally aligned with preservation of the normal cervical lordosis. No listhesis or malalignment. Skull base and vertebrae: Skull base intact. Normal C1-2 articulations are preserved in the dens is intact. Vertebral body heights maintained. No acute fracture. Soft tissues and spinal canal: Soft tissues of the neck demonstrate no acute finding. No abnormal prevertebral edema. Spinal canal within normal limits. Disc levels: Prominent degenerative changes noted about the C1-2 articulation. Mild for age multilevel cervical spondylosis without high-grade spinal stenosis. Upper chest: Visualized upper chest demonstrates no acute finding. Irregular pleuroparenchymal thickening noted about the lung apices bilaterally. Other: None. IMPRESSION: No acute traumatic injury within the cervical spine. Electronically Signed   By: Jeannine Boga M.D.   On: 02/07/2020 05:03   DG Pelvis Portable  Result Date: 02/07/2020 CLINICAL DATA:  Fall.  History of dementia. EXAM: PORTABLE PELVIS 1-2 VIEWS COMPARISON:  10/24/2011 CT FINDINGS: Osteopenia. Prior sacralplasty.  Femoral heads are located. Skin fold and possible clothing artifact project over the left greater than right femoral neck. Otherwise, no fracture identified. Sacroiliac joints are grossly symmetric. IMPRESSION: Artifact degraded evaluation of the proximal femurs, especially on the left. If there left hip symptoms, recommend dedicated radiographs. Otherwise, no acute osseous abnormality. Electronically Signed   By: Abigail Miyamoto M.D.   On: 02/07/2020 04:22   DG HIP UNILAT WITH PELVIS 2-3 VIEWS LEFT  Result Date: 02/07/2020 CLINICAL DATA:  Left hip pain after a fall. EXAM: DG HIP (WITH OR WITHOUT PELVIS) 2-3V LEFT COMPARISON:  Earlier today, pelvic films. FINDINGS: AP view the pelvis and AP/frog leg views of the left hip. Osteopenia. Prior sacral plasty. Femoral heads are located. There is again clothing and/or skin fold artifacts projecting over the left proximal femur. No convincing evidence of displaced femur fracture. IMPRESSION: Better, but still somewhat limited evaluation of the proximal left femur/left hip. Correlate with overlying clothing artifact. If ongoing clinical suspicion of nondisplaced left hip fracture, consider CT. Electronically Signed   By: Abigail Miyamoto M.D.   On: 02/07/2020 05:56    ____________________________________________   PROCEDURES  Procedure(s) performed (including Critical Care):  Marland KitchenMarland KitchenLaceration Repair  Date/Time: 02/07/2020 4:44 AM Performed by: Lucrezia Starch, MD Authorized by: Lucrezia Starch, MD   Consent:    Consent obtained:  Verbal   Consent given by:  Healthcare agent Laceration details:    Location:  Scalp   Scalp location:  L parietal   Length (cm):  0.5 Repair type:    Repair type:  Simple Exploration:    Contaminated: no   Treatment:    Irrigation solution:  Sterile saline   Irrigation method:  Syringe   Visualized foreign bodies/material removed: no   Skin repair:    Repair method:  Staples   Number of staples:  1 Approximation:     Approximation:  Close Post-procedure details:    Patient tolerance of procedure:  Tolerated well, no immediate complications  .1-3 Lead EKG Interpretation Performed by: Lucrezia Starch, MD Authorized by: Lucrezia Starch, MD     Interpretation: normal     ECG rate assessment: bradycardic     Rhythm: sinus rhythm  Ectopy: none     Conduction: normal       ____________________________________________   INITIAL IMPRESSION / ASSESSMENT AND PLAN / ED COURSE        Patient presents above to history exam for assessment after ground-level fall that occurred at home as described above.  On arrival patient is slightly hypertensive with a BP of 177/106 with otherwise stable vital signs on room air.  Because patient's daughter did not actually witness mother falling out of her commode is possible patient had a a syncopal episode although I suspect she had a mechanical fall.  There are no focal findings on exam or other findings on head CT to suggest CVA.  Patient has some nonspecific findings on EKG low suspicion for acute ACS given absence of chest pain and to nonelevated troponins obtained over 2 hours.  BNP is WNL and patient does not appear overloaded on exam and low suspicion for volume overload/CHF despite chest x-ray findings.  There are no significant electrolyte or metabolic derangements on CMP.  CBC shows mild anemia with hemoglobin of 11.4 which is elevated from 11.1-year ago of 11.3 and a low suspicion for hemorrhage or acute symptomatic anemia.  UA is unremarkable not suggestive of infection.  Patient has no evidence of intracranial hemorrhage or skull fracture under CT head no evidence of C-spine fracture or dislocation or C-spine her left hip x-ray is medicolegal and so CT hip was obtained to assess for nondisplaced fracture.  Care of patient assumed by oncoming divider at approximately 0 700.  Plan is to follow-up CTA hip and if there is a fracture consult orthopedic service  and admit.      ____________________________________________   FINAL CLINICAL IMPRESSION(S) / ED DIAGNOSES  Final diagnoses:  Laceration of scalp, initial encounter  Fall, initial encounter  Left hip pain    Medications  fentaNYL (SUBLIMAZE) injection 25 mcg (25 mcg Intravenous Given 02/07/20 0352)  acetaminophen (TYLENOL) tablet 500 mg (500 mg Oral Given 02/07/20 0608)     ED Discharge Orders    None       Note:  This document was prepared using Dragon voice recognition software and may include unintentional dictation errors.   Lucrezia Starch, MD 02/07/20 9628    Lucrezia Starch, MD 02/07/20 [phone]

## 2020-02-08 ENCOUNTER — Inpatient Hospital Stay: Payer: PPO | Admitting: Anesthesiology

## 2020-02-08 ENCOUNTER — Inpatient Hospital Stay: Payer: PPO

## 2020-02-08 ENCOUNTER — Encounter
Admission: EM | Disposition: A | Discharge status: Skilled Nursing Facility | Payer: Self-pay | Source: Home / Self Care | Attending: Internal Medicine

## 2020-02-08 DIAGNOSIS — S0101XA Laceration without foreign body of scalp, initial encounter: Secondary | ICD-10-CM

## 2020-02-08 DIAGNOSIS — F0280 Dementia in other diseases classified elsewhere without behavioral disturbance: Secondary | ICD-10-CM

## 2020-02-08 DIAGNOSIS — G309 Alzheimer's disease, unspecified: Secondary | ICD-10-CM

## 2020-02-08 HISTORY — PX: INTRAMEDULLARY (IM) NAIL INTERTROCHANTERIC: SHX5875

## 2020-02-08 LAB — CBC
HCT: 35 % — ABNORMAL LOW (ref 36.0–46.0)
Hemoglobin: 11.6 g/dL — ABNORMAL LOW (ref 12.0–15.0)
MCH: 31.7 pg (ref 26.0–34.0)
MCHC: 33.1 g/dL (ref 30.0–36.0)
MCV: 95.6 fL (ref 80.0–100.0)
Platelets: 209 10*3/uL (ref 150–400)
RBC: 3.66 MIL/uL — ABNORMAL LOW (ref 3.87–5.11)
RDW: 12.7 % (ref 11.5–15.5)
WBC: 9.9 10*3/uL (ref 4.0–10.5)
nRBC: 0 % (ref 0.0–0.2)

## 2020-02-08 LAB — SURGICAL PCR SCREEN
MRSA, PCR: NEGATIVE
Staphylococcus aureus: NEGATIVE

## 2020-02-08 SURGERY — FIXATION, FRACTURE, INTERTROCHANTERIC, WITH INTRAMEDULLARY ROD
Anesthesia: General | Laterality: Left

## 2020-02-08 MED ORDER — ACETAMINOPHEN 10 MG/ML IV SOLN
INTRAVENOUS | Status: AC
  Filled 2020-02-08: qty 100

## 2020-02-08 MED ORDER — ROCURONIUM BROMIDE 100 MG/10ML IV SOLN
INTRAVENOUS | Status: DC | PRN
  Administered 2020-02-08: 40 mg via INTRAVENOUS

## 2020-02-08 MED ORDER — DOCUSATE SODIUM 100 MG PO CAPS
100.0000 mg | ORAL_CAPSULE | Freq: Two times a day (BID) | ORAL | Status: DC
  Administered 2020-02-08 – 2020-02-11 (×8): 100 mg via ORAL
  Filled 2020-02-08 (×9): qty 1

## 2020-02-08 MED ORDER — ACETAMINOPHEN 10 MG/ML IV SOLN
INTRAVENOUS | Status: DC | PRN
  Administered 2020-02-08: 500 mg via INTRAVENOUS

## 2020-02-08 MED ORDER — DEXAMETHASONE SODIUM PHOSPHATE 10 MG/ML IJ SOLN
INTRAMUSCULAR | Status: AC
  Filled 2020-02-08: qty 1

## 2020-02-08 MED ORDER — ENOXAPARIN SODIUM 30 MG/0.3ML ~~LOC~~ SOLN
30.0000 mg | SUBCUTANEOUS | Status: DC
  Administered 2020-02-09 – 2020-02-12 (×4): 30 mg via SUBCUTANEOUS
  Filled 2020-02-08 (×4): qty 0.3

## 2020-02-08 MED ORDER — PHENYLEPHRINE HCL (PRESSORS) 10 MG/ML IV SOLN
INTRAVENOUS | Status: DC | PRN
  Administered 2020-02-08 (×3): 100 ug via INTRAVENOUS

## 2020-02-08 MED ORDER — PROPOFOL 500 MG/50ML IV EMUL
INTRAVENOUS | Status: AC
  Filled 2020-02-08: qty 50

## 2020-02-08 MED ORDER — DIPHENHYDRAMINE HCL 12.5 MG/5ML PO ELIX: 12.5000 mg | ORAL_SOLUTION | ORAL | Status: DC | PRN

## 2020-02-08 MED ORDER — FENTANYL CITRATE (PF) 100 MCG/2ML IJ SOLN
INTRAMUSCULAR | Status: AC
  Filled 2020-02-08: qty 2

## 2020-02-08 MED ORDER — ONDANSETRON HCL 4 MG PO TABS: 4.0000 mg | ORAL_TABLET | Freq: Four times a day (QID) | ORAL | Status: DC | PRN

## 2020-02-08 MED ORDER — EPHEDRINE SULFATE 50 MG/ML IJ SOLN
INTRAMUSCULAR | Status: DC | PRN
  Administered 2020-02-08 (×4): 10 mg via INTRAVENOUS

## 2020-02-08 MED ORDER — TRAMADOL HCL 50 MG PO TABS
50.0000 mg | ORAL_TABLET | Freq: Four times a day (QID) | ORAL | Status: DC | PRN
  Administered 2020-02-10 – 2020-02-12 (×2): 50 mg via ORAL
  Filled 2020-02-08 (×2): qty 1

## 2020-02-08 MED ORDER — ONDANSETRON HCL 4 MG/2ML IJ SOLN
INTRAMUSCULAR | Status: DC | PRN
  Administered 2020-02-08: 4 mg via INTRAVENOUS

## 2020-02-08 MED ORDER — PROPOFOL 10 MG/ML IV BOLUS
INTRAVENOUS | Status: DC | PRN
  Administered 2020-02-08: 15 mg via INTRAVENOUS
  Administered 2020-02-08: 60 mg via INTRAVENOUS
  Administered 2020-02-08: 20 mg via INTRAVENOUS

## 2020-02-08 MED ORDER — BUPIVACAINE-EPINEPHRINE (PF) 0.5% -1:200000 IJ SOLN
INTRAMUSCULAR | Status: DC | PRN
  Administered 2020-02-08: 30 mL

## 2020-02-08 MED ORDER — FENTANYL CITRATE (PF) 100 MCG/2ML IJ SOLN
INTRAMUSCULAR | Status: DC | PRN
  Administered 2020-02-08: 50 ug via INTRAVENOUS

## 2020-02-08 MED ORDER — FLEET ENEMA 7-19 GM/118ML RE ENEM: 1.0000 | ENEMA | Freq: Once | RECTAL | Status: DC | PRN

## 2020-02-08 MED ORDER — ONDANSETRON HCL 4 MG/2ML IJ SOLN
4.0000 mg | Freq: Four times a day (QID) | INTRAMUSCULAR | Status: DC | PRN
  Administered 2020-02-10: 4 mg via INTRAVENOUS
  Filled 2020-02-08: qty 2

## 2020-02-08 MED ORDER — ACETAMINOPHEN 500 MG PO TABS
500.0000 mg | ORAL_TABLET | Freq: Four times a day (QID) | ORAL | Status: DC
  Administered 2020-02-08 – 2020-02-09 (×3): 500 mg via ORAL
  Filled 2020-02-08 (×3): qty 1

## 2020-02-08 MED ORDER — BACITRACIN ZINC 500 UNIT/GM EX OINT
TOPICAL_OINTMENT | Freq: Two times a day (BID) | CUTANEOUS | Status: AC
  Filled 2020-02-08 (×6): qty 0.9

## 2020-02-08 MED ORDER — ALPRAZOLAM 0.25 MG PO TABS
0.2500 mg | ORAL_TABLET | Freq: Two times a day (BID) | ORAL | Status: DC | PRN
  Administered 2020-02-08: 0.25 mg via ORAL
  Filled 2020-02-08: qty 1

## 2020-02-08 MED ORDER — LACTATED RINGERS IV SOLN: INTRAVENOUS | Status: DC | PRN

## 2020-02-08 MED ORDER — BISACODYL 10 MG RE SUPP: 10.0000 mg | Freq: Every day | RECTAL | Status: DC | PRN

## 2020-02-08 MED ORDER — DEXAMETHASONE SODIUM PHOSPHATE 10 MG/ML IJ SOLN
INTRAMUSCULAR | Status: DC | PRN
  Administered 2020-02-08: 5 mg via INTRAVENOUS

## 2020-02-08 MED ORDER — ONDANSETRON HCL 4 MG/2ML IJ SOLN
INTRAMUSCULAR | Status: AC
  Filled 2020-02-08: qty 2

## 2020-02-08 MED ORDER — OXYCODONE HCL 5 MG PO TABS: 5.0000 mg | ORAL_TABLET | ORAL | Status: DC | PRN

## 2020-02-08 MED ORDER — METOCLOPRAMIDE HCL 5 MG/ML IJ SOLN: 5.0000 mg | Freq: Three times a day (TID) | INTRAMUSCULAR | Status: DC | PRN

## 2020-02-08 MED ORDER — EPHEDRINE 5 MG/ML INJ
INTRAVENOUS | Status: AC
  Filled 2020-02-08: qty 10

## 2020-02-08 MED ORDER — METOCLOPRAMIDE HCL 10 MG PO TABS: 5.0000 mg | ORAL_TABLET | Freq: Three times a day (TID) | ORAL | Status: DC | PRN

## 2020-02-08 MED ORDER — ONDANSETRON HCL 4 MG/2ML IJ SOLN: 4.0000 mg | Freq: Once | INTRAMUSCULAR | Status: DC | PRN

## 2020-02-08 MED ORDER — MAGNESIUM HYDROXIDE 400 MG/5ML PO SUSP: 30.0000 mL | Freq: Every day | ORAL | Status: DC | PRN

## 2020-02-08 MED ORDER — CEFAZOLIN SODIUM-DEXTROSE 1-4 GM/50ML-% IV SOLN
1.0000 g | Freq: Four times a day (QID) | INTRAVENOUS | Status: AC
  Administered 2020-02-08 – 2020-02-09 (×3): 1 g via INTRAVENOUS
  Filled 2020-02-08 (×3): qty 50

## 2020-02-08 MED ORDER — SUGAMMADEX SODIUM 200 MG/2ML IV SOLN
INTRAVENOUS | Status: DC | PRN
  Administered 2020-02-08: 100 mg via INTRAVENOUS

## 2020-02-08 MED ORDER — FENTANYL CITRATE (PF) 100 MCG/2ML IJ SOLN: 25.0000 ug | INTRAMUSCULAR | Status: DC | PRN

## 2020-02-08 SURGICAL SUPPLY — 44 items
BIT DRILL 4.3MMS DISTAL GRDTED (BIT) IMPLANT
BNDG COHESIVE 4X5 TAN STRL (GAUZE/BANDAGES/DRESSINGS) ×2 IMPLANT
BNDG COHESIVE 6X5 TAN STRL LF (GAUZE/BANDAGES/DRESSINGS) ×2 IMPLANT
CANISTER SUCT 1200ML W/VALVE (MISCELLANEOUS) ×2 IMPLANT
CHLORAPREP W/TINT 26 (MISCELLANEOUS) ×3 IMPLANT
COVER WAND RF STERILE (DRAPES) ×2 IMPLANT
DRAPE 3/4 80X56 (DRAPES) ×2 IMPLANT
DRAPE C-ARMOR (DRAPES) ×2 IMPLANT
DRILL 4.3MMS DISTAL GRADUATED (BIT) ×2
DRSG OPSITE POSTOP 3X4 (GAUZE/BANDAGES/DRESSINGS) ×3 IMPLANT
DRSG OPSITE POSTOP 4X6 (GAUZE/BANDAGES/DRESSINGS) IMPLANT
ELECT CAUTERY BLADE 6.4 (BLADE) ×2 IMPLANT
ELECT REM PT RETURN 9FT ADLT (ELECTROSURGICAL) ×2
ELECTRODE REM PT RTRN 9FT ADLT (ELECTROSURGICAL) ×1 IMPLANT
GAUZE SPONGE 4X4 12PLY STRL (GAUZE/BANDAGES/DRESSINGS) ×2 IMPLANT
GLOVE BIO SURGEON STRL SZ8 (GLOVE) ×4 IMPLANT
GLOVE INDICATOR 8.0 STRL GRN (GLOVE) ×2 IMPLANT
GOWN STRL REUS W/ TWL LRG LVL3 (GOWN DISPOSABLE) ×1 IMPLANT
GOWN STRL REUS W/ TWL XL LVL3 (GOWN DISPOSABLE) ×1 IMPLANT
GOWN STRL REUS W/TWL LRG LVL3 (GOWN DISPOSABLE) ×1
GOWN STRL REUS W/TWL XL LVL3 (GOWN DISPOSABLE) ×1
GUIDEPIN 3.2X17.5 THRD DISP (PIN) ×1 IMPLANT
GUIDEWIRE BALL NOSE 80CM (WIRE) ×1 IMPLANT
MAT ABSORB  FLUID 56X50 GRAY (MISCELLANEOUS) ×1
MAT ABSORB FLUID 56X50 GRAY (MISCELLANEOUS) ×1 IMPLANT
NAIL HIP FRA AFFIX 130X9X340 L (Nail) ×1 IMPLANT
NDL FILTER BLUNT 18X1 1/2 (NEEDLE) ×1 IMPLANT
NEEDLE FILTER BLUNT 18X 1/2SAF (NEEDLE) ×1
NEEDLE FILTER BLUNT 18X1 1/2 (NEEDLE) ×1 IMPLANT
NEEDLE HYPO 22GX1.5 SAFETY (NEEDLE) ×2 IMPLANT
NS IRRIG 500ML POUR BTL (IV SOLUTION) ×2 IMPLANT
PACK HIP COMPR (MISCELLANEOUS) ×2 IMPLANT
SCREW BONE CORTICAL 5.0X42 (Screw) ×1 IMPLANT
SCREW LAG HIP NAIL 10.5X95 (Screw) ×1 IMPLANT
STAPLER SKIN PROX 35W (STAPLE) ×2 IMPLANT
STRAP SAFETY 5IN WIDE (MISCELLANEOUS) ×2 IMPLANT
SUT VIC AB 0 CT1 36 (SUTURE) ×2 IMPLANT
SUT VIC AB 1 CT1 36 (SUTURE) ×2 IMPLANT
SUT VIC AB 2-0 CT1 (SUTURE) ×4 IMPLANT
SUT VIC AB 2-0 CT1 27 (SUTURE) ×1
SUT VIC AB 2-0 CT1 TAPERPNT 27 (SUTURE) IMPLANT
SYR 10ML LL (SYRINGE) ×2 IMPLANT
SYR 30ML LL (SYRINGE) ×2 IMPLANT
TAPE MICROFOAM 4IN (TAPE) ×2 IMPLANT

## 2020-02-08 NOTE — Op Note (Signed)
02/08/2020  10:04 AM  Patient:   Emma Kennedy  Pre-Op Diagnosis:   Closed displaced intertrochanteric fracture, left hip.  Post-Op Diagnosis:   Same  Procedure:   Reduction and internal fixation of displaced intertrochanteric left hip fracture with Biomet Affixis TFN nail.  Surgeon:   Pascal Lux, MD  Assistant:   None  Anesthesia:   Attempted spinal -> GET  Findings:   As above  Complications:   None  EBL:   75 cc  Fluids:   800 cc crystalloid  UOP:   None  TT:   None  Drains:   None  Closure:   Staples  Implants:   Biomet Affixis 9 x 340 mm TFN with a 95 mm lag screw and a 42 mm distal interlocking screw  Brief Clinical Note:   The patient is an 86 year old female who sustained the above-noted injury 2 nights ago when she apparently fell while using her bedside commode. The patient was brought to the emergency room where x-rays and subsequent CT scanning demonstrated the above-noted injury. The patient has been cleared medically and presents at this time for reduction and internal fixation of the displaced intertrochanteric left hip fracture.  Procedure:   The patient was brought into the operating room. After an attempt at spinal anesthesia was performed, the patient underwent general endotracheal intubation and anesthesia before she was lain in the supine position on the fracture table. The uninjured leg was placed in a flexed and abducted position while the injured lower extremity was placed in longitudinal traction. The fracture was reduced using longitudinal traction and internal rotation. The adequacy of reduction was verified fluoroscopically in AP and lateral projections and found to be near anatomic. The lateral aspects of the left hip and thigh were prepped with ChloraPrep solution before being draped sterilely. Preoperative antibiotics were administered. A timeout was performed to verify the appropriate surgical site.   The greater trochanter was identified  fluoroscopically and an approximately 3 cm incision made about 2-3 fingerbreadths above the tip of the greater trochanter. The incision was carried down through the subcutaneous tissues to expose the gluteal fascia. This was split the length of the incision, providing access to the tip of the trochanter. Under fluoroscopic guidance, a guidewire was drilled through the tip of the trochanter into the proximal metaphysis to the level of the lesser trochanter. After verifying its position fluoroscopically in AP and lateral projections, it was overreamed with the initial reamer to the depth of the lesser trochanter. A guidewire was passed down through the femoral canal to the supracondylar region. The adequacy of guidewire position was verified fluoroscopically in AP and lateral projections before the length of the guidewire within the canal was measured and found to be 360 mm. Therefore, a 340 mm length nail was selected. The guidewire was overreamed sequentially using the flexible reamers, beginning with a 9.5 mm reamer and progressing to a 10.5 mm reamer. This provided good cortical chatter. The 9 x 340 mm Biomet Affixis TFN rod was selected and advanced to the appropriate depth, as verified fluoroscopically.   The guide system for the lag screw was positioned and advanced through an approximately 2 cm stab incision over the lateral aspect of the proximal femur. The guidewire was drilled up through the trochanteric femoral nail and into the femoral neck to rest within 5 mm of subchondral bone. After verifying its position in the femoral neck and head in both AP and lateral projections, the guidewire was measured  and found to be optimally replicated by a 95 mm lag screw. The guidewire was overreamed to the appropriate depth before the lag screw was inserted and advanced to the appropriate depth as verified fluoroscopically in AP and lateral projections. The locking screw was advanced, then backed off a quarter turn  to set the lag screw. Again the adequacy of hardware position and fracture reduction was verified fluoroscopically in AP and lateral projections and found to be excellent.  Attention was directed distally. Using the "perfect circle" technique, the leg and fluoroscopy machine were positioned appropriately. An approximately 1.5 cm stab incision was made over the skin at the appropriate point before the drill bit was advanced through the cortex and across the static hole of the nail. The appropriate length of the screw was determined before the 42 mm distal interlocking screw was positioned, then advanced and tightened securely. Again the adequacy of screw position was verified fluoroscopically in AP and lateral projections and found to be excellent.  The wounds were irrigated thoroughly with sterile saline solution before the abductor fascia was reapproximated using #1 Vicryl interrupted sutures. The subcutaneous tissues were closed using 2-0 Vicryl interrupted sutures. The skin was closed using staples. A total of 30 cc of 0.5% Sensorcaine with epinephrine was injected in and around all incisions. Sterile occlusive dressings were applied to all wounds before the patient was awakened, extubated, and transferred to the recovery room in satisfactory condition after tolerating the procedure well.

## 2020-02-08 NOTE — Transfer of Care (Signed)
Immediate Anesthesia Transfer of Care Note  Patient: Emma Kennedy  Procedure(s) Performed: INTRAMEDULLARY (IM) NAIL INTERTROCHANTRIC (Left )  Patient Location: PACU  Anesthesia Type:General  Level of Consciousness: drowsy  Airway & Oxygen Therapy: Patient Spontanous Breathing and Patient connected to face mask oxygen  Post-op Assessment: Report given to RN and Post -op Vital signs reviewed and stable  Post vital signs: Reviewed and stable  Last Vitals:  Vitals Value Taken Time  BP 130/66 02/08/20 1001  Temp 36.4 C 02/08/20 1001  Pulse 62 02/08/20 1006  Resp 26 02/08/20 1006  SpO2 83 % 02/08/20 1006  Vitals shown include unvalidated device data.  Last Pain:  Vitals:   02/08/20 1001  TempSrc:   PainSc: Asleep         Complications: No complications documented.

## 2020-02-08 NOTE — Plan of Care (Signed)

## 2020-02-08 NOTE — Consult Note (Signed)
[facility] Nurse Consult Note: Reason for Consult: PAtient presented to the ED with a 0.5cm laceration to the scalp and was seen and treated by Dr. Creig Hines, who approximated the laceration with 1 staple.[facility] nurse is consulted to provide Wound Care orders. Wound type:trauma Pressure Injury POA: N/A Measurement: 0.5cm approximated laceration. Wound bed:N/A Drainage (amount, consistency, odor) scant serosanguinous Periwound: edematous, ecchymotic Dressing procedure/placement/frequency: I will provide Nursing with conservative care orders consisting of twice daily application of bacitracin ointment to the wound after a NS cleanse and pat dry. No dressing.  Provider in next care setting will need to provide orders for staple removal at approximately 14 days.  Pressure injury prevention orders are provided for turning, heel pressure redistribution device (boots) and a sacral prophylactic foam dressing.  [facility] nursing team will not follow, but will remain available to this patient, the nursing and medical teams.  Please re-consult if needed. Thanks, Maudie Flakes, MSN, RN, [place], Arther Abbott  Pager# [phone]

## 2020-02-08 NOTE — Progress Notes (Addendum)
PT Cancellation Note  Patient Details Name: DELILA KUKLINSKI MRN: [id number] DOB: 1930/02/24   Cancelled Treatment:    Reason Eval/Treat Not Completed: Other (comment) (surgery today ). PT orders received. Patient had surgery today for repair of closed displaced intertrochanteric fracture of left hip following a fall. PT will follow up tomorrow as appropriate.   Minna Merritts, PT, MPT  Percell Locus 02/08/2020, 1:38 PM

## 2020-02-08 NOTE — H&P (Signed)
H&P and consult reviewed and patient re-examined. No changes.

## 2020-02-08 NOTE — Anesthesia Procedure Notes (Signed)
Procedure Name: Intubation Date/Time: 02/08/2020 8:37 AM Performed by: Esaw Grandchild, CRNA Pre-anesthesia Checklist: Patient identified, Emergency Drugs available, Suction available and Patient being monitored Patient Re-evaluated:Patient Re-evaluated prior to induction Oxygen Delivery Method: Circle system utilized Preoxygenation: Pre-oxygenation with 100% oxygen Induction Type: IV induction Ventilation: Mask ventilation without difficulty Laryngoscope Size: Miller and 2 Grade View: Grade I Tube type: Oral Tube size: 6.5 mm Number of attempts: 1 Airway Equipment and Method: Stylet and Oral airway Placement Confirmation: ETT inserted through vocal cords under direct vision,  positive ETCO2 and breath sounds checked- equal and bilateral Secured at: 19 cm Tube secured with: Tape Dental Injury: Teeth and Oropharynx as per pre-operative assessment

## 2020-02-08 NOTE — Anesthesia Postprocedure Evaluation (Signed)
Anesthesia Post Note  Patient: Emma Kennedy  Procedure(s) Performed: INTRAMEDULLARY (IM) NAIL INTERTROCHANTRIC (Left )  Patient location during evaluation: PACU Anesthesia Type: General Level of consciousness: awake and alert Pain management: pain level controlled Vital Signs Assessment: post-procedure vital signs reviewed and stable Respiratory status: spontaneous breathing, nonlabored ventilation, respiratory function stable and patient connected to nasal cannula oxygen Cardiovascular status: blood pressure returned to baseline and stable Postop Assessment: no apparent nausea or vomiting Anesthetic complications: no   No complications documented.   Last Vitals:  Vitals:   02/08/20 1031 02/08/20 1122  BP: 132/69 132/60  Pulse:  73  Resp: 12 16  Temp:    SpO2: 97% 95%    Last Pain:  Vitals:   02/08/20 1031  TempSrc:   PainSc: 0-No pain                 Arita Miss

## 2020-02-08 NOTE — Progress Notes (Signed)
[facility] [place] NAME: Emma Kennedy    MR#:  [id number]  DATE OF BIRTH:  1929/09/17  SUBJECTIVE:   Came in after having a mechanical fall at home. Daughter at bedside. Patient tolerated clear liquid. Denies any pain. Overall tolerated and did well with surgery. REVIEW OF SYSTEMS:   Review of Systems  Constitutional: Negative for chills, fever and weight loss.  HENT: Negative for ear discharge, ear pain and nosebleeds.   Eyes: Negative for blurred vision, pain and discharge.  Respiratory: Negative for sputum production, shortness of breath, wheezing and stridor.   Cardiovascular: Negative for chest pain, palpitations, orthopnea and PND.  Gastrointestinal: Negative for abdominal pain, diarrhea, nausea and vomiting.  Genitourinary: Negative for frequency and urgency.  Musculoskeletal: Negative for back pain and joint pain.  Neurological: Negative for sensory change, speech change, focal weakness and weakness.  Psychiatric/Behavioral: Negative for depression and hallucinations. The patient is not nervous/anxious.    Tolerating Diet:yes Tolerating PT: pending  DRUG ALLERGIES:   Allergies  Allergen Reactions  . Hydrocodone-Acetaminophen Shortness Of Breath, Nausea Only and Other (See Comments)    Causes severe dizziness  . Chloride Other (See Comments)    unknown  . Clinoril [Sulindac] Other (See Comments)    Stomach pain  . Etodolac Other (See Comments)    unknown  . Meloxicam Other (See Comments)    unknown  . Methotrexate Derivatives Other (See Comments)    unknown  . Prednisone Swelling    Ok to take low dose for short amount of time.  . Relafen [Nabumetone] Other (See Comments)    unknown  . Trospium Other (See Comments)    unknown    VITALS:  Blood pressure 131/61, pulse 75, temperature 98.7 F (37.1 C), temperature source Oral, resp. rate 16, height 4\' 11"  (1.499 m), weight 42.2 kg, SpO2 99 %.  PHYSICAL EXAMINATION:    Physical Exam  GENERAL:  86 y.o.-year-old patient lying in the bed with no acute distress.  HEENT: Head atraumatic, normocephalic. Oropharynx and nasopharynx clear.  LUNGS: Normal breath sounds bilaterally, no wheezing, rales, rhonchi. No use of accessory muscles of respiration.  CARDIOVASCULAR: S1, S2 normal. No murmurs, rubs, or gallops.  ABDOMEN: Soft, nontender, nondistended. Bowel sounds present. No organomegaly or mass.  EXTREMITIES: No cyanosis, clubbing or edema b/l.    NEUROLOGIC: Cranial nerves II through XII are intact. No focal Motor or sensory deficits b/l.   PSYCHIATRIC:  patient is alert and awake.  SKIN: No obvious rash, lesion, or ulcer.   LABORATORY PANEL:  CBC Recent Labs  Lab 02/08/20 0517  WBC 9.9  HGB 11.6*  HCT 35.0*  PLT 209    Chemistries  Recent Labs  Lab 02/07/20 0332  NA 134*  K 3.8  CL 98  CO2 28  GLUCOSE 101*  BUN 19  CREATININE 0.82  CALCIUM 9.4  AST 26  ALT 18  ALKPHOS 44  BILITOT 0.5   Cardiac Enzymes No results for input(s): TROPONINI in the last 168 hours. RADIOLOGY:  DG Chest 1 View  Result Date: 02/07/2020 CLINICAL DATA:  Initial evaluation for acute trauma, fall. EXAM: CHEST  1 VIEW COMPARISON:  Prior radiograph from 05/01/2016. FINDINGS: Patient is rotated to the right. Allowing for rotation, mediastinal silhouette within normal limits. Aortic atherosclerosis. Cardiomegaly grossly stable from previous. Lungs mildly hypoinflated. Diffuse prominence of the pulmonary vasculature and interstitial markings, favored to reflect pulmonary interstitial congestion/edema. Sequelae of atypical infection could also have  this appearance. No frank airspace consolidation. No pleural effusion. No pneumothorax. No acute osseous finding. Prominent degenerative changes noted about the shoulders. IMPRESSION: 1. Cardiomegaly with diffuse prominence of the pulmonary vasculature and interstitial markings, favored to reflect pulmonary interstitial  congestion/edema. Sequelae of atypical infection could also have this appearance. 2.  Aortic Atherosclerosis (ICD10-I70.0). Electronically Signed   By: Jeannine Boga M.D.   On: 02/07/2020 04:01   CT Head Wo Contrast  Result Date: 02/07/2020 CLINICAL DATA:  Initial evaluation for acute trauma, fall. EXAM: CT HEAD WITHOUT CONTRAST TECHNIQUE: Contiguous axial images were obtained from the base of the skull through the vertex without intravenous contrast. COMPARISON:  Prior CT from 07/07/2019. FINDINGS: Brain: Age-related cerebral atrophy with chronic microvascular ischemic disease. No acute intracranial hemorrhage. No acute large vessel territory infarct. No mass lesion, midline shift or mass effect. No hydrocephalus or extra-axial fluid collection. Vascular: No hyperdense vessel. Calcified atherosclerosis present at skull base. Skull: Left parietal scalp contusion/laceration with skin staple in place. No calvarial fracture. Sinuses/Orbits: Globes orbital soft tissues within normal limits. Paranasal sinuses and mastoid air cells are clear. Other: None. IMPRESSION: 1. No acute intracranial abnormality. 2. Left parietal scalp contusion/laceration with skin staple in place. No calvarial fracture. 3. Age-related cerebral atrophy with chronic microvascular ischemic disease. Electronically Signed   By: Jeannine Boga M.D.   On: 02/07/2020 04:45   CT Cervical Spine Wo Contrast  Result Date: 02/07/2020 CLINICAL DATA:  Initial evaluation for acute trauma, fall. EXAM: CT CERVICAL SPINE WITHOUT CONTRAST TECHNIQUE: Multidetector CT imaging of the cervical spine was performed without intravenous contrast. Multiplanar CT image reconstructions were also generated. COMPARISON:  None. FINDINGS: Alignment: Vertebral bodies normally aligned with preservation of the normal cervical lordosis. No listhesis or malalignment. Skull base and vertebrae: Skull base intact. Normal C1-2 articulations are preserved in the dens  is intact. Vertebral body heights maintained. No acute fracture. Soft tissues and spinal canal: Soft tissues of the neck demonstrate no acute finding. No abnormal prevertebral edema. Spinal canal within normal limits. Disc levels: Prominent degenerative changes noted about the C1-2 articulation. Mild for age multilevel cervical spondylosis without high-grade spinal stenosis. Upper chest: Visualized upper chest demonstrates no acute finding. Irregular pleuroparenchymal thickening noted about the lung apices bilaterally. Other: None. IMPRESSION: No acute traumatic injury within the cervical spine. Electronically Signed   By: Jeannine Boga M.D.   On: 02/07/2020 05:03   MR BRAIN WO CONTRAST  Result Date: 02/07/2020 CLINICAL DATA:  Syncopal episode with fall trauma to the head. EXAM: MRI HEAD WITHOUT CONTRAST TECHNIQUE: Multiplanar, multiecho pulse sequences of the brain and surrounding structures were obtained without intravenous contrast. COMPARISON:  Head CT same day.  MRI 07/09/2019. FINDINGS: Brain: Diffusion imaging does not show any acute or subacute infarction. Brainstem and cerebellum appear normal. Cerebral hemispheres show moderate chronic small-vessel ischemic changes of the deep and subcortical white matter. Cortical or large vessel territory infarction. Mass lesion, hemorrhage hydrocephalus or extra-axial collection. Vascular: Major vessels at the base of the brain show flow. Skull and upper cervical spine: Negative Sinuses/Orbits: Clear/normal Other: Left parietal scalp injury with staple. IMPRESSION: No acute intracranial finding. Moderate chronic small-vessel ischemic changes of the cerebral hemispheric white matter. Left parietal scalp injury. Electronically Signed   By: Nelson Chimes M.D.   On: 02/07/2020 14:10   DG Pelvis Portable  Result Date: 02/07/2020 CLINICAL DATA:  Fall.  History of dementia. EXAM: PORTABLE PELVIS 1-2 VIEWS COMPARISON:  10/24/2011 CT FINDINGS: Osteopenia. Prior  sacralplasty. Femoral  heads are located. Skin fold and possible clothing artifact project over the left greater than right femoral neck. Otherwise, no fracture identified. Sacroiliac joints are grossly symmetric. IMPRESSION: Artifact degraded evaluation of the proximal femurs, especially on the left. If there left hip symptoms, recommend dedicated radiographs. Otherwise, no acute osseous abnormality. Electronically Signed   By: Abigail Miyamoto M.D.   On: 02/07/2020 04:22   CT Hip Left Wo Contrast  Result Date: 02/07/2020 CLINICAL DATA:  Fall with equivocal left hip radiographs. EXAM: CT OF THE LEFT HIP WITHOUT CONTRAST TECHNIQUE: Multidetector CT imaging of the left hip was performed according to the standard protocol. Multiplanar CT image reconstructions were also generated. COMPARISON:  Left hip and pelvic radiographs of earlier today. FINDINGS: Bones/Joint/Cartilage Prior bilateral sacroplasty. Femoral head is located. Moderately comminuted intertrochanteric proximal left femur fracture with mild impaction and override. Significant, grade 2 L5-S1 anterolisthesis with bilateral pars defects. Ligaments Suboptimally assessed by CT. Muscles and Tendons No gross muscular hematoma. Soft tissues Large rectal stool ball.  No well-defined soft tissue hematoma. IMPRESSION: Intertrochanteric left femur fracture with comminution. Bilateral L5 pars defects with grade 2 L5-S1 anterolisthesis. Rectal stool ball suggesting constipation or fecal impaction. Electronically Signed   By: Abigail Miyamoto M.D.   On: 02/07/2020 07:59   ECHOCARDIOGRAM COMPLETE  Result Date: 02/07/2020    ECHOCARDIOGRAM REPORT   Patient Name:   Emma Kennedy Date of Exam: 02/07/2020 Medical Rec #:  [id number]      Height:       59.0 in Accession #:    [id number]     Weight:       93.0 lb Date of Birth:  24-Jan-1930       BSA:          1.332 m Patient Age:    75 years       BP:           117/70 mmHg Patient Gender: F              HR:           62 bpm. Exam  Location:  [facility] Procedure: 2D Echo, Cardiac Doppler and Color Doppler Indications:     Syncope 780.2  History:         Patient has no prior history of Echocardiogram examinations.                  Headache.  Sonographer:     Sherrie Sport RDCS (AE) Referring Phys:  [id number] Soledad Gerlach NIU Diagnosing Phys: Ida Rogue MD IMPRESSIONS  1. Left ventricular ejection fraction, by estimation, is 60 to 65%. The left ventricle has normal function. The left ventricle has no regional wall motion abnormalities. There is mild left ventricular hypertrophy. Left ventricular diastolic parameters are consistent with Grade I diastolic dysfunction (impaired relaxation).  2. Right ventricular systolic function is normal. The right ventricular size is normal. There is moderately elevated pulmonary artery systolic pressure. The estimated right ventricular systolic pressure is 62.1 mmHg.  3. Mild mitral valve regurgitation.  4. Tricuspid valve regurgitation is mild to moderate. FINDINGS  Left Ventricle: Left ventricular ejection fraction, by estimation, is 60 to 65%. The left ventricle has normal function. The left ventricle has no regional wall motion abnormalities. The left ventricular internal cavity size was normal in size. There is  mild left ventricular hypertrophy. Left ventricular diastolic parameters are consistent with Grade I diastolic dysfunction (impaired relaxation). Right Ventricle: The right ventricular size is normal. No increase in  right ventricular wall thickness. Right ventricular systolic function is normal. There is moderately elevated pulmonary artery systolic pressure. The tricuspid regurgitant velocity is 3.39 m/s, and with an assumed right atrial pressure of 5 mmHg, the estimated right ventricular systolic pressure is 33.8 mmHg. Left Atrium: Left atrial size was normal in size. Right Atrium: Right atrial size was normal in size. Pericardium: There is no evidence of pericardial effusion. Mitral Valve: The mitral valve is  normal in structure. Mild mitral annular calcification. Mild mitral valve regurgitation. No evidence of mitral valve stenosis. Tricuspid Valve: The tricuspid valve is normal in structure. Tricuspid valve regurgitation is mild to moderate. No evidence of tricuspid stenosis. Aortic Valve: The aortic valve is normal in structure. Aortic valve regurgitation is trivial. No aortic stenosis is present. Aortic valve mean gradient measures 3.5 mmHg. Aortic valve peak gradient measures 6.5 mmHg. Aortic valve area, by VTI measures 3.33 cm. Pulmonic Valve: The pulmonic valve was normal in structure. Pulmonic valve regurgitation is not visualized. No evidence of pulmonic stenosis. Aorta: The aortic root is normal in size and structure. Venous: The inferior vena cava is normal in size with greater than 50% respiratory variability, suggesting right atrial pressure of 3 mmHg. IAS/Shunts: No atrial level shunt detected by color flow Doppler.  LEFT VENTRICLE PLAX 2D LVIDd:         4.03 cm  Diastology LVIDs:         2.44 cm  LV e' medial:    4.13 cm/s LV PW:         0.97 cm  LV E/e' medial:  16.7 LV IVS:        1.01 cm  LV e' lateral:   4.35 cm/s LVOT diam:     2.00 cm  LV E/e' lateral: 15.9 LV SV:         82 LV SV Index:   62 LVOT Area:     3.14 cm  RIGHT VENTRICLE RV Basal diam:  2.67 cm RV S prime:     12.50 cm/s TAPSE (M-mode): 3.0 cm LEFT ATRIUM             Index       RIGHT ATRIUM           Index LA diam:        3.70 cm 2.78 cm/m  RA Area:     15.00 cm LA Vol (A2C):   34.6 ml 25.98 ml/m RA Volume:   37.60 ml  28.23 ml/m LA Vol (A4C):   15.8 ml 11.86 ml/m LA Biplane Vol: 24.2 ml 18.17 ml/m  AORTIC VALVE                   PULMONIC VALVE AV Area (Vmax):    2.01 cm    PV Vmax:        0.56 m/s AV Area (Vmean):   2.25 cm    PV Peak grad:   1.3 mmHg AV Area (VTI):     3.33 cm    RVOT Peak grad: 3 mmHg AV Vmax:           127.50 cm/s AV Vmean:          85.100 cm/s AV VTI:            0.246 m AV Peak Grad:      6.5 mmHg AV Mean  Grad:      3.5 mmHg LVOT Vmax:         81.60 cm/s LVOT Vmean:  61.000 cm/s LVOT VTI:          0.261 m LVOT/AV VTI ratio: 1.06  AORTA Ao Root diam: 2.80 cm MITRAL VALVE                TRICUSPID VALVE MV Area (PHT): 1.77 cm     TR Peak grad:   46.0 mmHg MV Decel Time: 428 msec     TR Vmax:        339.00 cm/s MV E velocity: 69.00 cm/s MV A velocity: 133.00 cm/s  SHUNTS MV E/A ratio:  0.52         Systemic VTI:  0.26 m                             Systemic Diam: 2.00 cm Ida Rogue MD Electronically signed by Ida Rogue MD Signature Date/Time: 02/07/2020/3:13:01 PM    Final    DG HIP UNILAT WITH PELVIS 2-3 VIEWS LEFT  Result Date: 02/07/2020 CLINICAL DATA:  Left hip pain after a fall. EXAM: DG HIP (WITH OR WITHOUT PELVIS) 2-3V LEFT COMPARISON:  Earlier today, pelvic films. FINDINGS: AP view the pelvis and AP/frog leg views of the left hip. Osteopenia. Prior sacral plasty. Femoral heads are located. There is again clothing and/or skin fold artifacts projecting over the left proximal femur. No convincing evidence of displaced femur fracture. IMPRESSION: Better, but still somewhat limited evaluation of the proximal left femur/left hip. Correlate with overlying clothing artifact. If ongoing clinical suspicion of nondisplaced left hip fracture, consider CT. Electronically Signed   By: Abigail Miyamoto M.D.   On: 02/07/2020 05:56   ASSESSMENT AND PLAN:   Emma Kennedy is a 86 y.o. female with medical history significant of hypothyroidism, anemia, arthritis, diverticulosis, headache, dementia, who presents with fall and left hip pain. Per her daughter, it seems the patient fell while using a bedside commode at home.   Closed left hip fracture (HCC) status post mechanical fall at home - CT of her left hip showed intertrochanteric left femur fracture with comminution. Patient seems to have severe pain. No neurovascular compromise. -Orthopedic surgeon, Dr. Roland Rack was consulted -postop day # 0 Reduction and  internal fixation of displaced intertrochanteric left hip fracture with  nail. - Pain control: morphine prn and percocet - When necessary Zofran for nausea - Robaxin for muscle spasm -PT/OT when able to per ortho  RA (rheumatoid arthritis) ([place]): -Continue Plaquenil and sulfasalazine  Normocytic anemia: Hemoglobin 11.4 -Follow-up with CBC  Hypothyroidism: -Synthroid  Dementia without behavioral disturbance -Continue donepezil  Syncope and fall: Etiology for her syncope is not clear. - Orthostatic vital signs  stable - MRI-brain chronic small vessel disease. Left parietal scalp injury with stable - EEG pending - 2d echoLeft Ventricle: Left ventricular ejection fraction, by estimation, is 60  to 65%. The left ventricle has normal function.right ventricular  function normal.  Mild mitral valve regurgitation.  Tricuspid valve regurgitation is mild to moderate    DVT ppx: Lovenox Code Status: DNR per her daughter prior to admission Family Communication: Yes, patient's daughter at bed side Disposition Plan:   likely rehab Consults called:  Dr. Roland Rack Admission status: Med-surg bed as inpt    Status is: Inpatient  Remains inpatient appropriate because:Inpatient level of care appropriate due to severity of illness   Dispo: The patient is from: Home  Anticipated d/c is to: to be determined  Anticipated d/c date is: 2 days  Patient currently is  not medically stable to d/c. Patient is postop today. Needs PT OT and discharge planning     TOTAL TIME TAKING CARE OF THIS PATIENT: *25* minutes.  >50% time spent on counselling and coordination of care  Note: This dictation was prepared with Dragon dictation along with smaller phrase technology. Any transcriptional errors that result from this process are unintentional.  Fritzi Mandes M.D    [facility]   CC: Primary care physician; Adin Hector, MDPatient ID: Lenon Curt, female   DOB: [birth year], 86 y.o.   MRN: [id number]

## 2020-02-09 ENCOUNTER — Encounter: Payer: Self-pay | Admitting: Surgery

## 2020-02-09 LAB — CBC
HCT: 30.9 % — ABNORMAL LOW (ref 36.0–46.0)
Hemoglobin: 9.9 g/dL — ABNORMAL LOW (ref 12.0–15.0)
MCH: 30.8 pg (ref 26.0–34.0)
MCHC: 32 g/dL (ref 30.0–36.0)
MCV: 96.3 fL (ref 80.0–100.0)
Platelets: 170 10*3/uL (ref 150–400)
RBC: 3.21 MIL/uL — ABNORMAL LOW (ref 3.87–5.11)
RDW: 12.6 % (ref 11.5–15.5)
WBC: 10.6 10*3/uL — ABNORMAL HIGH (ref 4.0–10.5)
nRBC: 0 % (ref 0.0–0.2)

## 2020-02-09 LAB — BASIC METABOLIC PANEL
Anion gap: 8 (ref 5–15)
BUN: 19 mg/dL (ref 8–23)
CO2: 26 mmol/L (ref 22–32)
Calcium: 8.8 mg/dL — ABNORMAL LOW (ref 8.9–10.3)
Chloride: 104 mmol/L (ref 98–111)
Creatinine, Ser: 0.55 mg/dL (ref 0.44–1.00)
GFR, Estimated: >60 mL/min (ref >60)
Glucose, Bld: 111 mg/dL — ABNORMAL HIGH (ref 70–99)
Potassium: 3.7 mmol/L (ref 3.5–5.1)
Sodium: 138 mmol/L (ref 135–145)

## 2020-02-09 MED ORDER — INFLUENZA VAC A&B SA ADJ QUAD 0.5 ML IM PRSY
0.5000 mL | PREFILLED_SYRINGE | INTRAMUSCULAR | Status: DC
  Filled 2020-02-09 (×2): qty 0.5

## 2020-02-09 MED ORDER — HYDROXYCHLOROQUINE SULFATE 200 MG PO TABS
200.0000 mg | ORAL_TABLET | ORAL | Status: DC
  Administered 2020-02-11: 200 mg via ORAL
  Filled 2020-02-09 (×2): qty 1

## 2020-02-09 NOTE — Progress Notes (Signed)
Physical Therapy Treatment Patient Details Name: Emma Kennedy MRN: [id number] DOB: [birth year] Today's Date: 02/09/2020    History of Present Illness Pt is an 86 y.o. female presenting to hospital 10/9 s/p fall using BSC with LOC about 10 seconds.  Imaging showing intertrochanteric L femur fx with comminution; also B L5 pars defects with grade 2 L5-S1 anterolisthesis.  Pt s/p ORIF of closed displaced intertrochanteric L hip fx 02/08/20.  PMH includes dementia, h/o sacroplasty 07/2018, anemia, thyroid disease, diverticulosis, and h/o bladder and colon surgery.    PT Comments    Pt sleeping in chair upon PT arrival but woken with vc's.  Able to stand up to RW with min assist and then ambulate 12 feet with RW with min to mod assist x1 (vc's to increase UE support through RW to off-weight L LE with R LE advancement d/t L hip/thigh pain causing L knee to flex during L LE stance phase).  Limited distance ambulating d/t fatigue.  O2 sats 96% or greater on 1 L O2 via nasal cannula during sessions activities.  Will continue to progress pt with strengthening and progressive functional mobility per pt tolerance.    Follow Up Recommendations  SNF     Equipment Recommendations  Rolling walker with 5" wheels;Wheelchair (measurements PT);Wheelchair cushion (measurements PT) (youth sized)    Recommendations for Other Services OT consult     Precautions / Restrictions Precautions Precautions: Fall Restrictions Weight Bearing Restrictions: Yes LLE Weight Bearing: Weight bearing as tolerated    Mobility  Bed Mobility Overal bed mobility: Needs Assistance Bed Mobility: Sit to Supine     Sit to supine: Mod assist;Max assist   General bed mobility comments: assist for trunk and B LE's sit to semi-supine in bed (increased assist d/t fatigue post ambulation)  Transfers Overall transfer level: Needs assistance Equipment used: Rolling walker (2 wheeled) Transfers: Sit to/from Stand Sit to Stand:  Min assist Stand pivot transfers: Min assist;Mod assist       General transfer comment: vc's to scoot forward towards edge of chair prior to standing and vc's for UE placement for standing (min assist to steady once standing d/t posterior lean); min assist to control descent sitting down onto bed  Ambulation/Gait Ambulation/Gait assistance: Min assist;Mod assist Gait Distance (Feet): 12 Feet Assistive device: Rolling walker (2 wheeled)   Gait velocity: decreased   General Gait Details: antalgic; decreased stance time L LE; L knee flexing when advancing R LE intermittently requiring increased assist and vc's to increase UE support through RW (to offweight L LE when advancing R LE); vc's for walker use and stepping technique   Stairs             Wheelchair Mobility    Modified Rankin (Stroke Patients Only)       Balance Overall balance assessment: Needs assistance Sitting-balance support: No upper extremity supported;Feet supported Sitting balance-Leahy Scale: Good Sitting balance - Comments: steady sitting reaching within BOS   Standing balance support: Single extremity supported Standing balance-Leahy Scale: Poor Standing balance comment: pt requiring at least single UE support for static standing balance                            Cognition Arousal/Alertness: Awake/alert Behavior During Therapy: WFL for tasks assessed/performed Overall Cognitive Status: History of cognitive impairments - at baseline  General Comments: Oriented to at least person, place, and situation      Exercises     General Comments General comments (skin integrity, edema, etc.): minimal drainage noted proximal L hip dressing beginning/end of session.  Nursing cleared pt for participation in physical therapy.  Pt agreeable to PT session.  Pt's daughter present during session.      Pertinent Vitals/Pain Pain Assessment: Faces Faces  Pain Scale: Hurts a little bit (2/10 at rest end of session; 6-8/10 with walking) Pain Location: L hip/thigh Pain Descriptors / Indicators: Aching;Tender;Sore;Grimacing;Guarding Pain Intervention(s): Limited activity within patient's tolerance;Monitored during session;Repositioned    Home Living Family/patient expects to be discharged to:: Private residence Living Arrangements: Children (Lives with pt's daughter Emma Kennedy) Available Help at Discharge: Family Type of Home: House Home Access: Stairs to enter   Home Layout: Two level Home Equipment: [employer] - 4 wheels;Bedside commode;Grab bars - tub/shower;Shower seat - built in Additional Comments: Daughter Emma Kennedy stays with pt 6 days per week (at Emma Kennedy's home)    Prior Function Level of Independence: Needs assistance  Gait / Transfers Assistance Needed: Modified independent ambulating with 4ww; no other falls in last 6 months       PT Goals (current goals can now be found in the care plan section) Acute Rehab PT Goals Patient Stated Goal: to improve walking PT Goal Formulation: With patient Time For Goal Achievement: 02/23/20 Potential to Achieve Goals: Good Progress towards PT goals: Progressing toward goals    Frequency    BID      PT Plan Current plan remains appropriate    Co-evaluation              AM-PAC PT "6 Clicks" Mobility   Outcome Measure  Help needed turning from your back to your side while in a flat bed without using bedrails?: A Little Help needed moving from lying on your back to sitting on the side of a flat bed without using bedrails?: A Little Help needed moving to and from a bed to a chair (including a wheelchair)?: A Lot Help needed standing up from a chair using your arms (e.g., wheelchair or bedside chair)?: A Little Help needed to walk in hospital room?: A Lot Help needed climbing 3-5 steps with a railing? : Total 6 Click Score: 14    End of Session Equipment Utilized During Treatment: Gait  belt;Oxygen (1 L O2 via nasal cannula) Activity Tolerance: Patient limited by pain;Patient limited by fatigue Patient left: in bed;with call bell/phone within reach;with bed alarm set;with family/visitor present;with SCD's reapplied;Other (comment) (B heels floating; ice pack to L hip) Nurse Communication: Mobility status;Precautions;Weight bearing status PT Visit Diagnosis: Other abnormalities of gait and mobility (R26.89);Muscle weakness (generalized) (M62.81);History of falling (Z91.81);Difficulty in walking, not elsewhere classified (R26.2);Pain Pain - Right/Left: Left Pain - part of body: Hip     Time: 5732-2025 PT Time Calculation (min) (ACUTE ONLY): 28 min  Charges:  $Gait Training: 8-22 mins $Therapeutic Activity: 8-22 mins                    Leitha Bleak, PT 02/09/20, 2:30 PM

## 2020-02-09 NOTE — Progress Notes (Signed)
  Subjective: 1 Day Post-Op Procedure(s) (LRB): INTRAMEDULLARY (IM) NAIL INTERTROCHANTRIC (Left) Patient reports pain as mild.   Patient is well, and has had no acute complaints or problems PT and care management to assist with discharge planning, will likely need SNF upon discharge. Negative for chest pain and shortness of breath Fever: no Gastrointestinal:Negative for nausea and vomiting  Objective: Vital signs in last 24 hours: Temp:  [97.6 F (36.4 C)-98.7 F (37.1 C)] 98.4 F (36.9 C) (10/11 0727) Pulse Rate:  [71-82] 82 (10/11 0727) Resp:  [12-27] 16 (10/11 0727) BP: (118-132)/(60-69) 118/64 (10/11 0727) SpO2:  [84 %-100 %] 100 % (10/11 0727)  Intake/Output from previous day:  Intake/Output Summary (Last 24 hours) at 02/09/2020 0758 Last data filed at 02/08/2020 1500 Gross per 24 hour  Intake 2083.3 ml  Output 75 ml  Net 2008.3 ml    Intake/Output this shift: No intake/output data recorded.  Labs: Recent Labs    02/07/20 0332 02/08/20 0517 02/09/20 0348  HGB 11.4* 11.6* 9.9*   Recent Labs    02/08/20 0517 02/09/20 0348  WBC 9.9 10.6*  RBC 3.66* 3.21*  HCT 35.0* 30.9*  PLT 209 170   Recent Labs    02/07/20 0332 02/09/20 0348  NA 134* 138  K 3.8 3.7  CL 98 104  CO2 28 26  BUN 19 19  CREATININE 0.82 0.55  GLUCOSE 101* 111*  CALCIUM 9.4 8.8*   Recent Labs    02/07/20 1120  INR 1.0     EXAM General - Patient is Alert and Appropriate Extremity - ABD soft Sensation intact distally Intact pulses distally Dorsiflexion/Plantar flexion intact Incision: dressing C/D/I No cellulitis present  New honeycomb applied to the middle incision to the left leg. Dressing/Incision - clean, dry, no drainage Motor Function - intact, moving foot and toes well on exam.  Abdomen soft with normal bowel sounds.  Past Medical History:  Diagnosis Date  . Anemia 2005  . Arthritis   . Diverticulosis   . Headache 2013  . Osteoarthritis   . Osteoporosis   .  Thyroid disease     Assessment/Plan: 1 Day Post-Op Procedure(s) (LRB): INTRAMEDULLARY (IM) NAIL INTERTROCHANTRIC (Left) Principal Problem:   Closed left hip fracture (HCC) Active Problems:   RA (rheumatoid arthritis) (HCC)   Normocytic anemia   Hypothyroidism   Fall   Syncope   Dementia without behavioral disturbance (HCC)   Scalp laceration  Estimated body mass index is 18.78 kg/m as calculated from the following:   Height as of this encounter: 4\' 11"  (1.499 m).   Weight as of this encounter: 42.2 kg. Advance diet Up with therapy D/C IV fluids when tolerating po intake.  Labs reviewed this AM. Hg 9.9.   Up with therapy today. Begin working on a BM.  DVT Prophylaxis - Lovenox, Foot Pumps and TED hose Weight-Bearing as tolerated to left leg  J. Cameron Proud, PA-C [facility] 02/09/2020, 7:58 AM

## 2020-02-09 NOTE — Evaluation (Signed)
Physical Therapy Evaluation Patient Details Name: Emma Kennedy MRN: [id number] DOB: Sep 16, 1929 Today's Date: 02/09/2020   History of Present Illness  Pt is an 86 y.o. female presenting to hospital 10/9 s/p fall using BSC with LOC about 10 seconds.  Imaging showing intertrochanteric L femur fx with comminution; also B L5 pars defects with grade 2 L5-S1 anterolisthesis.  Pt s/p ORIF of closed displaced intertrochanteric L hip fx 02/08/20.  PMH includes dementia, h/o sacroplasty 07/2018, anemia, thyroid disease, diverticulosis, and h/o bladder and colon surgery.  Clinical Impression  Prior to hospital admission, pt was modified independent ambulating with rollator; lives with family who provide support.  Pt's O2 sats noted to be 83-84% on room air at rest beginning of session (checked with 2 different pulse oximeters and checked different fingers on different hands); pt placed on 2 L O2 via nasal cannula per discussion with pt's nurse and pt's O2 sats 99% or greater rest of session.  Currently pt is min assist semi-supine to sitting edge of bed; min assist to stand up to RW; min to mod assist to ambulate a few feet bed to recliner with RW; and then min to mod assist for transfers recliner to/from [facility] with RW (pt requesting to toilet after getting to recliner).  Increased assist required for transfers and ambulation d/t L knee flexing (d/t L hip/thigh pain) when advancing R LE.  Pain 0/10 L hip at beginning/end of session at rest; 7-8/10 L hip pain with activity.  Pt would benefit from skilled PT to address noted impairments and functional limitations (see below for any additional details).  Upon hospital discharge, pt would benefit from STR.    Follow Up Recommendations SNF    Equipment Recommendations  Rolling walker with 5" wheels;Wheelchair (measurements PT);Wheelchair cushion (measurements PT) (youth sized)    Recommendations for Other Services OT consult     Precautions / Restrictions  Precautions Precautions: Fall Restrictions Weight Bearing Restrictions: Yes LLE Weight Bearing: Weight bearing as tolerated      Mobility  Bed Mobility Overal bed mobility: Needs Assistance Bed Mobility: Supine to Sit     Supine to sit: Min assist;HOB elevated     General bed mobility comments: assist for L LE; vc's for technique and use of bed rail; increased effort and time to perform  Transfers Overall transfer level: Needs assistance Equipment used: Rolling walker (2 wheeled) Transfers: Sit to/from [facility] Sit to Stand: Min assist Stand pivot transfers: Min assist;Mod assist       General transfer comment: x1 transfer from bed, x1 transfer from recliner, x1 transfer from [facility]; vc's for UE/LE placement, scooting forward towards edge of sitting surface prior to standing, and overall technique to stand; stand step turn with RW recliner to/from [facility]  Ambulation/Gait Ambulation/Gait assistance: Min assist;Mod assist Gait Distance (Feet): 3 Feet (bed to recliner) Assistive device: Rolling walker (2 wheeled)   Gait velocity: decreased   General Gait Details: antalgic; decreased stance time L LE; L knee flexing when advancing R LE requiring increased assist; vc's for walker use and stepping technique  Stairs            Wheelchair Mobility    Modified Rankin (Stroke Patients Only)       Balance Overall balance assessment: Needs assistance Sitting-balance support: No upper extremity supported;Feet supported Sitting balance-Leahy Scale: Good Sitting balance - Comments: steady sitting reaching within BOS   Standing balance support: Single extremity supported Standing balance-Leahy Scale: Poor Standing balance comment: pt requiring  at least single UE support for static standing balance                             Pertinent Vitals/Pain Pain Assessment: 0-10 Pain Score: 0-No pain (0/10 at rest beginning/end of session; 7-8/10 with  activity) Pain Location: L hip/thigh Pain Descriptors / Indicators: Aching;Tender;Sore Pain Intervention(s): Limited activity within patient's tolerance;Monitored during session;Premedicated before session;Repositioned;Ice applied  HR WFL during sessions activities.    Home Living Family/patient expects to be discharged to:: Private residence Living Arrangements: Children (Lives with pt's daughter [name]) Available Help at Discharge: Family Type of Home: House Home Access: Stairs to enter   [employer] of Steps: 1 step down into basement (where pt resides) with L grab bar Home Layout: Two level Home Equipment: [place] - 4 wheels;Bedside commode;Grab bars - tub/shower;Shower seat - built in Additional Comments: Daughter [name] stays with pt 6 days per week (at [name]'s home)    Prior Function Level of Independence: Needs assistance   Gait / Transfers Assistance Needed: Modified independent ambulating with 4ww; no other falls in last 6 months           Hand Dominance        Extremity/Trunk Assessment   Upper Extremity Assessment Upper Extremity Assessment: Overall WFL for tasks assessed    Lower Extremity Assessment Lower Extremity Assessment: RLE deficits/detail;LLE deficits/detail RLE Deficits / Details: strength and ROM WFL LLE Deficits / Details: fair L quad set strength; at least 3/5 AROM DF/PF; at least 3-/5 L hip flexion and knee flexion/extension LLE: Unable to fully assess due to pain    Cervical / Trunk Assessment Cervical / Trunk Assessment: Normal  Communication   Communication: No difficulties  Cognition Arousal/Alertness: Awake/alert Behavior During Therapy: WFL for tasks assessed/performed Overall Cognitive Status: History of cognitive impairments - at baseline                                 General Comments: Oriented to at least person, place, and situation      General Comments General comments (skin integrity, edema,  etc.): minimal drainage noted proximal L hip dressing.  Nursing cleared pt for participation in physical therapy.  Pt agreeable to PT session.  Pt's daughter present during session.    Exercises Total Joint Exercises Ankle Circles/Pumps: AROM;Strengthening;Both;10 reps;Supine Quad Sets: AROM;Strengthening;Both;10 reps;Supine Short Arc Quad: AROM;Strengthening;Left;10 reps;Supine Heel Slides: AAROM;Strengthening;Left;10 reps;Supine Hip ABduction/ADduction: AAROM;Strengthening;Left;10 reps;Supine   Assessment/Plan    PT Assessment Patient needs continued PT services  PT Problem List Decreased strength;Decreased activity tolerance;Decreased balance;Decreased mobility;Decreased knowledge of use of DME;Decreased knowledge of precautions;Decreased skin integrity;Pain       PT Treatment Interventions DME instruction;Gait training;Stair training;Functional mobility training;Therapeutic activities;Therapeutic exercise;Balance training;Patient/family education    PT Goals (Current goals can be found in the Care Plan section)  Acute Rehab PT Goals Patient Stated Goal: to improve walking PT Goal Formulation: With patient Time For Goal Achievement: 02/23/20 Potential to Achieve Goals: Good    Frequency BID   Barriers to discharge Decreased caregiver support      Co-evaluation               AM-PAC PT "6 Clicks" Mobility  Outcome Measure Help needed turning from your back to your side while in a flat bed without using bedrails?: A Little Help needed moving from lying on your back to sitting on the side  of a flat bed without using bedrails?: A Little Help needed moving to and from a bed to a chair (including a wheelchair)?: A Lot Help needed standing up from a chair using your arms (e.g., wheelchair or bedside chair)?: A Little Help needed to walk in hospital room?: A Lot Help needed climbing 3-5 steps with a railing? : Total 6 Click Score: 14    End of Session Equipment Utilized  During Treatment: Gait belt;Oxygen (2 L O2 via nasal cannula) Activity Tolerance: Patient limited by pain Patient left: in chair;with call bell/phone within reach;with chair alarm set;with family/visitor present;with SCD's reapplied;Other (comment) (B heels floating via pillow support; ice pack to L hip) Nurse Communication: Mobility status;Precautions;Weight bearing status;Other (comment) (pt's O2 status) PT Visit Diagnosis: Other abnormalities of gait and mobility (R26.89);Muscle weakness (generalized) (M62.81);History of falling (Z91.81);Difficulty in walking, not elsewhere classified (R26.2);Pain Pain - Right/Left: Left Pain - part of body: Hip    Time: 0902-1017 PT Time Calculation (min) (ACUTE ONLY): 75 min   Charges:   PT Evaluation $PT Eval Low Complexity: 1 Low PT Treatments $Therapeutic Exercise: 8-22 mins $Therapeutic Activity: 23-37 mins       Leitha Bleak, PT 02/09/20, 11:47 AM

## 2020-02-09 NOTE — Progress Notes (Signed)
[facility] [place] NAME: Nataliyah Packham    MR#:  [id number]  DATE OF BIRTH:  07-14-1929  SUBJECTIVE:   Came in after having a mechanical fall at home.  Daughter at bedside. Patient sitting out in the chair. Work with physical therapy.  Denies any pain.   REVIEW OF SYSTEMS:   Review of Systems  Constitutional: Negative for chills, fever and weight loss.  HENT: Negative for ear discharge, ear pain and nosebleeds.   Eyes: Negative for blurred vision, pain and discharge.  Respiratory: Negative for sputum production, shortness of breath, wheezing and stridor.   Cardiovascular: Negative for chest pain, palpitations, orthopnea and PND.  Gastrointestinal: Negative for abdominal pain, diarrhea, nausea and vomiting.  Genitourinary: Negative for frequency and urgency.  Musculoskeletal: Negative for back pain and joint pain.  Neurological: Negative for sensory change, speech change, focal weakness and weakness.  Psychiatric/Behavioral: Negative for depression and hallucinations. The patient is not nervous/anxious.    Tolerating Diet:yes Tolerating PT: recommends rehab  DRUG ALLERGIES:   Allergies  Allergen Reactions  . Hydrocodone-Acetaminophen Shortness Of Breath, Nausea Only and Other (See Comments)    Causes severe dizziness  . Chloride Other (See Comments)    unknown  . Clinoril [Sulindac] Other (See Comments)    Stomach pain  . Etodolac Other (See Comments)    unknown  . Meloxicam Other (See Comments)    unknown  . Methotrexate Derivatives Other (See Comments)    unknown  . Prednisone Swelling    Ok to take low dose for short amount of time.  . Relafen [Nabumetone] Other (See Comments)    unknown  . Trospium Other (See Comments)    unknown    VITALS:  Blood pressure 132/62, pulse 76, temperature 98.5 F (36.9 C), resp. rate 15, height 4\' 11"  (1.499 m), weight 42.2 kg, SpO2 100 %.  PHYSICAL EXAMINATION:   Physical  Exam  GENERAL:  86 y.o.-year-old patient lying in the bed with no acute distress.  HEENT: Head atraumatic, normocephalic. Oropharynx and nasopharynx clear.  LUNGS: Normal breath sounds bilaterally, no wheezing, rales, rhonchi. No use of accessory muscles of respiration.  CARDIOVASCULAR: S1, S2 normal. No murmurs, rubs, or gallops.  ABDOMEN: Soft, nontender, nondistended. Bowel sounds present. No organomegaly or mass.  EXTREMITIES: No cyanosis, clubbing or edema b/l.    NEUROLOGIC: Cranial nerves II through XII are intact. No focal Motor or sensory deficits b/l.   PSYCHIATRIC:  patient is alert and awake.  SKIN: No obvious rash, lesion, or ulcer.   LABORATORY PANEL:  CBC Recent Labs  Lab 02/09/20 0348  WBC 10.6*  HGB 9.9*  HCT 30.9*  PLT 170    Chemistries  Recent Labs  Lab 02/07/20 0332 02/07/20 0332 02/09/20 0348  NA 134*   < > 138  K 3.8   < > 3.7  CL 98   < > 104  CO2 28   < > 26  GLUCOSE 101*   < > 111*  BUN 19   < > 19  CREATININE 0.82   < > 0.55  CALCIUM 9.4   < > 8.8*  AST 26  --   --   ALT 18  --   --   ALKPHOS 44  --   --   BILITOT 0.5  --   --    < > = values in this interval not displayed.   Cardiac Enzymes No results for input(s): TROPONINI in the  last 168 hours. RADIOLOGY:  MR BRAIN WO CONTRAST  Result Date: 02/07/2020 CLINICAL DATA:  Syncopal episode with fall trauma to the head. EXAM: MRI HEAD WITHOUT CONTRAST TECHNIQUE: Multiplanar, multiecho pulse sequences of the brain and surrounding structures were obtained without intravenous contrast. COMPARISON:  Head CT same day.  MRI 07/09/2019. FINDINGS: Brain: Diffusion imaging does not show any acute or subacute infarction. Brainstem and cerebellum appear normal. Cerebral hemispheres show moderate chronic small-vessel ischemic changes of the deep and subcortical white matter. Cortical or large vessel territory infarction. Mass lesion, hemorrhage hydrocephalus or extra-axial collection. Vascular: Major  vessels at the base of the brain show flow. Skull and upper cervical spine: Negative Sinuses/Orbits: Clear/normal Other: Left parietal scalp injury with staple. IMPRESSION: No acute intracranial finding. Moderate chronic small-vessel ischemic changes of the cerebral hemispheric white matter. Left parietal scalp injury. Electronically Signed   By: Nelson Chimes M.D.   On: 02/07/2020 14:10   ECHOCARDIOGRAM COMPLETE  Result Date: 02/07/2020    ECHOCARDIOGRAM REPORT   Patient Name:   DYANARA COZZA Date of Exam: 02/07/2020 Medical Rec #:  [id number]      Height:       59.0 in Accession #:    [id number]     Weight:       93.0 lb Date of Birth:  [birth year]       BSA:          1.332 m Patient Age:    86 years       BP:           117/70 mmHg Patient Gender: F              HR:           62 bpm. Exam Location:  [facility] Procedure: 2D Echo, Cardiac Doppler and Color Doppler Indications:     Syncope 780.2  History:         Patient has no prior history of Echocardiogram examinations.                  Headache.  Sonographer:     Sherrie Sport RDCS (AE) Referring Phys:  [id number] Soledad Gerlach NIU Diagnosing Phys: Ida Rogue MD IMPRESSIONS  1. Left ventricular ejection fraction, by estimation, is 60 to 65%. The left ventricle has normal function. The left ventricle has no regional wall motion abnormalities. There is mild left ventricular hypertrophy. Left ventricular diastolic parameters are consistent with Grade I diastolic dysfunction (impaired relaxation).  2. Right ventricular systolic function is normal. The right ventricular size is normal. There is moderately elevated pulmonary artery systolic pressure. The estimated right ventricular systolic pressure is 82.9 mmHg.  3. Mild mitral valve regurgitation.  4. Tricuspid valve regurgitation is mild to moderate. FINDINGS  Left Ventricle: Left ventricular ejection fraction, by estimation, is 60 to 65%. The left ventricle has normal function. The left ventricle has no regional wall motion  abnormalities. The left ventricular internal cavity size was normal in size. There is  mild left ventricular hypertrophy. Left ventricular diastolic parameters are consistent with Grade I diastolic dysfunction (impaired relaxation). Right Ventricle: The right ventricular size is normal. No increase in right ventricular wall thickness. Right ventricular systolic function is normal. There is moderately elevated pulmonary artery systolic pressure. The tricuspid regurgitant velocity is 3.39 m/s, and with an assumed right atrial pressure of 5 mmHg, the estimated right ventricular systolic pressure is 93.7 mmHg. Left Atrium: Left atrial size was normal in size. Right Atrium: Right atrial size  was normal in size. Pericardium: There is no evidence of pericardial effusion. Mitral Valve: The mitral valve is normal in structure. Mild mitral annular calcification. Mild mitral valve regurgitation. No evidence of mitral valve stenosis. Tricuspid Valve: The tricuspid valve is normal in structure. Tricuspid valve regurgitation is mild to moderate. No evidence of tricuspid stenosis. Aortic Valve: The aortic valve is normal in structure. Aortic valve regurgitation is trivial. No aortic stenosis is present. Aortic valve mean gradient measures 3.5 mmHg. Aortic valve peak gradient measures 6.5 mmHg. Aortic valve area, by VTI measures 3.33 cm. Pulmonic Valve: The pulmonic valve was normal in structure. Pulmonic valve regurgitation is not visualized. No evidence of pulmonic stenosis. Aorta: The aortic root is normal in size and structure. Venous: The inferior vena cava is normal in size with greater than 50% respiratory variability, suggesting right atrial pressure of 3 mmHg. IAS/Shunts: No atrial level shunt detected by color flow Doppler.  LEFT VENTRICLE PLAX 2D LVIDd:         4.03 cm  Diastology LVIDs:         2.44 cm  LV e' medial:    4.13 cm/s LV PW:         0.97 cm  LV E/e' medial:  16.7 LV IVS:        1.01 cm  LV e' lateral:    4.35 cm/s LVOT diam:     2.00 cm  LV E/e' lateral: 15.9 LV SV:         82 LV SV Index:   62 LVOT Area:     3.14 cm  RIGHT VENTRICLE RV Basal diam:  2.67 cm RV S prime:     12.50 cm/s TAPSE (M-mode): 3.0 cm LEFT ATRIUM             Index       RIGHT ATRIUM           Index LA diam:        3.70 cm 2.78 cm/m  RA Area:     15.00 cm LA Vol (A2C):   34.6 ml 25.98 ml/m RA Volume:   37.60 ml  28.23 ml/m LA Vol (A4C):   15.8 ml 11.86 ml/m LA Biplane Vol: 24.2 ml 18.17 ml/m  AORTIC VALVE                   PULMONIC VALVE AV Area (Vmax):    2.01 cm    PV Vmax:        0.56 m/s AV Area (Vmean):   2.25 cm    PV Peak grad:   1.3 mmHg AV Area (VTI):     3.33 cm    RVOT Peak grad: 3 mmHg AV Vmax:           127.50 cm/s AV Vmean:          85.100 cm/s AV VTI:            0.246 m AV Peak Grad:      6.5 mmHg AV Mean Grad:      3.5 mmHg LVOT Vmax:         81.60 cm/s LVOT Vmean:        61.000 cm/s LVOT VTI:          0.261 m LVOT/AV VTI ratio: 1.06  AORTA Ao Root diam: 2.80 cm MITRAL VALVE                TRICUSPID VALVE MV Area (PHT): 1.77 cm  TR Peak grad:   46.0 mmHg MV Decel Time: 428 msec     TR Vmax:        339.00 cm/s MV E velocity: 69.00 cm/s MV A velocity: 133.00 cm/s  SHUNTS MV E/A ratio:  0.52         Systemic VTI:  0.26 m                             Systemic Diam: 2.00 cm Ida Rogue MD Electronically signed by Ida Rogue MD Signature Date/Time: 02/07/2020/3:13:01 PM    Final    DG HIP OPERATIVE UNILAT W OR W/O PELVIS LEFT  Result Date: 02/08/2020 CLINICAL DATA:  Intramedullary rod fixation EXAM: OPERATIVE LEFT HIP (WITH PELVIS IF PERFORMED)  VIEWS TECHNIQUE: Fluoroscopic spot image(s) were submitted for interpretation post-operatively. COMPARISON:  February 07, 2020 FINDINGS: Spot fluoroscopy images were obtained for surgical planning purposes. Inter trochanteric fracture is visualized. Fluoroscopic images demonstrate placement of an intramedullary rod in expected alignment. Osteopenia. Fluoro time 36 seconds.  IMPRESSION: Spot fluoroscopy images were obtained for surgical planning purposes. Electronically Signed   By: Valentino Saxon MD   On: 02/08/2020 16:28   ASSESSMENT AND PLAN:   MILISA KIMBELL is a 86 y.o. female with medical history significant of hypothyroidism, anemia, arthritis, diverticulosis, headache, dementia, who presents with fall and left hip pain. Per her daughter, it seems the patient fell while using a bedside commode at home.   Closed left hip fracture (HCC) status post mechanical fall at home - CT of her left hip showed intertrochanteric left femur fracture with comminution. Patient seems to have severe pain. No neurovascular compromise. -Orthopedic surgeon, Dr. Roland Rack was consulted -postop day # 1 S/P Reduction and internal fixation of displaced intertrochanteric left hip fracture with  nail. - Pain control: morphine prn and percocet - When necessary Zofran for nausea - Robaxin for muscle spasm -PT/OT recommends rehab. -TOC for discharge planning  RA (rheumatoid arthritis) ([place]): -Continue Plaquenil and sulfasalazine  Normocytic anemia: Hemoglobin 11.4 -hemoglobin 9.9 expected postoperatively  Hypothyroidism: -Synthroid  Dementia without behavioral disturbance -Continue donepezil  Syncope and fall: Etiology for her syncope is not clear. - Orthostatic vital signs  stable - MRI-brain chronic small vessel disease. Left parietal scalp injury with stable - EEG canceled-- daughter aware and agreeable. There is no history of seizures according to daughter. -Patient was seen by neurology Dr. Manuella Ghazi as outpatient. She had EEG done in April 2021. No epileptiform activity was noted. -No seizures or any seizure like activity noted by staff or family while patient has been in the ER and at home. - 2d echoLeft Ventricle: Left ventricular ejection fraction, by estimation, is 60  to 65%. The left ventricle has normal function.right ventricular  function normal.  Mild mitral  valve regurgitation.  Tricuspid valve regurgitation is mild to moderate    DVT ppx: Lovenox Code Status: DNR per her daughter prior to admission Family Communication: Yes, patient's daughter at bed side Disposition Plan:   likely rehab Consults called:  Dr. Roland Rack Admission status: Med-surg bed as inpt    Status is: Inpatient  Remains inpatient appropriate because:Inpatient level of care appropriate due to severity of illness   Dispo: The patient is from: Home  Anticipated d/c is to: to be determined  Anticipated d/c date is: 2 days  Patient currently is  medically stable to d/c. Patient is postop day 1  evaluated by physical therapy recommends rehab.  TOC to work on rehab placement.    TOTAL TIME TAKING CARE OF THIS PATIENT: *25* minutes.  >50% time spent on counselling and coordination of care  Note: This dictation was prepared with Dragon dictation along with smaller phrase technology. Any transcriptional errors that result from this process are unintentional.  Fritzi Mandes M.D    [facility]   CC: Primary care physician; Adin Hector, MDPatient ID: Lenon Curt, female   DOB: March 06, 1930, 86 y.o.   MRN: [id number]

## 2020-02-09 NOTE — Plan of Care (Signed)

## 2020-02-09 NOTE — Evaluation (Signed)
Occupational Therapy Evaluation Patient Details Name: Emma Kennedy MRN: [id number] DOB: 1929-11-04 Today's Date: 02/09/2020    History of Present Illness Pt is an 86 y.o. female presenting to hospital 10/9 s/p fall using BSC with LOC about 10 seconds.  Imaging showing intertrochanteric L femur fx with comminution; also B L5 pars defects with grade 2 L5-S1 anterolisthesis.  Pt s/p ORIF of closed displaced intertrochanteric L hip fx 02/08/20.  PMH includes dementia, h/o sacroplasty 07/2018, anemia, thyroid disease, diverticulosis, and h/o bladder and colon surgery.   Clinical Impression   Pt was seen for OT evaluation this date, POD #1 from ORIF to L hip. Prior to hospital admission, pt was INDEP with basic self care with exception of bathing-SETUP/SUPV and family assists for IADLs. In addition, pt performs MOD I fxl mobility at baseline with 4WW. Pt lives with daughter [name] and her other daughter, [name] stays with pt 6 days/wk. Currently pt demonstrates impairments as described below (See OT problem list) which functionally limit her ability to perform ADL/self-care tasks. Pt currently requires MIN A for ADL transfers and MOD A for LB ADLs d/t limited L hip ROM post-op'ly.  OT facilitates ed re: LB ADLs modifications with AE. Pt and dtr with good reception, but will require f/u training. Pt would benefit from skilled OT services to address noted impairments and functional limitations (see below for any additional details) in order to maximize safety and independence while minimizing falls risk and caregiver burden. Upon hospital discharge, recommend STR to maximize pt safety and return to PLOF. Pt wanting to go home, will continue to assess for most appropriate d/c planning.    Follow Up Recommendations  SNF    Equipment Recommendations  None recommended by OT (pt reports having all necessary equipment)    Recommendations for Other Services       Precautions / Restrictions  Precautions Precautions: Fall Restrictions Weight Bearing Restrictions: Yes LLE Weight Bearing: Weight bearing as tolerated      Mobility Bed Mobility Overal bed mobility: Needs Assistance Bed Mobility: Supine to Sit;Sit to Supine     Supine to sit: Min assist;HOB elevated Sit to supine: Mod assist   General bed mobility comments: MIN A to assist with trunk to come to sitting. MOD A to manage LEs back to bed.  Transfers Overall transfer level: Needs assistance Equipment used: Rolling walker (2 wheeled) Transfers: Sit to/from Stand Sit to Stand: Min assist Stand pivot transfers: Min assist;Mod assist       General transfer comment: vc's to scoot forward towards edge of chair prior to standing and vc's for UE placement for standing (min assist to steady once standing d/t posterior lean); min assist to control descent sitting down onto bed    Balance Overall balance assessment: Needs assistance Sitting-balance support: No upper extremity supported;Feet supported Sitting balance-Leahy Scale: Good Sitting balance - Comments: steady sitting reaching within BOS   Standing balance support: Single extremity supported Standing balance-Leahy Scale: Poor Standing balance comment: requires at least MIN A and UE support to sustain static stand                           ADL either performed or assessed with clinical judgement   ADL Overall ADL's : Needs assistance/impaired  General ADL Comments: SETUP for seated UB ADLs, MOD A for LB ADLs. MIN A for ADL trasnfers with RW.     Vision Baseline Vision/History: Wears glasses Wears Glasses: At all times Patient Visual Report: No change from baseline       Perception     Praxis      Pertinent Vitals/Pain Pain Assessment: 0-10 Pain Score: 7  Faces Pain Scale: Hurts a little bit (2/10 at rest end of session; 6-8/10 with walking) Pain Location: L hip/thigh Pain  Descriptors / Indicators: Aching;Operative site guarding Pain Intervention(s): Limited activity within patient's tolerance;Monitored during session;Repositioned;Patient requesting pain meds-RN notified;Ice applied     Hand Dominance Right   Extremity/Trunk Assessment Upper Extremity Assessment Upper Extremity Assessment: Overall WFL for tasks assessed   Lower Extremity Assessment Lower Extremity Assessment: Defer to PT evaluation;LLE deficits/detail LLE: Unable to fully assess due to pain   Cervical / Trunk Assessment Cervical / Trunk Assessment: Normal   Communication Communication Communication: No difficulties   Cognition Arousal/Alertness: Awake/alert Behavior During Therapy: WFL for tasks assessed/performed Overall Cognitive Status: History of cognitive impairments - at baseline                                 General Comments: Pt oriented to person, place, and situation, some aspects of time. Able to follow one step commands consistently, some increased time for processing.   General Comments  all dressings on L LE intact. Only most proximal honeycomb with minimal old (dark) sanguinous drainage.    Exercises Other Exercises Other Exercises: OT facilitates ed re: shower guidelines for after sx, safety with use of RW-hand placement, use of AE for LB ADLs PRN. Pt with good understanding, but will require f/u education including demonstration and trial training. Handout provided. Pt's daughter [name] present throughout and with good understanding of ed provided.   Shoulder Instructions      Home Living Family/patient expects to be discharged to:: Private residence Living Arrangements: Children [name]) Available Help at Discharge: Family (daughter [name] stays with pt during the day) Type of Home: House Home Access: Stairs to enter [employer] of Steps: 1 step down into basement (where pt resides) with L grab bar   Home Layout: Two level Alternate  Level Stairs-Number of Steps: chair lift to main floor (pt only goes upstairs with assist; stays in basement)   Bathroom Shower/Tub: Occupational psychologist: [place]: Environmental consultant - 4 wheels;Bedside commode;Grab bars - tub/shower;Shower seat - built in   Additional Comments: Daughter [name] stays with pt 6 days per week (at [name]'s home)      Prior Functioning/Environment Level of Independence: Needs assistance  Gait / Transfers Assistance Needed: Modified independent ambulating with 4ww; no other falls in last 6 months ADL's / Homemaking Assistance Needed: SETUP/SUPV for showers 1x/wk, usually sink-bathes otherwise. Family assists with IADLs including med mgt. [name] usually drives pt to appts.            OT Problem List: Decreased strength;Decreased range of motion;Decreased activity tolerance;Impaired balance (sitting and/or standing);Decreased safety awareness;Decreased knowledge of use of DME or AE;Pain      OT Treatment/Interventions: Self-care/ADL training;DME and/or AE instruction;Therapeutic activities;Balance training;Therapeutic exercise;Energy conservation;Patient/family education    OT Goals(Current goals can be found in the care plan section) Acute Rehab OT Goals Patient Stated Goal: to improve walking OT Goal Formulation: With patient Time For Goal Achievement: 02/23/20  Potential to Achieve Goals: Good ADL Goals Pt Will Perform Lower Body Dressing: with supervision;with adaptive equipment;sit to/from stand Pt Will Transfer to Toilet: with supervision;ambulating;grab bars (with LRAD) Pt Will Perform Toileting - Clothing Manipulation and hygiene: with supervision;sit to/from stand Pt/caregiver will Perform Home Exercise Program: Increased strength;Both right and left upper extremity;With Supervision  OT Frequency: Min 2X/week   Barriers to D/C:            Co-evaluation              AM-PAC OT "6 Clicks" Daily Activity     Outcome  Measure Help from another person eating meals?: None Help from another person taking care of personal grooming?: A Little Help from another person toileting, which includes using toliet, bedpan, or urinal?: A Lot Help from another person bathing (including washing, rinsing, drying)?: A Lot Help from another person to put on and taking off regular upper body clothing?: A Little Help from another person to put on and taking off regular lower body clothing?: A Lot 6 Click Score: 16   End of Session Equipment Utilized During Treatment: Gait belt;Rolling walker;Oxygen (1Lnc) Nurse Communication: Patient requests pain meds  Activity Tolerance: Patient tolerated treatment well Patient left: in bed;with call bell/phone within reach;with bed alarm set  OT Visit Diagnosis: Unsteadiness on feet (R26.81);Other abnormalities of gait and mobility (R26.89);Muscle weakness (generalized) (M62.81);History of falling (Z91.81)                Time: 4854-6270 OT Time Calculation (min): 38 min Charges:  OT General Charges $OT Visit: 1 Visit OT Evaluation $OT Eval Moderate Complexity: 1 Mod OT Treatments $Self Care/Home Management : 8-22 mins $Therapeutic Activity: 8-22 mins  Gerrianne Scale, MS, OTR/L ascom [phone] 02/09/20, 5:10 PM

## 2020-02-10 LAB — CBC
HCT: 26.7 % — ABNORMAL LOW (ref 36.0–46.0)
Hemoglobin: 9 g/dL — ABNORMAL LOW (ref 12.0–15.0)
MCH: 31.8 pg (ref 26.0–34.0)
MCHC: 33.7 g/dL (ref 30.0–36.0)
MCV: 94.3 fL (ref 80.0–100.0)
Platelets: 165 10*3/uL (ref 150–400)
RBC: 2.83 MIL/uL — ABNORMAL LOW (ref 3.87–5.11)
RDW: 12.5 % (ref 11.5–15.5)
WBC: 8.1 10*3/uL (ref 4.0–10.5)
nRBC: 0 % (ref 0.0–0.2)

## 2020-02-10 MED ORDER — TRAMADOL HCL 50 MG PO TABS: 50.0000 mg | ORAL_TABLET | Freq: Four times a day (QID) | ORAL | 0 refills | Status: AC | PRN

## 2020-02-10 MED ORDER — ENOXAPARIN SODIUM 30 MG/0.3ML ~~LOC~~ SOLN: 30.0000 mg | SUBCUTANEOUS | 0 refills | Status: AC

## 2020-02-10 NOTE — Progress Notes (Signed)
Physical Therapy Treatment Patient Details Name: Emma Kennedy MRN: [id number] DOB: Jan 07, 1930 Today's Date: 02/10/2020    History of Present Illness Pt is an 86 y.o. female presenting to hospital 10/9 s/p fall using BSC with LOC about 10 seconds.  Imaging showing intertrochanteric L femur fx with comminution; also B L5 pars defects with grade 2 L5-S1 anterolisthesis.  Pt s/p ORIF of closed displaced intertrochanteric L hip fx 02/08/20.  PMH includes dementia, h/o sacroplasty 07/2018, anemia, thyroid disease, diverticulosis, and h/o bladder and colon surgery.    PT Comments    Pt resting in bed upon PT arrival; daughter present.  Tolerated LE ex's in bed fairly well.  Min assist semi-supine to sitting edge of bed; CGA to min assist with transfers; and CGA to min to mod assist with ambulation (pt requiring vc's to off-weight L LE via B UE support on RW d/t L hip/thigh pain with walking).  Pt's O2 sats 84-85% on room air at rest in bed beginning of session but improved to 91% with cueing for pursed lip breathing.  Pt's O2 sats 88-90% on room air after transferring to recliner (and pt appearing SOB).  Per discussion with pt's nurse, utilized 2 L O2 via nasal cannula with ambulation and pt's O2 sats 95% or greater (pt titrated back to room air end of session and O2 sats 92% resting in recliner).  MD Posey Pronto notified regarding O2 sats and O2 use during session.  Will continue to progress pt with strengthening and progressive functional mobility per pt tolerance.   Follow Up Recommendations  SNF     Equipment Recommendations  Rolling walker with 5" wheels;Wheelchair (measurements PT);Wheelchair cushion (measurements PT)    Recommendations for Other Services OT consult     Precautions / Restrictions Precautions Precautions: Fall Restrictions Weight Bearing Restrictions: Yes LLE Weight Bearing: Weight bearing as tolerated    Mobility  Bed Mobility Overal bed mobility: Needs Assistance Bed  Mobility: Supine to Sit     Supine to sit: Min assist;HOB elevated     General bed mobility comments: assist for trunk semi-supine to sitting edge of bed; increased effort and time to perform  Transfers Overall transfer level: Needs assistance Equipment used: Rolling walker (2 wheeled) Transfers: Sit to/from Stand Sit to Stand: Min guard;Min assist (x1 trial from bed and x2 trials from recliner) Stand pivot transfers: Min guard;Min assist (stand step turn bed to recliner with RW)       General transfer comment: vc's for UE placement and walker use  Ambulation/Gait Ambulation/Gait assistance: Min guard;Min assist;Mod assist Gait Distance (Feet): 20 Feet Assistive device: Rolling walker (2 wheeled)   Gait velocity: decreased   General Gait Details: antalgic; decreased stance time L LE; L knee flexing when advancing R LE occasionally requiring increased assist and vc's to increase UE support through RW (to offweight L LE when advancing R LE); vc's for walker use and stepping technique   Stairs             Wheelchair Mobility    Modified Rankin (Stroke Patients Only)       Balance Overall balance assessment: Needs assistance Sitting-balance support: No upper extremity supported;Feet supported Sitting balance-Leahy Scale: Good Sitting balance - Comments: steady sitting reaching within BOS   Standing balance support: Single extremity supported Standing balance-Leahy Scale: Poor Standing balance comment: requiring at least single UE support for static standing balance  Cognition Arousal/Alertness: Awake/alert Behavior During Therapy: WFL for tasks assessed/performed Overall Cognitive Status: History of cognitive impairments - at baseline                                 General Comments: Required increased time for cueing and 1 step commands at times.      Exercises Total Joint Exercises Ankle Circles/Pumps:  AROM;Strengthening;Both;10 reps;Supine Quad Sets: AROM;Strengthening;Both;10 reps;Supine Gluteal Sets: AROM;Strengthening;Both;10 reps;Supine Towel Squeeze: AROM;Strengthening;Both;10 reps;Supine Short Arc Quad: AROM;Strengthening;Left;10 reps;Supine Heel Slides: AAROM;Strengthening;Left;10 reps;Supine Hip ABduction/ADduction: AAROM;Strengthening;Left;10 reps;Supine    General Comments General comments (skin integrity, edema, etc.): mild drainage most proximal honeycomb dressing L thigh.  Nursing cleared pt for participation in physical therapy.  Pt agreeable to PT session.  Pt's daughter [name] present during session.      Pertinent Vitals/Pain Pain Assessment: Faces Faces Pain Scale: Hurts a little bit (2/10 at rest; 6/10 with activity) Pain Location: L hip/thigh Pain Descriptors / Indicators: Grimacing;Discomfort;Guarding Pain Intervention(s): Limited activity within patient's tolerance;Monitored during session;Premedicated before session;Repositioned  HR WFL during sessions activities.    Home Living                      Prior Function            PT Goals (current goals can now be found in the care plan section) Acute Rehab PT Goals Patient Stated Goal: to improve walking PT Goal Formulation: With patient/family Time For Goal Achievement: 02/23/20 Potential to Achieve Goals: Good Progress towards PT goals: Progressing toward goals    Frequency    BID      PT Plan Current plan remains appropriate    Co-evaluation              AM-PAC PT "6 Clicks" Mobility   Outcome Measure  Help needed turning from your back to your side while in a flat bed without using bedrails?: A Little Help needed moving from lying on your back to sitting on the side of a flat bed without using bedrails?: A Little Help needed moving to and from a bed to a chair (including a wheelchair)?: A Little Help needed standing up from a chair using your arms (e.g., wheelchair or bedside  chair)?: A Little Help needed to walk in hospital room?: A Lot Help needed climbing 3-5 steps with a railing? : Total 6 Click Score: 15    End of Session Equipment Utilized During Treatment: Gait belt;Oxygen (2 L O2 via nasal cannula for ambulation) Activity Tolerance: Patient limited by pain;Patient limited by fatigue Patient left: in chair;with call bell/phone within reach;with chair alarm set;with family/visitor present;with SCD's reapplied;Other (comment) (B heels floating via pillow support) Nurse Communication: Mobility status;Precautions;Weight bearing status;Other (comment) (pt's O2 sats) PT Visit Diagnosis: Other abnormalities of gait and mobility (R26.89);Muscle weakness (generalized) (M62.81);History of falling (Z91.81);Difficulty in walking, not elsewhere classified (R26.2);Pain Pain - Right/Left: Left Pain - part of body: Hip     Time: 8325-4982 PT Time Calculation (min) (ACUTE ONLY): 67 min  Charges:  $Gait Training: 8-22 mins $Therapeutic Exercise: 8-22 mins $Therapeutic Activity: 23-37 mins                    Leitha Bleak, PT 02/10/20, 11:19 AM

## 2020-02-10 NOTE — NC FL2 (Signed)
[place] LEVEL OF CARE SCREENING TOOL     IDENTIFICATION  Patient Name: Emma Kennedy Birthdate: Jul 30, 1929 Sex: female Admission Date (Current Location): 02/07/2020  [place] and [place] Number:  Engineering geologist and Address:  [facility], [address], [place], [place] [address]      Provider Number: [id number]  Attending Physician Name and Address:  Fritzi Mandes, MD  Relative Name and Phone Number:  Shawnee Knapp [phone]    Current Level of Care: Hospital Recommended Level of Care: [place] Prior Approval Number:    Date Approved/Denied:   PASRR Number:    Discharge Plan: SNF    Current Diagnoses: Patient Active Problem List   Diagnosis Date Noted  . Scalp laceration   . Closed left hip fracture ([place]) 02/07/2020  . Syncope 02/07/2020  . Dementia without behavioral disturbance ([place]) 02/07/2020  . RA (rheumatoid arthritis) ([place])   . Normocytic anemia   . Hypothyroidism   . Fall     Orientation RESPIRATION BLADDER Height & Weight     Self, Time, Situation, Place  Normal External catheter Weight: 42.2 kg Height:  4\' 11"  (149.9 cm)  BEHAVIORAL SYMPTOMS/MOOD NEUROLOGICAL BOWEL NUTRITION STATUS      Continent Diet (Heart Healthy, Carb Modified)  AMBULATORY STATUS COMMUNICATION OF NEEDS Skin   Extensive Assist Verbally Surgical wounds                       Personal Care Assistance Level of Assistance  Bathing, Dressing, Feeding Bathing Assistance: Limited assistance Feeding assistance: Independent Dressing Assistance: Limited assistance     Functional Limitations Info  Sight, Hearing, Speech Sight Info: Adequate Hearing Info: Adequate Speech Info: Adequate    SPECIAL CARE FACTORS FREQUENCY  PT (By licensed PT), OT (By licensed OT)                    Contractures Contractures Info: Not present    Additional Factors Info  Code Status, Allergies Code Status Info:  Full Allergies Info: Hydrocodone/APAP, Chloride, Clinoril, Etodolac, Meloxicam, Methotrexate, Prednisone, Relafen, Trospium           Current Medications (02/10/2020):  This is the current hospital active medication list Current Facility-Administered Medications  Medication Dose Route Frequency Provider Last Rate Last Admin  . acetaminophen (TYLENOL) tablet 650 mg  650 mg Oral Q6H PRN Ivor Costa, MD   650 mg at 02/10/20 0849  . ALPRAZolam Duanne Moron) tablet 0.25 mg  0.25 mg Oral BID PRN Fritzi Mandes, MD   0.25 mg at 02/08/20 2000  . aspirin EC tablet 81 mg  81 mg Oral Daily Ivor Costa, MD   81 mg at 02/10/20 9794  . bacitracin ointment   Topical BID Fritzi Mandes, MD   Given at 02/10/20 [phone]  . bisacodyl (DULCOLAX) suppository 10 mg  10 mg Rectal Daily PRN Poggi, Marshall Cork, MD      . cholecalciferol (VITAMIN D3) tablet 1,000 Units  1,000 Units Oral Daily Ivor Costa, MD   1,000 Units at 02/10/20 0907  . diphenhydrAMINE (BENADRYL) 12.5 MG/5ML elixir 12.5-25 mg  12.5-25 mg Oral Q4H PRN Poggi, Marshall Cork, MD      . docusate sodium (COLACE) capsule 100 mg  100 mg Oral BID Corky Mull, MD   100 mg at 02/10/20 0906  . donepezil (ARICEPT) tablet 10 mg  10 mg Oral QHS Ivor Costa, MD   10 mg at 02/09/20 2028  . DULoxetine (  CYMBALTA) DR capsule 40 mg  40 mg Oral Daily Ivor Costa, MD   40 mg at 02/10/20 0852  . enoxaparin (LOVENOX) injection 30 mg  30 mg Subcutaneous Q24H Poggi, Marshall Cork, MD   30 mg at 02/10/20 0848  . feeding supplement (ENSURE ENLIVE) (ENSURE ENLIVE) liquid 237 mL  237 mL Oral BID BM Ivor Costa, MD   237 mL at 02/10/20 0907  . gabapentin (NEURONTIN) capsule 300 mg  300 mg Oral BID Ivor Costa, MD   300 mg at 02/10/20 0907  . [START ON 02/11/2020] hydroxychloroquine (PLAQUENIL) tablet 200 mg  200 mg Oral Cherylynn Ridges, MD      . influenza vaccine adjuvanted (FLUAD) injection 0.5 mL  0.5 mL Intramuscular Tomorrow-1000 Fritzi Mandes, MD      . levothyroxine (SYNTHROID) tablet 112 mcg  112 mcg Oral  Q0600 Ivor Costa, MD   112 mcg at 02/10/20 0526  . magnesium hydroxide (MILK OF MAGNESIA) suspension 30 mL  30 mL Oral Daily PRN Poggi, Marshall Cork, MD      . methocarbamol (ROBAXIN) tablet 500 mg  500 mg Oral Q8H PRN Ivor Costa, MD      . metoCLOPramide (REGLAN) tablet 5-10 mg  5-10 mg Oral Q8H PRN Poggi, Marshall Cork, MD       Or  . metoCLOPramide (REGLAN) injection 5-10 mg  5-10 mg Intravenous Q8H PRN Poggi, Marshall Cork, MD      . mirabegron ER [facility]) tablet 25 mg  25 mg Oral Daily Ivor Costa, MD   25 mg at 02/10/20 0852  . morphine 2 MG/ML injection 1 mg  1 mg Intravenous Q4H PRN Ivor Costa, MD      . multivitamin with minerals tablet 1 tablet  1 tablet Oral Daily Ivor Costa, MD   1 tablet at 02/10/20 0906  . ondansetron (ZOFRAN) tablet 4 mg  4 mg Oral Q6H PRN Poggi, Marshall Cork, MD       Or  . ondansetron (ZOFRAN) injection 4 mg  4 mg Intravenous Q6H PRN Poggi, Marshall Cork, MD      . polyethylene glycol (MIRALAX / GLYCOLAX) packet 17 g  1 packet Oral Daily PRN Ivor Costa, MD      . polyethylene glycol 0.4% and propylene glycol 0.3% (SYSTANE) ophthalmic gel   Both Eyes BID PRN Ivor Costa, MD      . senna-docusate (Senokot-S) tablet 1 tablet  1 tablet Oral QHS PRN Ivor Costa, MD      . sodium phosphate (FLEET) 7-19 GM/118ML enema 1 enema  1 enema Rectal Once PRN Poggi, Marshall Cork, MD      . sulfaSALAzine (AZULFIDINE) EC tablet 500 mg  500 mg Oral BID Ivor Costa, MD   500 mg at 02/10/20 0852  . traMADol (ULTRAM) tablet 50 mg  50 mg Oral Q6H PRN Poggi, Marshall Cork, MD      . vitamin B-12 (CYANOCOBALAMIN) tablet 5,000 mcg  5,000 mcg Oral Daily Ivor Costa, MD   5,000 mcg at 02/10/20 6060     Discharge Medications: Please see discharge summary for a list of discharge medications.  Relevant Imaging Results:  Relevant Lab Results:   Additional Information SS# [id number]  Shelbie Ammons, RN

## 2020-02-10 NOTE — Care Management Important Message (Signed)
Important Message  Patient Details  Name: Emma Kennedy MRN: [id number] Date of Birth: November 24, 1929   Medicare Important Message Given:  Yes     Dannette Barbara 02/10/2020, 11:51 AM

## 2020-02-10 NOTE — Plan of Care (Signed)

## 2020-02-10 NOTE — Progress Notes (Signed)
[place] at [place] NAME: Daana Petrasek    MR#:  [id number]  DATE OF BIRTH:  03-14-1930  SUBJECTIVE:   Came in after having a mechanical fall at home.  Daughter at bedside. Patient on the [facility]. Worked with physical therapy. Sats dropped down to 88. Patient was not in respiratory distress. Currently on 2 L nasal cannula. Saturations are more than 92%.  Denies any pain.   REVIEW OF SYSTEMS:   Review of Systems  Constitutional: Negative for chills, fever and weight loss.  HENT: Negative for ear discharge, ear pain and nosebleeds.   Eyes: Negative for blurred vision, pain and discharge.  Respiratory: Negative for sputum production, shortness of breath, wheezing and stridor.   Cardiovascular: Negative for chest pain, palpitations, orthopnea and PND.  Gastrointestinal: Negative for abdominal pain, diarrhea, nausea and vomiting.  Genitourinary: Negative for frequency and urgency.  Musculoskeletal: Negative for back pain and joint pain.  Neurological: Negative for sensory change, speech change, focal weakness and weakness.  Psychiatric/Behavioral: Negative for depression and hallucinations. The patient is not nervous/anxious.    Tolerating Diet:yes Tolerating PT: recommends rehab  DRUG ALLERGIES:   Allergies  Allergen Reactions  . Hydrocodone-Acetaminophen Shortness Of Breath, Nausea Only and Other (See Comments)    Causes severe dizziness  . Chloride Other (See Comments)    unknown  . Clinoril [Sulindac] Other (See Comments)    Stomach pain  . Etodolac Other (See Comments)    unknown  . Meloxicam Other (See Comments)    unknown  . Methotrexate Derivatives Other (See Comments)    unknown  . Prednisone Swelling    Ok to take low dose for short amount of time.  . Relafen [Nabumetone] Other (See Comments)    unknown  . Trospium Other (See Comments)    unknown    VITALS:  Blood pressure 135/64, pulse 81, temperature 98.4 F (36.9 C),  temperature source Oral, resp. rate 17, height 4\' 11"  (1.499 m), weight 42.2 kg, SpO2 93 %.  PHYSICAL EXAMINATION:   Physical Exam  GENERAL:  86 y.o.-year-old patient lying in the bed with no acute distress.  HEENT: Head atraumatic, normocephalic. Oropharynx and nasopharynx clear.  LUNGS: Normal breath sounds bilaterally, no wheezing, rales, rhonchi. No use of accessory muscles of respiration.  CARDIOVASCULAR: S1, S2 normal. No murmurs, rubs, or gallops.  ABDOMEN: Soft, nontender, nondistended. Bowel sounds present. No organomegaly or mass.  EXTREMITIES: No cyanosis, clubbing or edema b/l.   Surgical site looks okay. NEUROLOGIC: Cranial nerves II through XII are intact. No focal Motor or sensory deficits b/l.   PSYCHIATRIC:  patient is alert and awake.  SKIN: No obvious rash, lesion, or ulcer.   LABORATORY PANEL:  CBC Recent Labs  Lab 02/10/20 0436  WBC 8.1  HGB 9.0*  HCT 26.7*  PLT 165    Chemistries  Recent Labs  Lab 02/07/20 0332 02/07/20 0332 02/09/20 0348  NA 134*   < > 138  K 3.8   < > 3.7  CL 98   < > 104  CO2 28   < > 26  GLUCOSE 101*   < > 111*  BUN 19   < > 19  CREATININE 0.82   < > 0.55  CALCIUM 9.4   < > 8.8*  AST 26  --   --   ALT 18  --   --   ALKPHOS 44  --   --   BILITOT 0.5  --   --    < > =  values in this interval not displayed.   Cardiac Enzymes No results for input(s): TROPONINI in the last 168 hours. RADIOLOGY:  No results found. ASSESSMENT AND PLAN:   AMYRAH PINKHASOV is a 86 y.o. female with medical history significant of hypothyroidism, anemia, arthritis, diverticulosis, headache, dementia, who presents with fall and left hip pain. Per her daughter, it seems the patient fell while using a bedside commode at home.   Closed left hip fracture (HCC) status post mechanical fall at home - CT of her left hip showed intertrochanteric left femur fracture with comminution. Patient seems to have severe pain. No neurovascular compromise. -Orthopedic  surgeon, Dr. Roland Rack was consulted -postop day # 2 S/P Reduction and internal fixation of displaced intertrochanteric left hip fracture with nail. - Pain control: morphine prn and percocet - When necessary Zofran for nausea - Robaxin for muscle spasm -PT/OT recommends rehab. -TOC for discharge planning--working on STR  RA (rheumatoid arthritis) ([place]): -Continue Plaquenil and sulfasalazine  Normocytic anemia: Hemoglobin 11.4 -hemoglobin 9.9 expected postoperatively  Hypothyroidism: -Synthroid  Dementia without behavioral disturbance -Continue donepezil  Syncope and fall: Etiology for her syncope is not clear. - Orthostatic vital signs  stable - MRI-brain chronic small vessel disease. Left parietal scalp injury with stable - EEG cancelled-- daughter aware and agreeable. There is no history of seizures according to daughter. -Patient was seen by neurology Dr. Manuella Ghazi as outpatient. She had EEG done in April 2021. No epileptiform activity was noted. -No seizures or any seizure like activity noted by staff or family while patient has been in the ER and at home. - 2d echo showed Left Ventricle: Left ventricular ejection fraction, by estimation, is 60  to 65%. The left ventricle has normal function.right ventricular  function normal.  Mild mitral valve regurgitation.  Tricuspid valve regurgitation is mild to moderate    DVT ppx: Lovenox Code Status: DNR per her daughter prior to admission Family Communication: Yes, patient's daughter  [name] at bed side Disposition Plan:   likely rehab Consults called:  Dr. Roland Rack Admission status: Med-surg bed as inpt    Status is: Inpatient  Remains inpatient appropriate because:Inpatient level of care appropriate due to severity of illness   Dispo: The patient is from: Home  Anticipated d/c is to: to be determined  Anticipated d/c date is: 2 days  Patient currently is  medically stable to  d/c. Patient is postop day 2 evaluated by physical therapy recommends rehab. TOC to work on rehab placement.    TOTAL TIME TAKING CARE OF THIS PATIENT: *25* minutes.  >50% time spent on counselling and coordination of care  Note: This dictation was prepared with Dragon dictation along with smaller phrase technology. Any transcriptional errors that result from this process are unintentional.  Fritzi Mandes M.D    [facility]   CC: Primary care physician; Adin Hector, MDPatient ID: Lenon Curt, female   DOB: [birth year], 86 y.o.   MRN: [id number]

## 2020-02-10 NOTE — Progress Notes (Signed)
  Subjective: 2 Days Post-Op Procedure(s) (LRB): INTRAMEDULLARY (IM) NAIL INTERTROCHANTRIC (Left) Patient reports pain as mild.   Patient is well, and has had no acute complaints or problems Plan is for d/c to SNF, currently searching for bed. Negative for chest pain and shortness of breath Fever: no Gastrointestinal:Negative for nausea and vomiting  Objective: Vital signs in last 24 hours: Temp:  [97.6 F (36.4 C)-98.4 F (36.9 C)] 98.4 F (36.9 C) (10/12 0808) Pulse Rate:  [72-81] 81 (10/12 0808) Resp:  [16-17] 17 (10/12 0808) BP: (105-135)/(64-89) 135/64 (10/12 0808) SpO2:  [84 %-93 %] 93 % (10/12 0808)  Intake/Output from previous day:  Intake/Output Summary (Last 24 hours) at 02/10/2020 1605 Last data filed at 02/10/2020 1400 Gross per 24 hour  Intake 600 ml  Output --  Net 600 ml    Intake/Output this shift: Total I/O In: 480 [P.O.:480] Out: -   Labs: Recent Labs    02/08/20 0517 02/09/20 0348 02/10/20 0436  HGB 11.6* 9.9* 9.0*   Recent Labs    02/09/20 0348 02/10/20 0436  WBC 10.6* 8.1  RBC 3.21* 2.83*  HCT 30.9* 26.7*  PLT 170 165   Recent Labs    02/09/20 0348  NA 138  K 3.7  CL 104  CO2 26  BUN 19  CREATININE 0.55  GLUCOSE 111*  CALCIUM 8.8*   No results for input(s): LABPT, INR in the last 72 hours.   EXAM General - Patient is Alert and Appropriate Extremity - ABD soft Sensation intact distally Intact pulses distally Dorsiflexion/Plantar flexion intact Incision: dressing C/D/I No cellulitis present  Dressing/Incision - clean, dry, no drainage Motor Function - intact, moving foot and toes well on exam.  Abdomen soft with normal bowel sounds.  Past Medical History:  Diagnosis Date  . Anemia 2005  . Arthritis   . Diverticulosis   . Headache 2013  . Osteoarthritis   . Osteoporosis   . Thyroid disease     Assessment/Plan: 2 Days Post-Op Procedure(s) (LRB): INTRAMEDULLARY (IM) NAIL INTERTROCHANTRIC (Left) Principal  Problem:   Closed left hip fracture (HCC) Active Problems:   RA (rheumatoid arthritis) (HCC)   Normocytic anemia   Hypothyroidism   Fall   Syncope   Dementia without behavioral disturbance (HCC)   Scalp laceration  Estimated body mass index is 18.78 kg/m as calculated from the following:   Height as of this encounter: 4\' 11"  (1.499 m).   Weight as of this encounter: 42.2 kg. Advance diet Up with therapy D/C IV fluids when tolerating po intake.  Labs reviewed this AM. Hg 9.0 Patient has had a BM. Was able to walk 20 feet with PT, O2 sats did drop during therapy, continue to monitor.  Encouraged incentive spirometer. Denies any chest pain or SOB. Wean off of O2 as tolerated. Continue with PT.  Upon discharge from hospital, will need Lovenox 30mg  daily for 14 days. Follow-up with [facility] in 14 days for staple removal and repeat x-rays of the left femur.  DVT Prophylaxis - Lovenox, Foot Pumps and TED hose Weight-Bearing as tolerated to left leg  J. Cameron Proud, PA-C [facility] 02/10/2020, 4:05 PM

## 2020-02-10 NOTE — Progress Notes (Signed)
Physical Therapy Treatment Patient Details Name: Emma Kennedy MRN: [id number] DOB: 07-10-1929 Today's Date: 02/10/2020    History of Present Illness Pt is an 86 y.o. female presenting to hospital 10/9 s/p fall using BSC with LOC about 10 seconds.  Imaging showing intertrochanteric L femur fx with comminution; also B L5 pars defects with grade 2 L5-S1 anterolisthesis.  Pt s/p ORIF of closed displaced intertrochanteric L hip fx 02/08/20.  PMH includes dementia, h/o sacroplasty 07/2018, anemia, thyroid disease, diverticulosis, and h/o bladder and colon surgery.    PT Comments    Pt sitting in recliner upon PT arrival; pt's daughter present.  Tolerated sitting LE ex's fairly well.  CGA to min assist with transfers; and CGA to min to mod assist with ambulation 45 feet with RW (vc's required most of ambulation for stepping technique and walker use to improve gait quality; chair follow).  O2 sats 88% on room air beginning of session at rest; pt's O2 95% or greater on 1 L O2 via nasal cannula with ambulation; and pt's O2 sats 91% on room air at rest end of session.  Will continue to focus on strengthening and progressive functional mobility per pt tolerance.   Follow Up Recommendations  SNF     Equipment Recommendations  Rolling walker with 5" wheels;Wheelchair (measurements PT);Wheelchair cushion (measurements PT)    Recommendations for Other Services OT consult     Precautions / Restrictions Precautions Precautions: Fall Restrictions Weight Bearing Restrictions: Yes LLE Weight Bearing: Weight bearing as tolerated    Mobility  Bed Mobility Overal bed mobility: Needs Assistance Bed Mobility: Sit to Supine     Sit to supine: Min assist;HOB elevated   General bed mobility comments: assist for L LE; 2 assist to boost pt up in bed via bed linen  Transfers Overall transfer level: Needs assistance Equipment used: Rolling walker (2 wheeled) Transfers: Sit to/from Stand Sit to Stand: Min  guard;Min assist (x2 trials from recliner) Stand pivot transfers: Min guard;Min assist (stand step turn recliner to bed with RW)       General transfer comment: x2 trials from recliner  Ambulation/Gait Ambulation/Gait assistance: Min guard;Min assist;Mod assist Gait Distance (Feet): 45 Feet Assistive device: Rolling walker (2 wheeled)   Gait velocity: decreased   General Gait Details: antalgic; decreased stance time L LE; L knee flexing when advancing R LE occasionally requiring increased assist and vc's to increase UE support through RW (to offweight L LE when advancing R LE); vc's for walker use and stepping technique most of ambulation   Stairs             Wheelchair Mobility    Modified Rankin (Stroke Patients Only)       Balance Overall balance assessment: Needs assistance Sitting-balance support: No upper extremity supported;Feet supported Sitting balance-Leahy Scale: Good Sitting balance - Comments: steady sitting reaching within BOS   Standing balance support: Single extremity supported Standing balance-Leahy Scale: Poor Standing balance comment: requiring at least single UE support for static standing balance                            Cognition Arousal/Alertness: Awake/alert Behavior During Therapy: WFL for tasks assessed/performed Overall Cognitive Status: History of cognitive impairments - at baseline                                 General Comments: Required increased  time for cueing and 1 step commands at times.      Exercises Total Joint Exercises Long Arc Quad: AROM;Strengthening;Both;10 reps;Seated General Exercises - Lower Extremity Hip Flexion/Marching: AROM;Strengthening;Both;10 reps;Seated    General Comments General comments (skin integrity, edema, etc.): mild drainage noted most proximal honeycomb dressing L thigh.  Nursing cleared pt for participation in physical therapy.  Pt agreeable to PT session.       Pertinent Vitals/Pain Pain Assessment: Faces Faces Pain Scale: Hurts a little bit (6/10 with activity) Pain Location: L hip/thigh Pain Descriptors / Indicators: Grimacing;Discomfort;Guarding Pain Intervention(s): Limited activity within patient's tolerance;Monitored during session;Premedicated before session;Repositioned  HR WFL during sessions activities.    Home Living                      Prior Function            PT Goals (current goals can now be found in the care plan section) Acute Rehab PT Goals Patient Stated Goal: to improve walking PT Goal Formulation: With patient/family Time For Goal Achievement: 02/23/20 Potential to Achieve Goals: Good Progress towards PT goals: Progressing toward goals    Frequency    BID      PT Plan Current plan remains appropriate    Co-evaluation              AM-PAC PT "6 Clicks" Mobility   Outcome Measure  Help needed turning from your back to your side while in a flat bed without using bedrails?: A Little Help needed moving from lying on your back to sitting on the side of a flat bed without using bedrails?: A Little Help needed moving to and from a bed to a chair (including a wheelchair)?: A Little Help needed standing up from a chair using your arms (e.g., wheelchair or bedside chair)?: A Little Help needed to walk in hospital room?: A Lot Help needed climbing 3-5 steps with a railing? : Total 6 Click Score: 15    End of Session Equipment Utilized During Treatment: Gait belt;Oxygen (1 L O2 via nasal cannula) Activity Tolerance: Patient limited by fatigue Patient left: in bed;with call bell/phone within reach;with bed alarm set;with SCD's reapplied;Other (comment) (B heels floating via pillow support; bed in lowest position; fall mat in place) Nurse Communication: Mobility status;Precautions;Weight bearing status PT Visit Diagnosis: Other abnormalities of gait and mobility (R26.89);Muscle weakness  (generalized) (M62.81);History of falling (Z91.81);Difficulty in walking, not elsewhere classified (R26.2);Pain Pain - Right/Left: Left Pain - part of body: Hip     Time: 2778-2423 PT Time Calculation (min) (ACUTE ONLY): 33 min  Charges:  $Gait Training: 8-22 mins $Therapeutic Activity: 8-22 mins                    Leitha Bleak, PT 02/10/20, 3:08 PM

## 2020-02-10 NOTE — Progress Notes (Signed)
OT Cancellation Note  Patient Details Name: Emma Kennedy MRN: [id number] DOB: 03-09-1930   Cancelled Treatment:    Reason Eval/Treat Not Completed: Patient declined, no reason specified  Pt resting in bed with daughter, [name], present at this time. Pt and dtr report having just finished PT moments earlier and pt states she is fatigued at this time and needs rest. Will f/u as able for OT tx. Thank you.  Gerrianne Scale, [place], OTR/L ascom [phone] 02/10/20, 2:30 PM

## 2020-02-10 NOTE — TOC Initial Note (Signed)
Transition of Care [facility]) - Initial/Assessment Note    Patient Details  Name: Emma Kennedy MRN: [id number] Date of Birth: 14-Oct-1929  Transition of Care [facility]) CM/SW Contact:    Emma Ammons, RN Phone Number: 02/10/2020, 11:18 AM  Clinical Narrative:    RNCM met with patient and daughter at bedside. Patient sitting up in recliner having just finished PT session. Patient reports to feeling overall well but tired. Discussed that therapy is recommending patient go to short term rehab at discharge and both patient and daughter agree with this. Explained process for SNF placement. Patient reports that her first choice would be [facility] but she does not want [facility] practitioner or [facility].  RNCM completed PASSR including faxing additional information, completed FL2 and started bed search.        Expected Discharge Plan: Skilled Nursing Facility Barriers to Discharge: No Barriers Identified   Patient Goals and CMS Choice        Expected Discharge Plan and Services Expected Discharge Plan: [place] Acute Care Choice: Nursing Home Living arrangements for the past 2 months: Single Family Home                                      Prior Living Arrangements/Services Living arrangements for the past 2 months: Single Family Home Lives with:: Self Patient language and need for interpreter reviewed:: Yes Do you feel safe going back to the place where you live?: Yes      Need for Family Participation in Patient Care: Yes (Comment) Care giver support system in place?: Yes (comment)   Criminal Activity/Legal Involvement Pertinent to Current Situation/Hospitalization: No - Comment as needed  Activities of Daily Living Home Assistive Devices/Equipment: Walker (specify type) ADL Screening (condition at time of admission) Patient's cognitive ability adequate to safely complete daily activities?: No Is the patient deaf or have difficulty hearing?: No Does  the patient have difficulty seeing, even when wearing glasses/contacts?: No Does the patient have difficulty concentrating, remembering, or making decisions?: Yes Patient able to express need for assistance with ADLs?: No Does the patient have difficulty dressing or bathing?: Yes Independently performs ADLs?: No Communication: Independent Dressing (OT): Needs assistance Grooming: Needs assistance Feeding: Independent Bathing: Needs assistance Toileting: Needs assistance In/Out Bed: Needs assistance Walks in Home: Needs assistance Does the patient have difficulty walking or climbing stairs?: Yes Weakness of Legs: Left Weakness of Arms/Hands: None  Permission Sought/Granted                  Emotional Assessment Appearance:: Appears stated age     Orientation: : Oriented to Self, Oriented to Place, Oriented to  Time, Oriented to Situation Alcohol / Substance Use: Not Applicable Psych Involvement: No (comment)  Admission diagnosis:  Fall [W19.XXXA] Left hip pain [M25.552] Closed left hip fracture ([place]) [S72.002A] Fall, initial encounter [W19.XXXA] Laceration of scalp, initial encounter [S01.01XA] Patient Active Problem List   Diagnosis Date Noted  . Scalp laceration   . Closed left hip fracture ([place]) 02/07/2020  . Syncope 02/07/2020  . Dementia without behavioral disturbance ([place]) 02/07/2020  . RA (rheumatoid arthritis) ([place])   . Normocytic anemia   . Hypothyroidism   . Fall    PCP:  Adin Hector, MD Pharmacy:   [place], [place] [address] [place] [address]  Phone: [phone] Fax: [phone]     Social Determinants of Health (SDOH) Interventions    Readmission Risk Interventions No flowsheet data found.

## 2020-02-11 DIAGNOSIS — S72002A Fracture of unspecified part of neck of left femur, initial encounter for closed fracture: Secondary | ICD-10-CM

## 2020-02-11 MED ORDER — ACETAMINOPHEN 325 MG PO TABS
650.0000 mg | ORAL_TABLET | Freq: Four times a day (QID) | ORAL | Status: DC | PRN
  Administered 2020-02-11 – 2020-02-12 (×3): 650 mg via ORAL
  Filled 2020-02-11 (×3): qty 2

## 2020-02-11 NOTE — Progress Notes (Signed)
Subjective: 3 Days Post-Op Procedure(s) (LRB): INTRAMEDULLARY (IM) NAIL INTERTROCHANTRIC (Left) Patient reports pain as mild.   Patient is well, and has had no acute complaints or problems Plan is for d/c to SNF, currently searching for bed. Negative for chest pain and shortness of breath Fever: no Gastrointestinal:Negative for nausea and vomiting  Objective: Vital signs in last 24 hours: Temp:  [98.1 F (36.7 C)-98.5 F (36.9 C)] 98.5 F (36.9 C) (10/13 0722) Pulse Rate:  [72-81] 72 (10/13 0722) Resp:  [16-20] 18 (10/13 0722) BP: (113-136)/(64-80) 120/65 (10/13 0722) SpO2:  [87 %-98 %] 98 % (10/13 0722)  Intake/Output from previous day:  Intake/Output Summary (Last 24 hours) at 02/11/2020 0804 Last data filed at 02/10/2020 1400 Gross per 24 hour  Intake 480 ml  Output --  Net 480 ml    Intake/Output this shift: No intake/output data recorded.  Labs: Recent Labs    02/09/20 0348 02/10/20 0436  HGB 9.9* 9.0*   Recent Labs    02/09/20 0348 02/10/20 0436  WBC 10.6* 8.1  RBC 3.21* 2.83*  HCT 30.9* 26.7*  PLT 170 165   Recent Labs    02/09/20 0348  NA 138  K 3.7  CL 104  CO2 26  BUN 19  CREATININE 0.55  GLUCOSE 111*  CALCIUM 8.8*   No results for input(s): LABPT, INR in the last 72 hours.   EXAM General - Patient is Alert and Appropriate Extremity - ABD soft Sensation intact distally Intact pulses distally Dorsiflexion/Plantar flexion intact Incision: dressing C/D/I No cellulitis present  Dressing/Incision - clean, dry, no drainage Motor Function - intact, moving foot and toes well on exam.  Abdomen soft with normal bowel sounds.  Past Medical History:  Diagnosis Date  . Anemia 2005  . Arthritis   . Diverticulosis   . Headache 2013  . Osteoarthritis   . Osteoporosis   . Thyroid disease     Assessment/Plan: 3 Days Post-Op Procedure(s) (LRB): INTRAMEDULLARY (IM) NAIL INTERTROCHANTRIC (Left) Principal Problem:   Closed left hip  fracture (HCC) Active Problems:   RA (rheumatoid arthritis) (HCC)   Normocytic anemia   Hypothyroidism   Fall   Syncope   Dementia without behavioral disturbance (HCC)   Scalp laceration  Estimated body mass index is 18.78 kg/m as calculated from the following:   Height as of this encounter: 4\' 11"  (1.499 m).   Weight as of this encounter: 42.2 kg. Advance diet Up with therapy D/C IV fluids when tolerating po intake.   Patient has had a BM. Patient was able to tolerate more exercises with PT yesterday afternoon, required less supplemental O2. Continue with PT.  Upon discharge from hospital, will need Lovenox 30mg  daily for 14 days. Follow-up with [facility] in 14 days for staple removal and repeat x-rays of the left femur.  DVT Prophylaxis - Lovenox, Foot Pumps and TED hose Weight-Bearing as tolerated to left leg  J. Cameron Proud, PA-C [facility] 02/11/2020, 8:04 AM

## 2020-02-11 NOTE — Progress Notes (Addendum)
[place] [facility] [place] NAME: Emma Kennedy    MR#:  [id number]  DATE OF BIRTH:  Feb 03, 1930  SUBJECTIVE:   Came in after having a mechanical fall at home.  Daughter at bedside. Patient on the [facility]. Worked with physical therapy. Sats dropped down to 88. Patient was not in respiratory distress. Currently on 2 L nasal cannula. Saturations are more than 92%.  Denies any pain. Says feel weak but no specific complaints. Has appetite  REVIEW OF SYSTEMS:   Review of Systems  Constitutional: Negative for chills, fever and weight loss.  HENT: Negative for ear discharge, ear pain and nosebleeds.   Eyes: Negative for blurred vision, pain and discharge.  Respiratory: Negative for sputum production, shortness of breath, wheezing and stridor.   Cardiovascular: Negative for chest pain, palpitations, orthopnea and PND.  Gastrointestinal: Negative for abdominal pain, diarrhea, nausea and vomiting.  Genitourinary: Negative for frequency and urgency.  Musculoskeletal: Negative for back pain and joint pain.  Neurological: Negative for sensory change, speech change, focal weakness and weakness.  Psychiatric/Behavioral: Negative for depression and hallucinations. The patient is not nervous/anxious.    Tolerating Diet:yes Tolerating PT: recommends rehab  DRUG ALLERGIES:   Allergies  Allergen Reactions  . Hydrocodone-Acetaminophen Shortness Of Breath, Nausea Only and Other (See Comments)    Causes severe dizziness  . Chloride Other (See Comments)    unknown  . Clinoril [Sulindac] Other (See Comments)    Stomach pain  . Etodolac Other (See Comments)    unknown  . Meloxicam Other (See Comments)    unknown  . Methotrexate Derivatives Other (See Comments)    unknown  . Prednisone Swelling    Ok to take low dose for short amount of time.  . Relafen [Nabumetone] Other (See Comments)    unknown  . Trospium Other (See Comments)    unknown    VITALS:  Blood  pressure 120/65, pulse 72, temperature 98.5 F (36.9 C), temperature source Oral, resp. rate 18, height 4\' 11"  (1.499 m), weight 42.2 kg, SpO2 98 %.  PHYSICAL EXAMINATION:   Physical Exam  GENERAL:  86 y.o.-year-old patient lying in the bed with no acute distress.  HEENT: Head atraumatic, normocephalic.   LUNGS: Normal breath sounds bilaterally, no wheezing, rales, rhonchi. No use of accessory muscles of respiration.  CARDIOVASCULAR: S1, S2 normal. No murmurs, rubs, or gallops.  ABDOMEN: Soft, nontender, nondistended. Bowel sounds present. No organomegaly or mass.  EXTREMITIES: No cyanosis, clubbing or edema b/l.   Surgical site looks c/d/i. NEUROLOGIC: moves all 4 extremities PSYCHIATRIC:  patient is alert and awake.  SKIN: No obvious rash, lesion, or ulcer.   LABORATORY PANEL:  CBC Recent Labs  Lab 02/10/20 0436  WBC 8.1  HGB 9.0*  HCT 26.7*  PLT 165    Chemistries  Recent Labs  Lab 02/07/20 0332 02/07/20 0332 02/09/20 0348  NA 134*   < > 138  K 3.8   < > 3.7  CL 98   < > 104  CO2 28   < > 26  GLUCOSE 101*   < > 111*  BUN 19   < > 19  CREATININE 0.82   < > 0.55  CALCIUM 9.4   < > 8.8*  AST 26  --   --   ALT 18  --   --   ALKPHOS 44  --   --   BILITOT 0.5  --   --    < > =  values in this interval not displayed.   Cardiac Enzymes No results for input(s): TROPONINI in the last 168 hours. RADIOLOGY:  No results found. ASSESSMENT AND PLAN:   Emma Kennedy is a 86 y.o. female with medical history significant of hypothyroidism, anemia, arthritis, diverticulosis, headache, dementia, who presents with fall and left hip pain. Per her daughter, it seems the patient fell while using a bedside commode at home.   Closed left hip fracture (HCC) status post mechanical fall at home - CT of her left hip showed intertrochanteric left femur fracture with comminution. Patient seems to have severe pain. No neurovascular compromise. -postop day # 3 S/P Reduction and internal  fixation of displaced intertrochanteric left hip fracture with nail. - will need 2 weeks lovenox 30 mg qd and f/u with ortho in 2 weeks, family is aware - Pain control: morphine prn and percocet - When necessary Zofran for nausea - Robaxin for muscle spasm -PT/OT recommends rehab. -TOC for discharge planning--working on STR  Hypoxia - sats 88% on room air, not on o2 at home, suspect degree of atelectasis that developed after surgery secondary to poor inspiratory effort. No dyspnea - cont incentive spirometry and [place] o2, wean as able   RA (rheumatoid arthritis) ([place]): -Continue Plaquenil and sulfasalazine   Acute blood loss anemia: Hemoglobin 11.4 -hemoglobin 9 expected postoperatively, will repeat cbc tomorrow. No report of bleeding/melena.   Hypothyroidism: -Synthroid   Dementia without behavioral disturbance -Continue donepezil   Syncope and fall: Etiology for her syncope is not clear, w/u here negative. - Orthostatic vital signs  stable - MRI-brain chronic small vessel disease. Left parietal scalp injury with stable - EEG cancelled-- daughter aware and agreeable. There is no history of seizures according to daughter. -Patient was seen by neurology Dr. Manuella Ghazi as outpatient. She had EEG done in April 2021. No epileptiform activity was noted. -No seizures or any seizure like activity noted by staff or family while patient has been in the ER and at home. - 2d echo showed Left Ventricle: Left ventricular ejection fraction, by estimation, is 60  to 65%. The left ventricle has normal function.right ventricular  function normal.  Mild mitral valve regurgitation.  Tricuspid valve regurgitation is mild to moderate  Underweight - borderline, bmi ~18.5 - continue ensure supplements       DVT ppx: Lovenox Code Status: DNR per her daughter prior to admission Family Communication: Yes, patient's daughter  [name] at bed side Disposition Plan:   likely rehab Consults called:  Dr.  Roland Rack Admission status: Med-surg bed as inpt      Status is: Inpatient   Remains inpatient appropriate because:Inpatient level of care appropriate due to severity of illness     Dispo: The patient is from: Home              Anticipated d/c is to: to be determined              Anticipated d/c date is: 0-2 days pending snf acceptance              Patient currently is  medically stable to d/c. Patient is postop day 2 evaluated by physical therapy recommends rehab. TOC to work on rehab placement.    TOTAL TIME TAKING CARE OF THIS PATIENT: *25* minutes.  >50% time spent on counselling and coordination of care    Emma Kennedy M.D    [facility]   CC: Primary care physician; Adin Hector, MDPatient ID: Emma Shih  Kennedy, female   DOB: December 11, 1929, 86 y.o.   MRN: [id number]

## 2020-02-11 NOTE — Plan of Care (Signed)
  Problem: Clinical Measurements: Goal: Will remain free from infection Outcome: Progressing   Problem: Activity: Goal: Risk for activity intolerance will decrease Outcome: Progressing   

## 2020-02-11 NOTE — TOC Progression Note (Signed)
Transition of Care [facility]) - Progression Note    Patient Details  Name: Emma Kennedy MRN: [id number] Date of Birth: [birth year]  Transition of Care [facility]) CM/SW [place], RN Phone Number: 02/11/2020, 2:19 PM  Clinical Narrative:  RNCM received insurance authorization from [employer]. Skilled nursing authorization is good for 7 days with auth# [id number] and medical transport # [phone]     Expected Discharge Plan: Skilled Nursing Facility Barriers to Discharge: No Barriers Identified  Expected Discharge Plan and Services Expected Discharge Plan: [place] Choice: Nursing Home Living arrangements for the past 2 months: Single Family Home                                       Social Determinants of Health (SDOH) Interventions    Readmission Risk Interventions No flowsheet data found.

## 2020-02-11 NOTE — Progress Notes (Signed)
Physical Therapy Treatment Patient Details Name: Emma Kennedy MRN: [id number] DOB: 1929-09-29 Today's Date: 02/11/2020    History of Present Illness Pt is an 86 y.o. female presenting to hospital 10/9 s/p fall using BSC with LOC about 10 seconds.  Imaging showing intertrochanteric L femur fx with comminution; also B L5 pars defects with grade 2 L5-S1 anterolisthesis.  Pt s/p ORIF of closed displaced intertrochanteric L hip fx 02/08/20.  PMH includes dementia, h/o sacroplasty 07/2018, anemia, thyroid disease, diverticulosis, and h/o bladder and colon surgery.    PT Comments    Pt resting in recliner upon PT arrival and agreeable to walking.  Able to stand with CGA and then ambulate 45 feet with RW CGA to min assist x1 (limited distance d/t fatigue; chair follow provided).  Pt assisted back to bed end of session to rest.  O2 sats 96% or greater on 1 L O2 via nasal cannula during sessions activities.  Will continue to focus on strengthening and progressive functional mobility per pt tolerance.    Follow Up Recommendations  SNF     Equipment Recommendations  Rolling walker with 5" wheels;Wheelchair (measurements PT);Wheelchair cushion (measurements PT)    Recommendations for Other Services OT consult     Precautions / Restrictions Precautions Precautions: Fall Restrictions Weight Bearing Restrictions: Yes LLE Weight Bearing: Weight bearing as tolerated    Mobility  Bed Mobility Overal bed mobility: Needs Assistance Bed Mobility: Sit to Supine       Sit to supine: Min assist;HOB elevated   General bed mobility comments: assist for L LE sit to semi-supine in bed; 2 assist to boost pt up in bed end of session (using bed sheets)  Transfers Overall transfer level: Needs assistance Equipment used: Rolling walker (2 wheeled) Transfers: Sit to/from Stand Sit to Stand: Min guard         General transfer comment: intermittent vc's and tactile cues for UE placement for  transfers  Ambulation/Gait Ambulation/Gait assistance: Min guard;Min assist Gait Distance (Feet): 45 Feet Assistive device: Rolling walker (2 wheeled)   Gait velocity: decreased   General Gait Details: antalgic; decreased stance time L LE; occasional vc's for walker use/stepping technique and to increase UE support through RW (to offweight L LE when advancing R LE d/t L hip pain)   Stairs             Wheelchair Mobility    Modified Rankin (Stroke Patients Only)       Balance Overall balance assessment: Needs assistance Sitting-balance support: No upper extremity supported;Feet supported Sitting balance-Leahy Scale: Good Sitting balance - Comments: steady sitting reaching within BOS   Standing balance support: No upper extremity supported;During functional activity Standing balance-Leahy Scale: Good Standing balance comment: steady static standing                            Cognition Arousal/Alertness: Awake/alert Behavior During Therapy: WFL for tasks assessed/performed Overall Cognitive Status: History of cognitive impairments - at baseline                                 General Comments: increased time and cueing for 1 step vc's      Exercises      General Comments   Nursing cleared pt for participation in physical therapy.  Pt agreeable to PT session.  Pt's daughter present during session.      Pertinent  Vitals/Pain Pain Assessment: Faces Faces Pain Scale: Hurts a little bit (2/10 at rest; 6/10 with activity) Pain Location: L hip/thigh with mobility Pain Descriptors / Indicators: Grimacing;Discomfort;Guarding Pain Intervention(s): Limited activity within patient's tolerance;Monitored during session;Repositioned  HR WFL during sessions activities.    Home Living                      Prior Function            PT Goals (current goals can now be found in the care plan section) Acute Rehab PT Goals Patient Stated  Goal: to improve walking PT Goal Formulation: With patient/family Time For Goal Achievement: 02/23/20 Potential to Achieve Goals: Good Progress towards PT goals: Progressing toward goals    Frequency    BID      PT Plan Current plan remains appropriate    Co-evaluation              AM-PAC PT "6 Clicks" Mobility   Outcome Measure  Help needed turning from your back to your side while in a flat bed without using bedrails?: A Little Help needed moving from lying on your back to sitting on the side of a flat bed without using bedrails?: A Little Help needed moving to and from a bed to a chair (including a wheelchair)?: A Little Help needed standing up from a chair using your arms (e.g., wheelchair or bedside chair)?: A Little Help needed to walk in hospital room?: A Little Help needed climbing 3-5 steps with a railing? : A Lot 6 Click Score: 17    End of Session Equipment Utilized During Treatment: Gait belt;Oxygen (1 L O2 via nasal cannula) Activity Tolerance: Patient limited by fatigue Patient left: in bed;with call bell/phone within reach;with bed alarm set;with family/visitor present;with SCD's reapplied;Other (comment) (B heels floating via pillow support; bed in lowest position; fall mat in place) Nurse Communication: Mobility status;Precautions;Weight bearing status PT Visit Diagnosis: Other abnormalities of gait and mobility (R26.89);Muscle weakness (generalized) (M62.81);History of falling (Z91.81);Difficulty in walking, not elsewhere classified (R26.2);Pain Pain - Right/Left: Left Pain - part of body: Hip     Time: 1420-1450 PT Time Calculation (min) (ACUTE ONLY): 30 min  Charges:  $Gait Training: 8-22 mins $Therapeutic Activity: 8-22 mins                    Leitha Bleak, PT 02/11/20, 4:55 PM

## 2020-02-11 NOTE — Progress Notes (Signed)
Occupational Therapy Treatment Patient Details Name: Emma Kennedy MRN: [id number] DOB: 01/08/1930 Today's Date: 02/11/2020    History of present illness Pt is an 86 y.o. female presenting to hospital 10/9 s/p fall using BSC with LOC about 10 seconds.  Imaging showing intertrochanteric L femur fx with comminution; also B L5 pars defects with grade 2 L5-S1 anterolisthesis.  Pt s/p ORIF of closed displaced intertrochanteric L hip fx 02/08/20.  PMH includes dementia, h/o sacroplasty 07/2018, anemia, thyroid disease, diverticulosis, and h/o bladder and colon surgery.   OT comments  Ms. Stumpp was seen for OT treatment on this date. Upon arrival to room pt sleeping in room recliner with daughter present at bedside. Pt wakes easily to VCs and agreeable to tx session. Pt/caregiver instructed in LB dressing techniques with and without AE to support safety and functional independence upon hospital DC. Pt/caregiver also instructed on falls prevention strategies and routines modifications to support safety and functional independence upon hospital DC. OT facilitates LB dressing activity as described below. Pt return demonstrates understanding of AE education using the [facility] reacher to doff/don her brief. She is unable to safely safely sequence steps to utilize the sock aid to don hospital socks. Caregiver at bedside indicates background hx of dementia may be limiting carryover and that family are happy to assist with socks at home. Pt, caregiver, and provider discuss strategies for identifying safest and least restrictive options for AE use upon hospital DC. Both verbalize understanding. Pt is making excellent progress toward OT goals and continues to benefit from skilled OT services to maximize return to PLOF and minimize risk of future falls, injury, caregiver burden, and readmission. Will continue to follow POC as written. Discharge recommendation remains appropriate.    Follow Up Recommendations  SNF     Equipment Recommendations  None recommended by OT    Recommendations for Other Services      Precautions / Restrictions Precautions Precautions: Fall Restrictions Weight Bearing Restrictions: Yes LLE Weight Bearing: Weight bearing as tolerated       Mobility Bed Mobility Overal bed mobility: Needs Assistance Bed Mobility: Supine to Sit     Supine to sit: Min assist;HOB elevated     General bed mobility comments: Deferred. Pt in recliner at start/end of session.  Transfers Overall transfer level: Needs assistance Equipment used: Rolling walker (2 wheeled) Transfers: Sit to/from Stand Sit to Stand: Min guard         General transfer comment: Requires cueing for safety during STS transfers.    Balance Overall balance assessment: Needs assistance Sitting-balance support: No upper extremity supported;Feet supported Sitting balance-Leahy Scale: Good Sitting balance - Comments: steady sitting reaching within BOS   Standing balance support: No upper extremity supported;During functional activity Standing balance-Leahy Scale: Good Standing balance comment: Pt is able to maintain static standing balance t/o LB dressing tasks this date. Able to pull briefs up/down w/o over LOB or need for physical assist from therapist.                           ADL either performed or assessed with clinical judgement   ADL Overall ADL's : Needs assistance/impaired                                       General ADL Comments: Pt continues to require SETUP assist for seated ADL management, MOD A  to don socks. She is able to doff socks and brief with supervision for safety, as well as don brief using a [place] reacher given cueing for technique and supervision for safety. She is CGA for all STS transfers during LB dressing session. Daughter present at bedside and participates t/o, also educated on safe use of AE for LB ADL mgt.     Vision Baseline Vision/History:  Wears glasses Wears Glasses: At all times Patient Visual Report: No change from baseline     Perception     Praxis      Cognition Arousal/Alertness: Awake/alert Behavior During Therapy: WFL for tasks assessed/performed Overall Cognitive Status: History of cognitive impairments - at baseline                                 General Comments: Min increased time/cueing for 1 step VCs, however pt does exceptionally well with new use of AE for LB dressing techniques and is able to return demonstrate understanding of education provided.        Exercises Total Joint Exercises Ankle Circles/Pumps: AROM;Strengthening;Both;10 reps;Supine Quad Sets: AROM;Strengthening;Both;10 reps;Supine Gluteal Sets: AROM;Strengthening;Both;10 reps;Supine Towel Squeeze: AROM;Strengthening;Both;10 reps;Supine Heel Slides: AROM;Strengthening;Left;10 reps;Supine Hip ABduction/ADduction: AAROM;Strengthening;Left;10 reps;Supine Straight Leg Raises: AAROM;Strengthening;Left;10 reps;Supine Other Exercises Other Exercises: OT facilitates f/u education on falls prevention strategies, safe use of AE for LB ADL management, compensatory dressing techniques, and routines modifications to support safety and functional independence upon hospital DC. Other Exercises: OT facilitates LB dressing including doffing/donning brief and bilat hospital socks from STS with safety and education provided t/o.   Shoulder Instructions       General Comments Pt on 1L [place] t/o session. Vitals remain stable with O2 sats >96%.    Pertinent Vitals/ Pain       Pain Assessment: Faces Faces Pain Scale: Hurts even more Pain Location: L hip/thigh with mobility Pain Descriptors / Indicators: Grimacing;Discomfort;Guarding Pain Intervention(s): Limited activity within patient's tolerance;Monitored during session;Repositioned;Premedicated before session  Home Living                                           Prior Functioning/Environment              Frequency  Min 2X/week        Progress Toward Goals  OT Goals(current goals can now be found in the care plan section)  Progress towards OT goals: Progressing toward goals  Acute Rehab OT Goals Patient Stated Goal: to improve walking OT Goal Formulation: With patient Time For Goal Achievement: 02/23/20 Potential to Achieve Goals: Good  Plan Discharge plan remains appropriate;Frequency remains appropriate    Co-evaluation                 AM-PAC OT "6 Clicks" Daily Activity     Outcome Measure   Help from another person eating meals?: A Little Help from another person taking care of personal grooming?: A Little Help from another person toileting, which includes using toliet, bedpan, or urinal?: A Little Help from another person bathing (including washing, rinsing, drying)?: A Lot Help from another person to put on and taking off regular upper body clothing?: A Little Help from another person to put on and taking off regular lower body clothing?: A Lot 6 Click Score: 16    End of Session Equipment Utilized During Treatment:  Gait belt;Rolling walker;Oxygen  OT Visit Diagnosis: Unsteadiness on feet (R26.81);Other abnormalities of gait and mobility (R26.89);Muscle weakness (generalized) (M62.81);History of falling (Z91.81)   Activity Tolerance Patient tolerated treatment well   Patient Left with call bell/phone within reach;in chair;with chair alarm set;with family/visitor present;with SCD's reapplied   Nurse Communication          Time: 0175-1025 OT Time Calculation (min): 51 min  Charges: OT General Charges $OT Visit: 1 Visit OT Treatments $Self Care/Home Management : 8-22 mins $Therapeutic Activity: 23-37 mins  Shara Blazing, M.S., OTR/L Ascom: [phone] 02/11/20, 12:43 PM

## 2020-02-11 NOTE — Progress Notes (Signed)
Physical Therapy Treatment Patient Details Name: Emma Kennedy MRN: [id number] DOB: 02-02-1930 Today's Date: 02/11/2020    History of Present Illness Pt is an 86 y.o. female presenting to hospital 10/9 s/p fall using BSC with LOC about 10 seconds.  Imaging showing intertrochanteric L femur fx with comminution; also B L5 pars defects with grade 2 L5-S1 anterolisthesis.  Pt s/p ORIF of closed displaced intertrochanteric L hip fx 02/08/20.  PMH includes dementia, h/o sacroplasty 07/2018, anemia, thyroid disease, diverticulosis, and h/o bladder and colon surgery.    PT Comments    Pt resting in bed upon PT arrival; daughter present.  Tolerated LE ex's in bed fairly well.  Min assist semi-supine to sitting edge of bed; CGA to min assist with transfers; and CGA to min assist ambulating 45 feet with RW (limited distance ambulating d/t fatigue; chair follow provided).  Vc's required for gait technique and walker use during ambulation.  Pt's O2 sats 95% or greater on 1 L O2 via nasal cannula during sessions activities.  Will continue to focus on strengthening and progressive functional mobility during hospitalization.    Follow Up Recommendations  SNF     Equipment Recommendations  Rolling walker with 5" wheels;Wheelchair (measurements PT);Wheelchair cushion (measurements PT)    Recommendations for Other Services OT consult     Precautions / Restrictions Precautions Precautions: Fall Restrictions Weight Bearing Restrictions: Yes LLE Weight Bearing: Weight bearing as tolerated    Mobility  Bed Mobility Overal bed mobility: Needs Assistance Bed Mobility: Supine to Sit     Supine to sit: Min assist;HOB elevated     General bed mobility comments: minimal assist for L LE and trunk  Transfers Overall transfer level: Needs assistance Equipment used: Rolling walker (2 wheeled) Transfers: Sit to/from Stand Sit to Stand: Min guard;Min assist (x1 trial from bed)         General transfer  comment: occasional vc's for UE placement and overall technique  Ambulation/Gait Ambulation/Gait assistance: Min guard;Min assist Gait Distance (Feet): 35 Feet Assistive device: Rolling walker (2 wheeled)   Gait velocity: decreased   General Gait Details: antalgic; decreased stance time L LE; occasional vc's for walker use/stepping technique and to increase UE support through RW (to offweight L LE when advancing R LE d/t L hip pain)   Stairs             Wheelchair Mobility    Modified Rankin (Stroke Patients Only)       Balance Overall balance assessment: Needs assistance Sitting-balance support: No upper extremity supported;Feet supported Sitting balance-Leahy Scale: Good Sitting balance - Comments: steady sitting reaching within BOS   Standing balance support: No upper extremity supported Standing balance-Leahy Scale: Fair Standing balance comment: steady static standing briefly                            Cognition Arousal/Alertness: Awake/alert Behavior During Therapy: WFL for tasks assessed/performed Overall Cognitive Status: History of cognitive impairments - at baseline                                 General Comments: Required increased time for cueing and 1 step commands at times.      Exercises Total Joint Exercises Ankle Circles/Pumps: AROM;Strengthening;Both;10 reps;Supine Quad Sets: AROM;Strengthening;Both;10 reps;Supine Gluteal Sets: AROM;Strengthening;Both;10 reps;Supine Towel Squeeze: AROM;Strengthening;Both;10 reps;Supine Heel Slides: AROM;Strengthening;Left;10 reps;Supine Hip ABduction/ADduction: AAROM;Strengthening;Left;10 reps;Supine Straight Leg Raises: AAROM;Strengthening;Left;10 reps;Supine  General Comments General comments (skin integrity, edema, etc.): mild drainage most proximal honeycomb dressing L thigh.      Pertinent Vitals/Pain Pain Assessment: Faces Faces Pain Scale: Hurts a little bit (6/10 with  activity; 2/10 at rest) Pain Location: L hip/thigh Pain Descriptors / Indicators: Grimacing;Discomfort;Guarding Pain Intervention(s): Limited activity within patient's tolerance;Monitored during session;Premedicated before session;Repositioned  HR WFL during sessions activities.    Home Living                      Prior Function            PT Goals (current goals can now be found in the care plan section) Acute Rehab PT Goals Patient Stated Goal: to improve walking PT Goal Formulation: With patient/family Time For Goal Achievement: 02/23/20 Potential to Achieve Goals: Good Progress towards PT goals: Progressing toward goals    Frequency    BID      PT Plan Current plan remains appropriate    Co-evaluation              AM-PAC PT "6 Clicks" Mobility   Outcome Measure  Help needed turning from your back to your side while in a flat bed without using bedrails?: A Little Help needed moving from lying on your back to sitting on the side of a flat bed without using bedrails?: A Little Help needed moving to and from a bed to a chair (including a wheelchair)?: A Little Help needed standing up from a chair using your arms (e.g., wheelchair or bedside chair)?: A Little Help needed to walk in hospital room?: A Little Help needed climbing 3-5 steps with a railing? : A Lot 6 Click Score: 17    End of Session Equipment Utilized During Treatment: Gait belt;Oxygen (1 L O2 via nasal cannula) Activity Tolerance: Patient limited by fatigue Patient left: in chair;with call bell/phone within reach;with chair alarm set;with family/visitor present;with SCD's reapplied;Other (comment) (B heels floating via pillow support; fall mat in place) Nurse Communication: Mobility status;Precautions;Weight bearing status PT Visit Diagnosis: Other abnormalities of gait and mobility (R26.89);Muscle weakness (generalized) (M62.81);History of falling (Z91.81);Difficulty in walking, not  elsewhere classified (R26.2);Pain Pain - Right/Left: Left Pain - part of body: Hip     Time: 2426-8341 PT Time Calculation (min) (ACUTE ONLY): 38 min  Charges:  $Gait Training: 8-22 mins $Therapeutic Exercise: 8-22 mins $Therapeutic Activity: 8-22 mins                    Leitha Bleak, PT 02/11/20, 12:03 PM

## 2020-02-12 DIAGNOSIS — S72002A Fracture of unspecified part of neck of left femur, initial encounter for closed fracture: Secondary | ICD-10-CM | POA: Diagnosis not present

## 2020-02-12 DIAGNOSIS — R636 Underweight: Secondary | ICD-10-CM | POA: Diagnosis not present

## 2020-02-12 DIAGNOSIS — R55 Syncope and collapse: Secondary | ICD-10-CM | POA: Diagnosis not present

## 2020-02-12 DIAGNOSIS — S7292XA Unspecified fracture of left femur, initial encounter for closed fracture: Secondary | ICD-10-CM | POA: Diagnosis not present

## 2020-02-12 DIAGNOSIS — S72142D Displaced intertrochanteric fracture of left femur, subsequent encounter for closed fracture with routine healing: Secondary | ICD-10-CM | POA: Diagnosis not present

## 2020-02-12 DIAGNOSIS — S72002D Fracture of unspecified part of neck of left femur, subsequent encounter for closed fracture with routine healing: Secondary | ICD-10-CM | POA: Diagnosis not present

## 2020-02-12 DIAGNOSIS — F0280 Dementia in other diseases classified elsewhere without behavioral disturbance: Secondary | ICD-10-CM | POA: Diagnosis not present

## 2020-02-12 DIAGNOSIS — Z8781 Personal history of (healed) traumatic fracture: Secondary | ICD-10-CM | POA: Diagnosis not present

## 2020-02-12 DIAGNOSIS — S0101XA Laceration without foreign body of scalp, initial encounter: Secondary | ICD-10-CM | POA: Diagnosis not present

## 2020-02-12 DIAGNOSIS — Z9889 Other specified postprocedural states: Secondary | ICD-10-CM | POA: Diagnosis not present

## 2020-02-12 DIAGNOSIS — R1312 Dysphagia, oropharyngeal phase: Secondary | ICD-10-CM | POA: Diagnosis not present

## 2020-02-12 DIAGNOSIS — R52 Pain, unspecified: Secondary | ICD-10-CM | POA: Diagnosis not present

## 2020-02-12 DIAGNOSIS — R488 Other symbolic dysfunctions: Secondary | ICD-10-CM | POA: Diagnosis not present

## 2020-02-12 DIAGNOSIS — D6489 Other specified anemias: Secondary | ICD-10-CM | POA: Diagnosis not present

## 2020-02-12 DIAGNOSIS — M1388 Other specified arthritis, other site: Secondary | ICD-10-CM | POA: Diagnosis not present

## 2020-02-12 DIAGNOSIS — R5381 Other malaise: Secondary | ICD-10-CM | POA: Diagnosis not present

## 2020-02-12 DIAGNOSIS — R2689 Other abnormalities of gait and mobility: Secondary | ICD-10-CM | POA: Diagnosis not present

## 2020-02-12 DIAGNOSIS — E038 Other specified hypothyroidism: Secondary | ICD-10-CM | POA: Diagnosis not present

## 2020-02-12 DIAGNOSIS — M0689 Other specified rheumatoid arthritis, multiple sites: Secondary | ICD-10-CM | POA: Diagnosis not present

## 2020-02-12 DIAGNOSIS — M6281 Muscle weakness (generalized): Secondary | ICD-10-CM | POA: Diagnosis not present

## 2020-02-12 DIAGNOSIS — R279 Unspecified lack of coordination: Secondary | ICD-10-CM | POA: Diagnosis not present

## 2020-02-12 DIAGNOSIS — Z7189 Other specified counseling: Secondary | ICD-10-CM | POA: Diagnosis not present

## 2020-02-12 DIAGNOSIS — K5909 Other constipation: Secondary | ICD-10-CM | POA: Diagnosis not present

## 2020-02-12 LAB — CBC
HCT: 24.8 % — ABNORMAL LOW (ref 36.0–46.0)
Hemoglobin: 8.2 g/dL — ABNORMAL LOW (ref 12.0–15.0)
MCH: 31.3 pg (ref 26.0–34.0)
MCHC: 33.1 g/dL (ref 30.0–36.0)
MCV: 94.7 fL (ref 80.0–100.0)
Platelets: 199 10*3/uL (ref 150–400)
RBC: 2.62 MIL/uL — ABNORMAL LOW (ref 3.87–5.11)
RDW: 12.4 % (ref 11.5–15.5)
WBC: 6.7 10*3/uL (ref 4.0–10.5)
nRBC: 0 % (ref 0.0–0.2)

## 2020-02-12 MED ORDER — ENSURE ENLIVE PO LIQD: 237.0000 mL | Freq: Two times a day (BID) | ORAL | 12 refills | Status: AC

## 2020-02-12 MED ORDER — FERROUS SULFATE 325 (65 FE) MG PO TBEC: 325.0000 mg | DELAYED_RELEASE_TABLET | Freq: Two times a day (BID) | ORAL | 3 refills | Status: AC

## 2020-02-12 MED ORDER — SENNOSIDES-DOCUSATE SODIUM 8.6-50 MG PO TABS: 1.0000 | ORAL_TABLET | Freq: Every evening | ORAL | 2 refills | Status: AC | PRN

## 2020-02-12 NOTE — Progress Notes (Signed)
Physical Therapy Treatment Patient Details Name: Emma Kennedy MRN: [id number] DOB: 1929-07-11 Today's Date: 02/12/2020    History of Present Illness Pt is an 86 y.o. female presenting to hospital 10/9 s/p fall using BSC with LOC about 10 seconds.  Imaging showing intertrochanteric L femur fx with comminution; also B L5 pars defects with grade 2 L5-S1 anterolisthesis.  Pt s/p ORIF of closed displaced intertrochanteric L hip fx 02/08/20.  PMH includes dementia, h/o sacroplasty 07/2018, anemia, thyroid disease, diverticulosis, and h/o bladder and colon surgery.    PT Comments    Upon PT arrival, pt appearing uncomfortable in bed and pt's daughter attempting to reposition pt to improve comfort.  Pt reporting 8/10 L hip/thigh pain (pt received pain medication earlier in the morning and declining anything else at that time).  Pt repositioned to attempt to improve comfort and pt agreeable to bed level ex's (pt reporting not feeling up to getting OOB d/t pain).  Pt tolerated B LE ex's fairly well (therapist assisted with L LE ex's to improve comfort) and pt reporting improved L LE comfort with ex's and repositioning in bed end of session (rating L hip/thigh pain to 7/10).  Nurse notified of pt's current pain status.  Will continue to focus on strengthening and progressive functional mobility per pt tolerance.   Follow Up Recommendations  SNF     Equipment Recommendations  Rolling walker with 5" wheels;Wheelchair (measurements PT);Wheelchair cushion (measurements PT)    Recommendations for Other Services OT consult     Precautions / Restrictions Precautions Precautions: Fall Restrictions Weight Bearing Restrictions: Yes LLE Weight Bearing: Weight bearing as tolerated    Mobility  Bed Mobility                  Transfers                    Ambulation/Gait                 Stairs             Wheelchair Mobility    Modified Rankin (Stroke Patients Only)        Balance                                            Cognition Arousal/Alertness: Awake/alert Behavior During Therapy: WFL for tasks assessed/performed Overall Cognitive Status: History of cognitive impairments - at baseline                                 General Comments: increased time and cueing for 1 step vc's      Exercises Total Joint Exercises Ankle Circles/Pumps: AROM;Strengthening;Both;10 reps;Supine Quad Sets: AROM;Strengthening;Both;10 reps;Supine Short Arc Quad: AAROM;Left;AROM;Right;Strengthening;10 reps;Supine Heel Slides: AAROM;Left;AROM;Right;Strengthening;10 reps;Supine Hip ABduction/ADduction: AAROM;Left;AROM;Right;Strengthening;10 reps;Supine    General Comments General comments (skin integrity, edema, etc.): minimal drainage noted proximal L thigh dressing.  Nursing cleared pt for participation in physical therapy.  Pt agreeable to PT session.  Pt's daughter present during session      Pertinent Vitals/Pain Pain Assessment: 0-10 Pain Score: 7  Pain Location: L hip/thigh Pain Descriptors / Indicators: Grimacing;Discomfort;Guarding;Constant Pain Intervention(s): Limited activity within patient's tolerance;Monitored during session;Repositioned;Premedicated before session (RN notified of pt's pain level)  O2 sats 91% or greater on 1 L O2 via nasal cannula during sessions  activities. HR WFL during sessions activities.    Home Living                      Prior Function            PT Goals (current goals can now be found in the care plan section) Acute Rehab PT Goals Patient Stated Goal: to improve walking PT Goal Formulation: With patient/family Time For Goal Achievement: 02/23/20 Potential to Achieve Goals: Good Progress towards PT goals: Progressing toward goals    Frequency    BID      PT Plan Current plan remains appropriate    Co-evaluation              AM-PAC PT "6 Clicks" Mobility    Outcome Measure  Help needed turning from your back to your side while in a flat bed without using bedrails?: A Little Help needed moving from lying on your back to sitting on the side of a flat bed without using bedrails?: A Little Help needed moving to and from a bed to a chair (including a wheelchair)?: A Little Help needed standing up from a chair using your arms (e.g., wheelchair or bedside chair)?: A Little Help needed to walk in hospital room?: A Little Help needed climbing 3-5 steps with a railing? : A Lot 6 Click Score: 17    End of Session Equipment Utilized During Treatment: Gait belt;Oxygen (1 L O2 via nasal cannula) Activity Tolerance: Patient limited by fatigue;Patient limited by pain Patient left: in bed;with call bell/phone within reach;with bed alarm set;with family/visitor present;with SCD's reapplied Nurse Communication: Mobility status;Precautions;Weight bearing status;Other (comment) (pt's pain status and needing ice for ice pack (for L hip)) PT Visit Diagnosis: Other abnormalities of gait and mobility (R26.89);Muscle weakness (generalized) (M62.81);History of falling (Z91.81);Difficulty in walking, not elsewhere classified (R26.2);Pain Pain - Right/Left: Left Pain - part of body: Hip     Time: 8280-0349 PT Time Calculation (min) (ACUTE ONLY): 31 min  Charges:  $Therapeutic Exercise: 23-37 mins                    Leitha Bleak, PT 02/12/20, 3:21 PM

## 2020-02-12 NOTE — Plan of Care (Signed)
  Problem: Education: Goal: Knowledge of General Education information will improve Description: Including pain rating scale, medication(s)/side effects and non-pharmacologic comfort measures Outcome: Progressing   Problem: Health Behavior/Discharge Planning: Goal: Ability to manage health-related needs will improve Outcome: Progressing   Problem: Clinical Measurements: Goal: Ability to maintain clinical measurements within normal limits will improve Outcome: Progressing Goal: Will remain free from infection Outcome: Progressing Goal: Diagnostic test results will improve Outcome: Progressing Goal: Respiratory complications will improve Outcome: Progressing Goal: Cardiovascular complication will be avoided Outcome: Progressing   Problem: Clinical Measurements: Goal: Will remain free from infection Outcome: Progressing   Problem: Clinical Measurements: Goal: Respiratory complications will improve Outcome: Progressing   Problem: Nutrition: Goal: Adequate nutrition will be maintained Outcome: Progressing   Problem: Coping: Goal: Level of anxiety will decrease Outcome: Progressing   Problem: Elimination: Goal: Will not experience complications related to bowel motility Outcome: Progressing Goal: Will not experience complications related to urinary retention Outcome: Progressing   Problem: Pain Managment: Goal: General experience of comfort will improve Outcome: Progressing   Problem: Safety: Goal: Ability to remain free from injury will improve Outcome: Progressing

## 2020-02-12 NOTE — Plan of Care (Signed)
Report given to [facility] on pt - Pt for EMS transport to facility

## 2020-02-12 NOTE — Discharge Summary (Signed)
Emma Kennedy:[id number] DOB: Sep 16, 1929 DOA: 02/07/2020  PCP: Adin Hector, MD  Admit date: 02/07/2020 Discharge date: 02/12/2020  Time spent: 35 minutes  Recommendations for Outpatient Follow-up:  1. Will need hemoglobin checked with 3 days to ensure stability 2. Will need lovenox daily for 2 weeks 3. Will need orthopedics follow-up with Dr. Roland Rack in 2 weeks    Discharge Diagnoses:  Principal Problem:   Closed left hip fracture ([place]) Active Problems:   RA (rheumatoid arthritis) (HCC)   Normocytic anemia   Hypothyroidism   Fall   Syncope   Dementia without behavioral disturbance ([place])   Scalp laceration   Discharge Condition: fair  Diet recommendation: regular  Filed Weights   02/07/20 0333  Weight: 42.2 kg    History of present illness:  Emma Kennedy a 86 y.o.femalewith medical history significant ofhypothyroidism, anemia, arthritis, diverticulosis, headache, dementia, who presents with fall and left hip pain. Per her daughter, itseems the patient fell while using a bedside commode at home.   Hospital Course:  Closed left hip fracture (HCC) status post mechanical fall at home -CT of her left hip showed intertrochanteric left femur fracture with comminution.Patientseems to have severe pain.No neurovascular compromise. - on day of discharge post-op day 4 S/P Reduction and internal fixation of displaced intertrochanteric lefthip fracture with nail. - will need 2 weeks lovenox 30 mg qd and f/u with ortho in 2 weeks, family is aware - Pain control: tramadol prn  Hypoxia - sats 88% on room air, not on o2 at home, suspect degree of atelectasis that developed after surgery secondary to poor inspiratory effort. No dyspnea - continued incentive spirometry and [place] o2  RA (rheumatoid arthritis) ([place]): -Continued Plaquenil and sulfasalazine  Acute blood loss anemia: Hemoglobin 11.4, trend to 8.2 day of discharge - no report of melena/hematochezia or  other bleeding, no BM for past 2 days. - hold aspirin on discharge (appears is for primary prevention) - will need repeat hgb in a few days to ensure stability - start oral iron supplement  Hypothyroidism: -Synthroid continued  Dementia without behavioral disturbance -Continued donepezil  Syncope and fall:Etiologyfor her syncopeis not clear, w/u here negative. - Orthostatic vital signsnormal - MRI-brain chronic small vessel disease. Left parietal scalp injury with stable - EEG cancelled-- daughter aware and agreeable. There is no history of seizures according to daughter. Patient was seen by neurology Dr. Manuella Ghazi as outpatient. She had EEG done in April 2021. No epileptiform activity was noted. No seizures or any seizure like activity noted by staff or family while patient has been in the ER and at home. - 2d echo showed Left Ventricle: Left ventricular ejection fraction, by estimation, is 60  to 65%. The left ventricle has normal function.right ventricular  function normal.  Mild mitral valve regurgitation.  Tricuspid valve regurgitation is mild to moderate  Underweight - borderline, bmi ~18.5 - continued ensure supplements  Procedures:  Reduction and internal fixation of displaced intertrochanteric left hip fractture  Consultations:  orthopedics  Discharge Exam: Vitals:   02/11/20 2326 02/12/20 0729  BP: (!) 147/73 132/72  Pulse: 77 77  Resp: 18 17  Temp: 98.2 F (36.8 C) 98.4 F (36.9 C)  SpO2: 98% 96%    GENERAL:  86 y.o.-year-old patient lying in the bed with no acute distress.  HEENT: Head atraumatic, normocephalic.   LUNGS: Normal breath sounds bilaterally, no wheezing, rales, rhonchi. No use of accessory muscles of respiration.  CARDIOVASCULAR: S1, S2 normal. No murmurs, rubs,  or gallops.  ABDOMEN: Soft, nontender, nondistended. Bowel sounds present. No organomegaly or mass.  EXTREMITIES: No cyanosis, clubbing or edema b/l.   Surgical site looks  c/d/i. NEUROLOGIC: moves all 4 extremities PSYCHIATRIC:  patient is alert and awake.  SKIN: No obvious rash, lesion, or ulcer.   Discharge Instructions   Discharge Instructions    Call MD for:  difficulty breathing, headache or visual disturbances   Complete by: As directed    Call MD for:  persistant dizziness or light-headedness   Complete by: As directed    Call MD for:  persistant nausea and vomiting   Complete by: As directed    Call MD for:  redness, tenderness, or signs of infection (pain, swelling, redness, odor or green/yellow discharge around incision site)   Complete by: As directed    Call MD for:  severe uncontrolled pain   Complete by: As directed    Call MD for:  temperature >100.4   Complete by: As directed    Diet - low sodium heart healthy   Complete by: As directed    Increase activity slowly   Complete by: As directed    Leave dressing on - Keep it clean, dry, and intact until clinic visit   Complete by: As directed      Allergies as of 02/12/2020      Reactions   Hydrocodone-acetaminophen Shortness Of Breath, Nausea Only, Other (See Comments)   Causes severe dizziness   Chloride Other (See Comments)   unknown   Clinoril [sulindac] Other (See Comments)   Stomach pain   Etodolac Other (See Comments)   unknown   Meloxicam Other (See Comments)   unknown   Methotrexate Derivatives Other (See Comments)   unknown   Prednisone Swelling   Ok to take low dose for short amount of time.   Relafen [nabumetone] Other (See Comments)   unknown   Trospium Other (See Comments)   unknown      Medication List    STOP taking these medications   aspirin 81 MG tablet     TAKE these medications   acetaminophen 325 MG tablet Commonly known as: TYLENOL Take 2 tablets (650 mg total) by mouth every 6 (six) hours as needed.   alendronate 70 MG tablet Commonly known as: FOSAMAX Take 70 mg by mouth once a week.   diclofenac sodium 1 % Gel Commonly known as:  VOLTAREN Apply 2 g topically 4 (four) times daily.   donepezil 10 MG tablet Commonly known as: ARICEPT Take 10 mg by mouth at bedtime.   DULoxetine 20 MG capsule Commonly known as: CYMBALTA Take 40 mg by mouth daily.   enoxaparin 30 MG/0.3ML injection Commonly known as: LOVENOX Inject 0.3 mLs (30 mg total) into the skin daily.   feeding supplement Liqd Take 237 mLs by mouth 2 (two) times daily between meals.   ferrous sulfate 325 (65 FE) MG EC tablet Take 1 tablet (325 mg total) by mouth 2 (two) times daily.   gabapentin 300 MG capsule Commonly known as: NEURONTIN Take 300 mg by mouth 2 (two) times daily.   hydroxychloroquine 200 MG tablet Commonly known as: PLAQUENIL Take 200 mg by mouth daily.   levothyroxine 112 MCG tablet Commonly known as: SYNTHROID Take 112 mcg by mouth daily before breakfast.   Myrbetriq 25 MG Tb24 tablet Generic drug: mirabegron ER Take 25 mg by mouth daily.   polyethylene glycol powder 17 GM/SCOOP powder Commonly known as: GLYCOLAX/MIRALAX Take 1 Container by mouth  daily as needed for moderate constipation.   senna-docusate 8.6-50 MG tablet Commonly known as: Senokot-S Take 1 tablet by mouth at bedtime as needed for mild constipation.   sulfaSALAzine 500 MG tablet Commonly known as: AZULFIDINE Take 500 mg by mouth 2 (two) times daily.   SYSTANE OP Place 1 drop into both eyes 2 (two) times daily as needed (dry eyes).   traMADol 50 MG tablet Commonly known as: ULTRAM Take 1-2 tablets (50-100 mg total) by mouth every 6 (six) hours as needed for moderate pain.   VITAMIN B-12 PO Take 5,000 mcg by mouth daily.   Vitamin D3 25 MCG (1000 UT) Caps Take 1,000 Units by mouth daily.            Discharge Care Instructions  (From admission, onward)         Start     Ordered   02/12/20 0000  Leave dressing on - Keep it clean, dry, and intact until clinic visit        02/12/20 1357         Allergies  Allergen Reactions  .  Hydrocodone-Acetaminophen Shortness Of Breath, Nausea Only and Other (See Comments)    Causes severe dizziness  . Chloride Other (See Comments)    unknown  . Clinoril [Sulindac] Other (See Comments)    Stomach pain  . Etodolac Other (See Comments)    unknown  . Meloxicam Other (See Comments)    unknown  . Methotrexate Derivatives Other (See Comments)    unknown  . Prednisone Swelling    Ok to take low dose for short amount of time.  . Relafen [Nabumetone] Other (See Comments)    unknown  . Trospium Other (See Comments)    unknown    Contact information for after-discharge care    Destination    [facility] .   Service: Skilled Nursing Contact information: [address] [phone]                   The results of significant diagnostics from this hospitalization (including imaging, microbiology, ancillary and laboratory) are listed below for reference.    Significant Diagnostic Studies: DG Chest 1 View  Result Date: 02/07/2020 CLINICAL DATA:  Initial evaluation for acute trauma, fall. EXAM: CHEST  1 VIEW COMPARISON:  Prior radiograph from 05/01/2016. FINDINGS: Patient is rotated to the right. Allowing for rotation, mediastinal silhouette within normal limits. Aortic atherosclerosis. Cardiomegaly grossly stable from previous. Lungs mildly hypoinflated. Diffuse prominence of the pulmonary vasculature and interstitial markings, favored to reflect pulmonary interstitial congestion/edema. Sequelae of atypical infection could also have this appearance. No frank airspace consolidation. No pleural effusion. No pneumothorax. No acute osseous finding. Prominent degenerative changes noted about the shoulders. IMPRESSION: 1. Cardiomegaly with diffuse prominence of the pulmonary vasculature and interstitial markings, favored to reflect pulmonary interstitial congestion/edema. Sequelae of atypical infection could also have this  appearance. 2.  Aortic Atherosclerosis (ICD10-I70.0). Electronically Signed   By: Jeannine Boga M.D.   On: 02/07/2020 04:01   CT Head Wo Contrast  Result Date: 02/07/2020 CLINICAL DATA:  Initial evaluation for acute trauma, fall. EXAM: CT HEAD WITHOUT CONTRAST TECHNIQUE: Contiguous axial images were obtained from the base of the skull through the vertex without intravenous contrast. COMPARISON:  Prior CT from 07/07/2019. FINDINGS: Brain: Age-related cerebral atrophy with chronic microvascular ischemic disease. No acute intracranial hemorrhage. No acute large vessel territory infarct. No mass lesion, midline shift or mass effect.  No hydrocephalus or extra-axial fluid collection. Vascular: No hyperdense vessel. Calcified atherosclerosis present at skull base. Skull: Left parietal scalp contusion/laceration with skin staple in place. No calvarial fracture. Sinuses/Orbits: Globes orbital soft tissues within normal limits. Paranasal sinuses and mastoid air cells are clear. Other: None. IMPRESSION: 1. No acute intracranial abnormality. 2. Left parietal scalp contusion/laceration with skin staple in place. No calvarial fracture. 3. Age-related cerebral atrophy with chronic microvascular ischemic disease. Electronically Signed   By: Jeannine Boga M.D.   On: 02/07/2020 04:45   CT Cervical Spine Wo Contrast  Result Date: 02/07/2020 CLINICAL DATA:  Initial evaluation for acute trauma, fall. EXAM: CT CERVICAL SPINE WITHOUT CONTRAST TECHNIQUE: Multidetector CT imaging of the cervical spine was performed without intravenous contrast. Multiplanar CT image reconstructions were also generated. COMPARISON:  None. FINDINGS: Alignment: Vertebral bodies normally aligned with preservation of the normal cervical lordosis. No listhesis or malalignment. Skull base and vertebrae: Skull base intact. Normal C1-2 articulations are preserved in the dens is intact. Vertebral body heights maintained. No acute fracture. Soft  tissues and spinal canal: Soft tissues of the neck demonstrate no acute finding. No abnormal prevertebral edema. Spinal canal within normal limits. Disc levels: Prominent degenerative changes noted about the C1-2 articulation. Mild for age multilevel cervical spondylosis without high-grade spinal stenosis. Upper chest: Visualized upper chest demonstrates no acute finding. Irregular pleuroparenchymal thickening noted about the lung apices bilaterally. Other: None. IMPRESSION: No acute traumatic injury within the cervical spine. Electronically Signed   By: Jeannine Boga M.D.   On: 02/07/2020 05:03   MR BRAIN WO CONTRAST  Result Date: 02/07/2020 CLINICAL DATA:  Syncopal episode with fall trauma to the head. EXAM: MRI HEAD WITHOUT CONTRAST TECHNIQUE: Multiplanar, multiecho pulse sequences of the brain and surrounding structures were obtained without intravenous contrast. COMPARISON:  Head CT same day.  MRI 07/09/2019. FINDINGS: Brain: Diffusion imaging does not show any acute or subacute infarction. Brainstem and cerebellum appear normal. Cerebral hemispheres show moderate chronic small-vessel ischemic changes of the deep and subcortical white matter. Cortical or large vessel territory infarction. Mass lesion, hemorrhage hydrocephalus or extra-axial collection. Vascular: Major vessels at the base of the brain show flow. Skull and upper cervical spine: Negative Sinuses/Orbits: Clear/normal Other: Left parietal scalp injury with staple. IMPRESSION: No acute intracranial finding. Moderate chronic small-vessel ischemic changes of the cerebral hemispheric white matter. Left parietal scalp injury. Electronically Signed   By: Nelson Chimes M.D.   On: 02/07/2020 14:10   DG Pelvis Portable  Result Date: 02/07/2020 CLINICAL DATA:  Fall.  History of dementia. EXAM: PORTABLE PELVIS 1-2 VIEWS COMPARISON:  10/24/2011 CT FINDINGS: Osteopenia. Prior sacralplasty. Femoral heads are located. Skin fold and possible clothing  artifact project over the left greater than right femoral neck. Otherwise, no fracture identified. Sacroiliac joints are grossly symmetric. IMPRESSION: Artifact degraded evaluation of the proximal femurs, especially on the left. If there left hip symptoms, recommend dedicated radiographs. Otherwise, no acute osseous abnormality. Electronically Signed   By: Abigail Miyamoto M.D.   On: 02/07/2020 04:22   CT Hip Left Wo Contrast  Result Date: 02/07/2020 CLINICAL DATA:  Fall with equivocal left hip radiographs. EXAM: CT OF THE LEFT HIP WITHOUT CONTRAST TECHNIQUE: Multidetector CT imaging of the left hip was performed according to the standard protocol. Multiplanar CT image reconstructions were also generated. COMPARISON:  Left hip and pelvic radiographs of earlier today. FINDINGS: Bones/Joint/Cartilage Prior bilateral sacroplasty. Femoral head is located. Moderately comminuted intertrochanteric proximal left femur fracture with mild impaction and  override. Significant, grade 2 L5-S1 anterolisthesis with bilateral pars defects. Ligaments Suboptimally assessed by CT. Muscles and Tendons No gross muscular hematoma. Soft tissues Large rectal stool ball.  No well-defined soft tissue hematoma. IMPRESSION: Intertrochanteric left femur fracture with comminution. Bilateral L5 pars defects with grade 2 L5-S1 anterolisthesis. Rectal stool ball suggesting constipation or fecal impaction. Electronically Signed   By: Abigail Miyamoto M.D.   On: 02/07/2020 07:59   ECHOCARDIOGRAM COMPLETE  Result Date: 02/07/2020    ECHOCARDIOGRAM REPORT   Patient Name:   PLACIDA CAMBRE Date of Exam: 02/07/2020 Medical Rec #:  [id number]      Height:       59.0 in Accession #:    [id number]     Weight:       93.0 lb Date of Birth:  November 20, 1929       BSA:          1.332 m Patient Age:    3 years       BP:           117/70 mmHg Patient Gender: F              HR:           62 bpm. Exam Location:  [facility] Procedure: 2D Echo, Cardiac Doppler and Color Doppler  Indications:     Syncope 780.2  History:         Patient has no prior history of Echocardiogram examinations.                  Headache.  Sonographer:     Sherrie Sport RDCS (AE) Referring Phys:  [id number] Soledad Gerlach NIU Diagnosing Phys: Ida Rogue MD IMPRESSIONS  1. Left ventricular ejection fraction, by estimation, is 60 to 65%. The left ventricle has normal function. The left ventricle has no regional wall motion abnormalities. There is mild left ventricular hypertrophy. Left ventricular diastolic parameters are consistent with Grade I diastolic dysfunction (impaired relaxation).  2. Right ventricular systolic function is normal. The right ventricular size is normal. There is moderately elevated pulmonary artery systolic pressure. The estimated right ventricular systolic pressure is 08.1 mmHg.  3. Mild mitral valve regurgitation.  4. Tricuspid valve regurgitation is mild to moderate. FINDINGS  Left Ventricle: Left ventricular ejection fraction, by estimation, is 60 to 65%. The left ventricle has normal function. The left ventricle has no regional wall motion abnormalities. The left ventricular internal cavity size was normal in size. There is  mild left ventricular hypertrophy. Left ventricular diastolic parameters are consistent with Grade I diastolic dysfunction (impaired relaxation). Right Ventricle: The right ventricular size is normal. No increase in right ventricular wall thickness. Right ventricular systolic function is normal. There is moderately elevated pulmonary artery systolic pressure. The tricuspid regurgitant velocity is 3.39 m/s, and with an assumed right atrial pressure of 5 mmHg, the estimated right ventricular systolic pressure is 44.8 mmHg. Left Atrium: Left atrial size was normal in size. Right Atrium: Right atrial size was normal in size. Pericardium: There is no evidence of pericardial effusion. Mitral Valve: The mitral valve is normal in structure. Mild mitral annular calcification. Mild mitral  valve regurgitation. No evidence of mitral valve stenosis. Tricuspid Valve: The tricuspid valve is normal in structure. Tricuspid valve regurgitation is mild to moderate. No evidence of tricuspid stenosis. Aortic Valve: The aortic valve is normal in structure. Aortic valve regurgitation is trivial. No aortic stenosis is present. Aortic valve mean gradient measures 3.5 mmHg. Aortic valve peak gradient measures  6.5 mmHg. Aortic valve area, by VTI measures 3.33 cm. Pulmonic Valve: The pulmonic valve was normal in structure. Pulmonic valve regurgitation is not visualized. No evidence of pulmonic stenosis. Aorta: The aortic root is normal in size and structure. Venous: The inferior vena cava is normal in size with greater than 50% respiratory variability, suggesting right atrial pressure of 3 mmHg. IAS/Shunts: No atrial level shunt detected by color flow Doppler.  LEFT VENTRICLE PLAX 2D LVIDd:         4.03 cm  Diastology LVIDs:         2.44 cm  LV e' medial:    4.13 cm/s LV PW:         0.97 cm  LV E/e' medial:  16.7 LV IVS:        1.01 cm  LV e' lateral:   4.35 cm/s LVOT diam:     2.00 cm  LV E/e' lateral: 15.9 LV SV:         82 LV SV Index:   62 LVOT Area:     3.14 cm  RIGHT VENTRICLE RV Basal diam:  2.67 cm RV S prime:     12.50 cm/s TAPSE (M-mode): 3.0 cm LEFT ATRIUM             Index       RIGHT ATRIUM           Index LA diam:        3.70 cm 2.78 cm/m  RA Area:     15.00 cm LA Vol (A2C):   34.6 ml 25.98 ml/m RA Volume:   37.60 ml  28.23 ml/m LA Vol (A4C):   15.8 ml 11.86 ml/m LA Biplane Vol: 24.2 ml 18.17 ml/m  AORTIC VALVE                   PULMONIC VALVE AV Area (Vmax):    2.01 cm    PV Vmax:        0.56 m/s AV Area (Vmean):   2.25 cm    PV Peak grad:   1.3 mmHg AV Area (VTI):     3.33 cm    RVOT Peak grad: 3 mmHg AV Vmax:           127.50 cm/s AV Vmean:          85.100 cm/s AV VTI:            0.246 m AV Peak Grad:      6.5 mmHg AV Mean Grad:      3.5 mmHg LVOT Vmax:         81.60 cm/s LVOT Vmean:         61.000 cm/s LVOT VTI:          0.261 m LVOT/AV VTI ratio: 1.06  AORTA Ao Root diam: 2.80 cm MITRAL VALVE                TRICUSPID VALVE MV Area (PHT): 1.77 cm     TR Peak grad:   46.0 mmHg MV Decel Time: 428 msec     TR Vmax:        339.00 cm/s MV E velocity: 69.00 cm/s MV A velocity: 133.00 cm/s  SHUNTS MV E/A ratio:  0.52         Systemic VTI:  0.26 m                             Systemic  Diam: 2.00 cm Ida Rogue MD Electronically signed by Ida Rogue MD Signature Date/Time: 02/07/2020/3:13:01 PM    Final    DG HIP OPERATIVE UNILAT W OR W/O PELVIS LEFT  Result Date: 02/08/2020 CLINICAL DATA:  Intramedullary rod fixation EXAM: OPERATIVE LEFT HIP (WITH PELVIS IF PERFORMED)  VIEWS TECHNIQUE: Fluoroscopic spot image(s) were submitted for interpretation post-operatively. COMPARISON:  February 07, 2020 FINDINGS: Spot fluoroscopy images were obtained for surgical planning purposes. Inter trochanteric fracture is visualized. Fluoroscopic images demonstrate placement of an intramedullary rod in expected alignment. Osteopenia. Fluoro time 36 seconds. IMPRESSION: Spot fluoroscopy images were obtained for surgical planning purposes. Electronically Signed   By: Valentino Saxon MD   On: 02/08/2020 16:28   DG HIP UNILAT WITH PELVIS 2-3 VIEWS LEFT  Result Date: 02/07/2020 CLINICAL DATA:  Left hip pain after a fall. EXAM: DG HIP (WITH OR WITHOUT PELVIS) 2-3V LEFT COMPARISON:  Earlier today, pelvic films. FINDINGS: AP view the pelvis and AP/frog leg views of the left hip. Osteopenia. Prior sacral plasty. Femoral heads are located. There is again clothing and/or skin fold artifacts projecting over the left proximal femur. No convincing evidence of displaced femur fracture. IMPRESSION: Better, but still somewhat limited evaluation of the proximal left femur/left hip. Correlate with overlying clothing artifact. If ongoing clinical suspicion of nondisplaced left hip fracture, consider CT. Electronically Signed   By:  Abigail Miyamoto M.D.   On: 02/07/2020 05:56    Microbiology: Recent Results (from the past 240 hour(s))  Respiratory Panel by RT PCR (Flu A&B, Covid) - Nasopharyngeal Swab     Status: None   Collection Time: 02/07/20  3:32 AM   Specimen: Nasopharyngeal Swab  Result Value Ref Range Status   SARS Coronavirus 2 by RT PCR NEGATIVE NEGATIVE Final    Comment: (NOTE) SARS-CoV-2 target nucleic acids are NOT DETECTED.  The SARS-CoV-2 RNA is generally detectable in upper respiratoy specimens during the acute phase of infection. The lowest concentration of SARS-CoV-2 viral copies this assay can detect is 131 copies/mL. A negative result does not preclude SARS-Cov-2 infection and should not be used as the sole basis for treatment or other patient management decisions. A negative result may occur with  improper specimen collection/handling, submission of specimen other than nasopharyngeal swab, presence of viral mutation(s) within the areas targeted by this assay, and inadequate number of viral copies (<131 copies/mL). A negative result must be combined with clinical observations, patient history, and epidemiological information. The expected result is Negative.  Fact Sheet for Patients:  PinkCheek.be  Fact Sheet for Healthcare Providers:  GravelBags.it  This test is no t yet approved or cleared by the [place] FDA and  has been authorized for detection and/or diagnosis of SARS-CoV-2 by FDA under an Emergency Use Authorization (EUA). This EUA will remain  in effect (meaning this test can be used) for the duration of the COVID-19 declaration under Section 564(b)(1) of the Act, 21 U.S.C. section 360bbb-3(b)(1), unless the authorization is terminated or revoked sooner.     Influenza A by PCR NEGATIVE NEGATIVE Final   Influenza B by PCR NEGATIVE NEGATIVE Final    Comment: (NOTE) The Xpert Xpress SARS-CoV-2/FLU/RSV assay is intended  as an aid in  the diagnosis of influenza from Nasopharyngeal swab specimens and  should not be used as a sole basis for treatment. Nasal washings and  aspirates are unacceptable for Xpert Xpress SARS-CoV-2/FLU/RSV  testing.  Fact Sheet for Patients: PinkCheek.be  Fact Sheet for Healthcare Providers: GravelBags.it  This test  is not yet approved or cleared by the [place] and  has been authorized for detection and/or diagnosis of SARS-CoV-2 by  FDA under an Emergency Use Authorization (EUA). This EUA will remain  in effect (meaning this test can be used) for the duration of the  Covid-19 declaration under Section 564(b)(1) of the Act, 21  U.S.C. section 360bbb-3(b)(1), unless the authorization is  terminated or revoked. Performed at [facility], [address], [place], [place] [address]   Surgical PCR screen     Status: None   Collection Time: 02/07/20  5:36 PM   Specimen: Nasal Mucosa; Nasal Swab  Result Value Ref Range Status   MRSA, PCR NEGATIVE NEGATIVE Final   Staphylococcus aureus NEGATIVE NEGATIVE Final    Comment: (NOTE) The Xpert SA Assay (FDA approved for NASAL specimens in patients 27 years of age and older), is one component of a comprehensive surveillance program. It is not intended to diagnose infection nor to guide or monitor treatment. Performed at [facility], [address] [place], [place] [address]      Labs: Basic Metabolic Panel: Recent Labs  Lab 02/07/20 0332 02/09/20 0348  NA 134* 138  K 3.8 3.7  CL 98 104  CO2 28 26  GLUCOSE 101* 111*  BUN 19 19  CREATININE 0.82 0.55  CALCIUM 9.4 8.8*   Liver Function Tests: Recent Labs  Lab 02/07/20 0332  AST 26  ALT 18  ALKPHOS 44  BILITOT 0.5  PROT 6.6  ALBUMIN 3.8   No results for input(s): LIPASE, AMYLASE in the last 168 hours. No results for input(s): AMMONIA in the last 168 hours. CBC: Recent Labs   Lab 02/07/20 0332 02/08/20 0517 02/09/20 0348 02/10/20 0436 02/12/20 0322  WBC 6.9 9.9 10.6* 8.1 6.7  NEUTROABS 4.6  --   --   --   --   HGB 11.4* 11.6* 9.9* 9.0* 8.2*  HCT 35.2* 35.0* 30.9* 26.7* 24.8*  MCV 95.9 95.6 96.3 94.3 94.7  PLT 252 209 170 165 199   Cardiac Enzymes: No results for input(s): CKTOTAL, CKMB, CKMBINDEX, TROPONINI in the last 168 hours. BNP: BNP (last 3 results) Recent Labs    02/07/20 0338  BNP 68.7    ProBNP (last 3 results) No results for input(s): PROBNP in the last 8760 hours.  CBG: No results for input(s): GLUCAP in the last 168 hours.     Signed:  Desma Maxim MD.  [facility] 02/12/2020, 2:07 PM

## 2020-02-12 NOTE — Plan of Care (Signed)

## 2020-02-17 DIAGNOSIS — K5909 Other constipation: Secondary | ICD-10-CM | POA: Diagnosis not present

## 2020-02-17 DIAGNOSIS — M1388 Other specified arthritis, other site: Secondary | ICD-10-CM | POA: Diagnosis not present

## 2020-02-17 DIAGNOSIS — M0689 Other specified rheumatoid arthritis, multiple sites: Secondary | ICD-10-CM | POA: Diagnosis not present

## 2020-02-17 DIAGNOSIS — S72002A Fracture of unspecified part of neck of left femur, initial encounter for closed fracture: Secondary | ICD-10-CM | POA: Diagnosis not present

## 2020-02-17 DIAGNOSIS — E038 Other specified hypothyroidism: Secondary | ICD-10-CM | POA: Diagnosis not present

## 2020-02-17 DIAGNOSIS — R55 Syncope and collapse: Secondary | ICD-10-CM | POA: Diagnosis not present

## 2020-02-17 DIAGNOSIS — F0280 Dementia in other diseases classified elsewhere without behavioral disturbance: Secondary | ICD-10-CM | POA: Diagnosis not present

## 2020-02-17 DIAGNOSIS — R636 Underweight: Secondary | ICD-10-CM | POA: Diagnosis not present

## 2020-02-17 DIAGNOSIS — S0101XA Laceration without foreign body of scalp, initial encounter: Secondary | ICD-10-CM | POA: Diagnosis not present

## 2020-02-17 DIAGNOSIS — D6489 Other specified anemias: Secondary | ICD-10-CM | POA: Diagnosis not present

## 2020-02-18 DIAGNOSIS — S72002D Fracture of unspecified part of neck of left femur, subsequent encounter for closed fracture with routine healing: Secondary | ICD-10-CM | POA: Diagnosis not present

## 2020-02-18 DIAGNOSIS — R5381 Other malaise: Secondary | ICD-10-CM | POA: Diagnosis not present

## 2020-02-18 DIAGNOSIS — Z7189 Other specified counseling: Secondary | ICD-10-CM | POA: Diagnosis not present

## 2020-02-18 DIAGNOSIS — R52 Pain, unspecified: Secondary | ICD-10-CM | POA: Diagnosis not present

## 2020-02-23 DIAGNOSIS — Z9889 Other specified postprocedural states: Secondary | ICD-10-CM | POA: Diagnosis not present

## 2020-02-23 DIAGNOSIS — Z8781 Personal history of (healed) traumatic fracture: Secondary | ICD-10-CM | POA: Diagnosis not present

## 2020-02-23 DIAGNOSIS — S72142D Displaced intertrochanteric fracture of left femur, subsequent encounter for closed fracture with routine healing: Secondary | ICD-10-CM | POA: Diagnosis not present

## 2020-02-25 DIAGNOSIS — R52 Pain, unspecified: Secondary | ICD-10-CM | POA: Diagnosis not present

## 2020-02-25 DIAGNOSIS — R5381 Other malaise: Secondary | ICD-10-CM | POA: Diagnosis not present

## 2020-02-25 DIAGNOSIS — S72002D Fracture of unspecified part of neck of left femur, subsequent encounter for closed fracture with routine healing: Secondary | ICD-10-CM | POA: Diagnosis not present

## 2020-03-01 DIAGNOSIS — R279 Unspecified lack of coordination: Secondary | ICD-10-CM | POA: Diagnosis not present

## 2020-03-01 DIAGNOSIS — M6281 Muscle weakness (generalized): Secondary | ICD-10-CM | POA: Diagnosis not present

## 2020-03-01 DIAGNOSIS — R488 Other symbolic dysfunctions: Secondary | ICD-10-CM | POA: Diagnosis not present

## 2020-03-01 DIAGNOSIS — S72142D Displaced intertrochanteric fracture of left femur, subsequent encounter for closed fracture with routine healing: Secondary | ICD-10-CM | POA: Diagnosis not present

## 2020-03-01 DIAGNOSIS — R2689 Other abnormalities of gait and mobility: Secondary | ICD-10-CM | POA: Diagnosis not present

## 2020-03-01 DIAGNOSIS — R1312 Dysphagia, oropharyngeal phase: Secondary | ICD-10-CM | POA: Diagnosis not present

## 2020-03-04 DIAGNOSIS — M0689 Other specified rheumatoid arthritis, multiple sites: Secondary | ICD-10-CM | POA: Diagnosis not present

## 2020-03-04 DIAGNOSIS — S72002D Fracture of unspecified part of neck of left femur, subsequent encounter for closed fracture with routine healing: Secondary | ICD-10-CM | POA: Diagnosis not present

## 2020-03-04 DIAGNOSIS — R55 Syncope and collapse: Secondary | ICD-10-CM | POA: Diagnosis not present

## 2020-03-04 DIAGNOSIS — D6489 Other specified anemias: Secondary | ICD-10-CM | POA: Diagnosis not present

## 2020-03-04 DIAGNOSIS — R636 Underweight: Secondary | ICD-10-CM | POA: Diagnosis not present

## 2020-03-04 DIAGNOSIS — S0101XD Laceration without foreign body of scalp, subsequent encounter: Secondary | ICD-10-CM | POA: Diagnosis not present

## 2020-03-04 DIAGNOSIS — M1388 Other specified arthritis, other site: Secondary | ICD-10-CM | POA: Diagnosis not present

## 2020-03-04 DIAGNOSIS — F0280 Dementia in other diseases classified elsewhere without behavioral disturbance: Secondary | ICD-10-CM | POA: Diagnosis not present

## 2020-03-04 DIAGNOSIS — K5790 Diverticulosis of intestine, part unspecified, without perforation or abscess without bleeding: Secondary | ICD-10-CM | POA: Diagnosis not present

## 2020-03-04 DIAGNOSIS — E038 Other specified hypothyroidism: Secondary | ICD-10-CM | POA: Diagnosis not present

## 2020-03-04 DIAGNOSIS — K5909 Other constipation: Secondary | ICD-10-CM | POA: Diagnosis not present

## 2020-03-06 DIAGNOSIS — Z9181 History of falling: Secondary | ICD-10-CM | POA: Diagnosis not present

## 2020-03-06 DIAGNOSIS — E039 Hypothyroidism, unspecified: Secondary | ICD-10-CM | POA: Diagnosis not present

## 2020-03-06 DIAGNOSIS — I82503 Chronic embolism and thrombosis of unspecified deep veins of lower extremity, bilateral: Secondary | ICD-10-CM | POA: Diagnosis not present

## 2020-03-06 DIAGNOSIS — M069 Rheumatoid arthritis, unspecified: Secondary | ICD-10-CM | POA: Diagnosis not present

## 2020-03-06 DIAGNOSIS — D649 Anemia, unspecified: Secondary | ICD-10-CM | POA: Diagnosis not present

## 2020-03-06 DIAGNOSIS — K5900 Constipation, unspecified: Secondary | ICD-10-CM | POA: Diagnosis not present

## 2020-03-06 DIAGNOSIS — Z79891 Long term (current) use of opiate analgesic: Secondary | ICD-10-CM | POA: Diagnosis not present

## 2020-03-06 DIAGNOSIS — M80052D Age-related osteoporosis with current pathological fracture, left femur, subsequent encounter for fracture with routine healing: Secondary | ICD-10-CM | POA: Diagnosis not present

## 2020-03-06 DIAGNOSIS — K5790 Diverticulosis of intestine, part unspecified, without perforation or abscess without bleeding: Secondary | ICD-10-CM | POA: Diagnosis not present

## 2020-03-06 DIAGNOSIS — J9621 Acute and chronic respiratory failure with hypoxia: Secondary | ICD-10-CM | POA: Diagnosis not present

## 2020-03-06 DIAGNOSIS — F0280 Dementia in other diseases classified elsewhere without behavioral disturbance: Secondary | ICD-10-CM | POA: Diagnosis not present

## 2020-03-06 DIAGNOSIS — Z7983 Long term (current) use of bisphosphonates: Secondary | ICD-10-CM | POA: Diagnosis not present

## 2020-03-10 DIAGNOSIS — J8489 Other specified interstitial pulmonary diseases: Secondary | ICD-10-CM | POA: Diagnosis not present

## 2020-03-10 DIAGNOSIS — I2510 Atherosclerotic heart disease of native coronary artery without angina pectoris: Secondary | ICD-10-CM | POA: Diagnosis not present

## 2020-03-10 DIAGNOSIS — M0609 Rheumatoid arthritis without rheumatoid factor, multiple sites: Secondary | ICD-10-CM | POA: Diagnosis not present

## 2020-03-10 DIAGNOSIS — M810 Age-related osteoporosis without current pathological fracture: Secondary | ICD-10-CM | POA: Diagnosis not present

## 2020-03-10 DIAGNOSIS — G63 Polyneuropathy in diseases classified elsewhere: Secondary | ICD-10-CM | POA: Diagnosis not present

## 2020-03-10 DIAGNOSIS — E559 Vitamin D deficiency, unspecified: Secondary | ICD-10-CM | POA: Diagnosis not present

## 2020-03-10 DIAGNOSIS — E7849 Other hyperlipidemia: Secondary | ICD-10-CM | POA: Diagnosis not present

## 2020-03-10 DIAGNOSIS — M5136 Other intervertebral disc degeneration, lumbar region: Secondary | ICD-10-CM | POA: Diagnosis not present

## 2020-03-10 DIAGNOSIS — D638 Anemia in other chronic diseases classified elsewhere: Secondary | ICD-10-CM | POA: Diagnosis not present

## 2020-03-10 DIAGNOSIS — F0390 Unspecified dementia without behavioral disturbance: Secondary | ICD-10-CM | POA: Diagnosis not present

## 2020-03-10 DIAGNOSIS — D692 Other nonthrombocytopenic purpura: Secondary | ICD-10-CM | POA: Diagnosis not present

## 2020-03-10 DIAGNOSIS — Z23 Encounter for immunization: Secondary | ICD-10-CM | POA: Diagnosis not present

## 2020-03-10 DIAGNOSIS — E611 Iron deficiency: Secondary | ICD-10-CM | POA: Diagnosis not present

## 2020-03-10 DIAGNOSIS — Z8781 Personal history of (healed) traumatic fracture: Secondary | ICD-10-CM | POA: Diagnosis not present

## 2020-03-10 DIAGNOSIS — E034 Atrophy of thyroid (acquired): Secondary | ICD-10-CM | POA: Diagnosis not present

## 2020-03-11 DIAGNOSIS — E611 Iron deficiency: Secondary | ICD-10-CM | POA: Diagnosis not present

## 2020-03-11 DIAGNOSIS — Z23 Encounter for immunization: Secondary | ICD-10-CM | POA: Diagnosis not present

## 2020-03-11 DIAGNOSIS — D638 Anemia in other chronic diseases classified elsewhere: Secondary | ICD-10-CM | POA: Diagnosis not present

## 2020-03-11 DIAGNOSIS — G63 Polyneuropathy in diseases classified elsewhere: Secondary | ICD-10-CM | POA: Diagnosis not present

## 2020-03-11 DIAGNOSIS — E034 Atrophy of thyroid (acquired): Secondary | ICD-10-CM | POA: Diagnosis not present

## 2020-03-11 DIAGNOSIS — M0609 Rheumatoid arthritis without rheumatoid factor, multiple sites: Secondary | ICD-10-CM | POA: Diagnosis not present

## 2020-03-18 DIAGNOSIS — M810 Age-related osteoporosis without current pathological fracture: Secondary | ICD-10-CM | POA: Diagnosis not present

## 2020-03-18 DIAGNOSIS — G629 Polyneuropathy, unspecified: Secondary | ICD-10-CM | POA: Diagnosis not present

## 2020-03-18 DIAGNOSIS — J8489 Other specified interstitial pulmonary diseases: Secondary | ICD-10-CM | POA: Diagnosis not present

## 2020-03-18 DIAGNOSIS — Z79899 Other long term (current) drug therapy: Secondary | ICD-10-CM | POA: Diagnosis not present

## 2020-03-18 DIAGNOSIS — M0579 Rheumatoid arthritis with rheumatoid factor of multiple sites without organ or systems involvement: Secondary | ICD-10-CM | POA: Diagnosis not present

## 2020-03-22 DIAGNOSIS — G309 Alzheimer's disease, unspecified: Secondary | ICD-10-CM | POA: Diagnosis not present

## 2020-03-22 DIAGNOSIS — F0150 Vascular dementia without behavioral disturbance: Secondary | ICD-10-CM | POA: Diagnosis not present

## 2020-03-22 DIAGNOSIS — H539 Unspecified visual disturbance: Secondary | ICD-10-CM | POA: Diagnosis not present

## 2020-03-22 DIAGNOSIS — F0280 Dementia in other diseases classified elsewhere without behavioral disturbance: Secondary | ICD-10-CM | POA: Diagnosis not present

## 2020-03-22 DIAGNOSIS — G608 Other hereditary and idiopathic neuropathies: Secondary | ICD-10-CM | POA: Diagnosis not present

## 2020-03-29 DIAGNOSIS — S72142D Displaced intertrochanteric fracture of left femur, subsequent encounter for closed fracture with routine healing: Secondary | ICD-10-CM | POA: Diagnosis not present

## 2020-03-31 DIAGNOSIS — E039 Hypothyroidism, unspecified: Secondary | ICD-10-CM | POA: Diagnosis not present

## 2020-03-31 DIAGNOSIS — K5900 Constipation, unspecified: Secondary | ICD-10-CM | POA: Diagnosis not present

## 2020-03-31 DIAGNOSIS — M069 Rheumatoid arthritis, unspecified: Secondary | ICD-10-CM | POA: Diagnosis not present

## 2020-03-31 DIAGNOSIS — M80052D Age-related osteoporosis with current pathological fracture, left femur, subsequent encounter for fracture with routine healing: Secondary | ICD-10-CM | POA: Diagnosis not present

## 2020-03-31 DIAGNOSIS — Z79891 Long term (current) use of opiate analgesic: Secondary | ICD-10-CM | POA: Diagnosis not present

## 2020-03-31 DIAGNOSIS — Z7983 Long term (current) use of bisphosphonates: Secondary | ICD-10-CM | POA: Diagnosis not present

## 2020-03-31 DIAGNOSIS — J9621 Acute and chronic respiratory failure with hypoxia: Secondary | ICD-10-CM | POA: Diagnosis not present

## 2020-03-31 DIAGNOSIS — D649 Anemia, unspecified: Secondary | ICD-10-CM | POA: Diagnosis not present

## 2020-03-31 DIAGNOSIS — K5790 Diverticulosis of intestine, part unspecified, without perforation or abscess without bleeding: Secondary | ICD-10-CM | POA: Diagnosis not present

## 2020-03-31 DIAGNOSIS — F0280 Dementia in other diseases classified elsewhere without behavioral disturbance: Secondary | ICD-10-CM | POA: Diagnosis not present

## 2020-03-31 DIAGNOSIS — I82503 Chronic embolism and thrombosis of unspecified deep veins of lower extremity, bilateral: Secondary | ICD-10-CM | POA: Diagnosis not present

## 2020-03-31 DIAGNOSIS — Z9181 History of falling: Secondary | ICD-10-CM | POA: Diagnosis not present

## 2020-04-14 IMAGING — MR MR HEAD WO/W CM
14 series · 48 of 48 positions shown · IV contrast (gadavist)
Comparison: Head CT 07/07/2019 and MRI 02/15/2012

CLINICAL DATA: Headaches, hand tremors, and memory loss. Fell and
hit head 3 weeks ago.

EXAM:
MRI HEAD WITHOUT AND WITH CONTRAST
TECHNIQUE: Multiplanar, multiecho pulse sequences of the brain and surrounding
structures were obtained without and with intravenous contrast.
CONTRAST:  4mL GADAVIST GADOBUTROL 1 MMOL/ML IV SOLN

[Series 5: ax dwi_tracew · axial · 3.0mm · 0.60mm/px · z∈[-125,+23]mm · 2 of 46 slices shown]
[im 1/46]
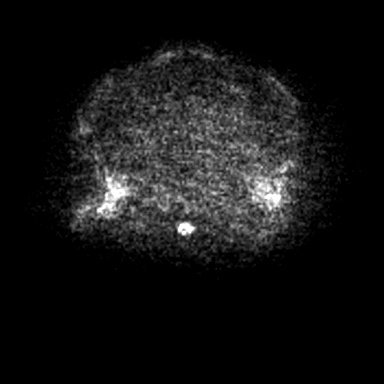
[im 46/46]
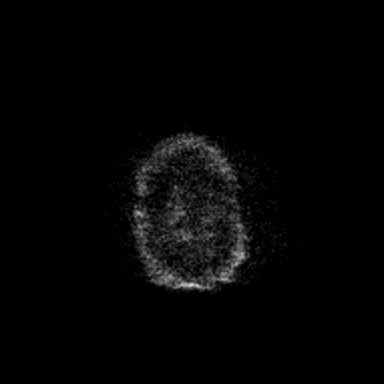

[Series 6: ax dwi_adc · axial · 3.0mm · 0.60mm/px · z∈[-125,+23]mm · 3 of 46 slices shown]
[im 1/46]
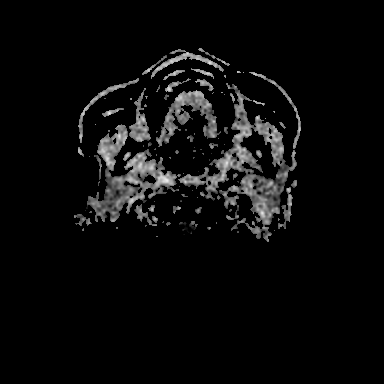
[im 23/46]
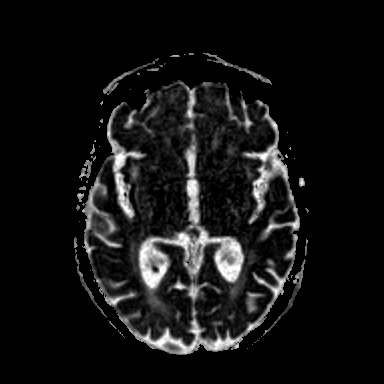
[im 46/46]
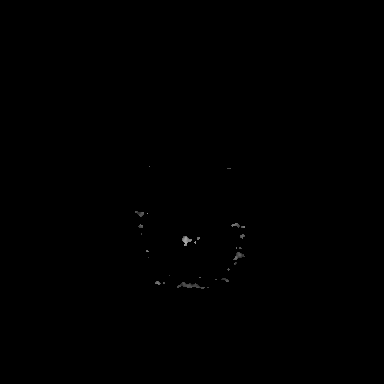

[Series 7: cor dwi_tracew · coronal · 5.0mm · 0.60mm/px · 2 of 36 slices shown]
[im 1/36]
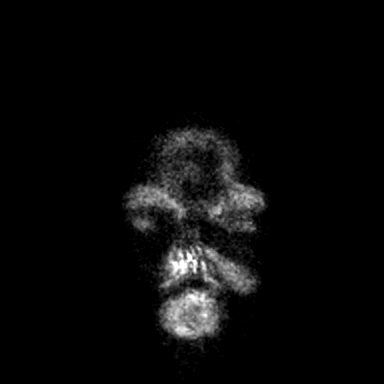
[im 36/36]
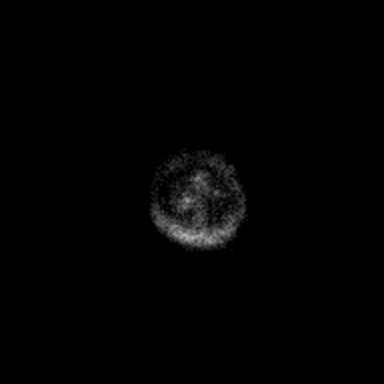

[Series 8: cor dwi_adc · coronal · 5.0mm · 0.60mm/px · 2 of 36 slices shown]
[im 1/36]
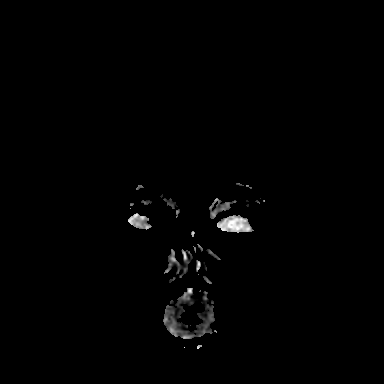
[im 36/36]
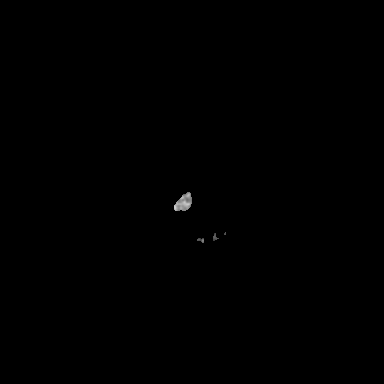

[Series 9: T1 · sagittal · 5.0mm · 0.62mm/px · 1 of 20 slices shown (1 of 2)]
[im 1/20]
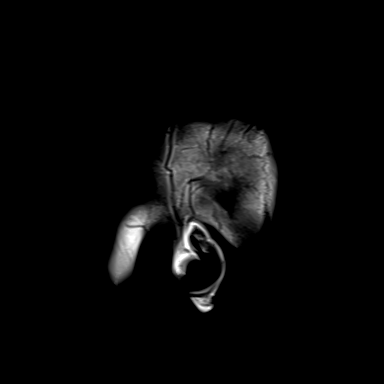

[Series 10: T2 · axial · 5.0mm · 0.53mm/px · z∈[-122,+21]mm · 2 of 25 slices shown]
[im 1/25]
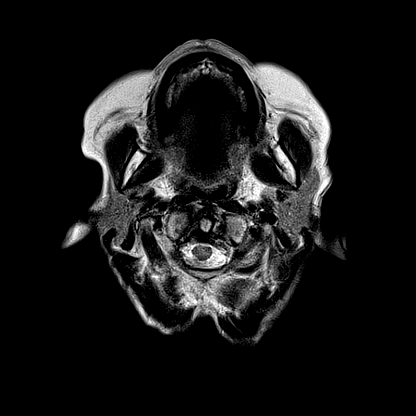
[im 25/25]
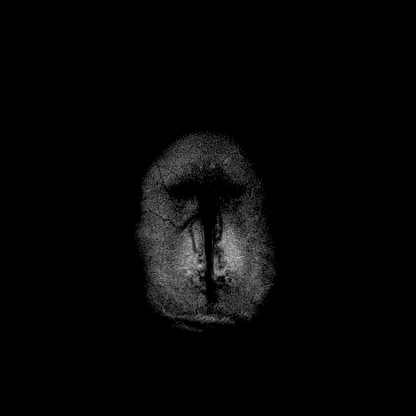

[Series 11: mag_images · axial · 3.0mm · 0.90mm/px · z∈[-139,+37]mm · 4 of 60 slices shown]
[im 1/60]
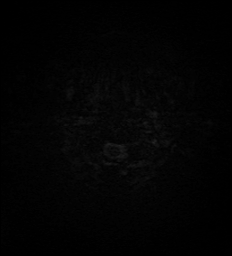
[im 20/60]
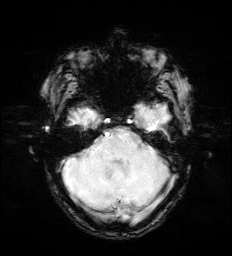
[im 40/60]
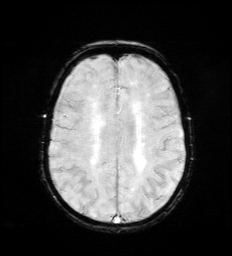
[im 60/60]
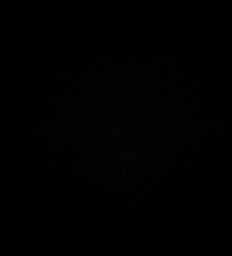

[Series 12: pha_images · axial · 3.0mm · 0.90mm/px · z∈[-136,+37]mm · 3 of 57 slices shown]
[im 1/57]
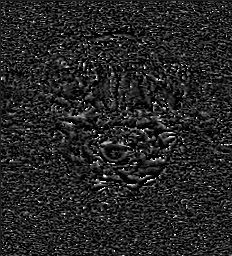
[im 29/57]
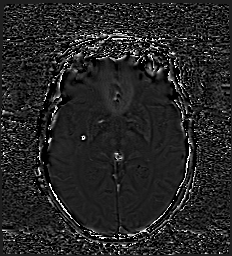
[im 57/57]
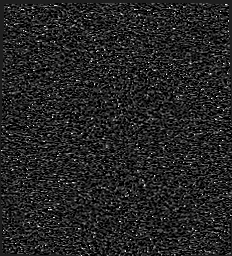

[Series 13: swi_images · axial · 3.0mm · 0.90mm/px · z∈[-139,+37]mm · 4 of 60 slices shown]
[im 1/60]
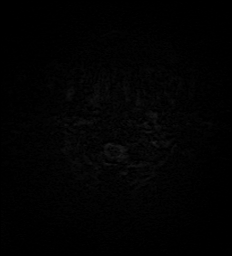
[im 20/60]
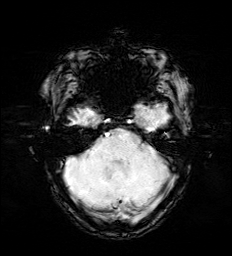
[im 40/60]
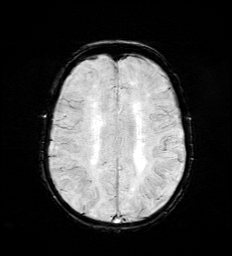
[im 60/60]
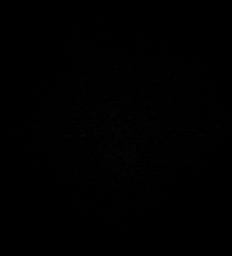

[Series 15: FLAIR · axial · 3.0mm · 0.53mm/px · z∈[-131,+30]mm · 3 of 55 slices shown]
[im 1/55]
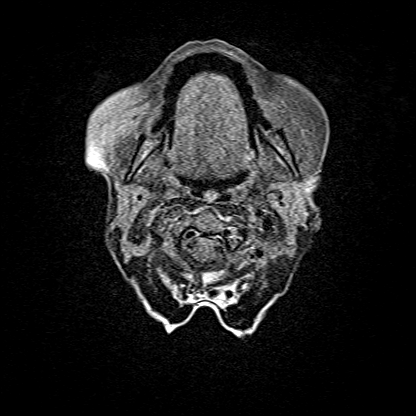
[im 28/55]
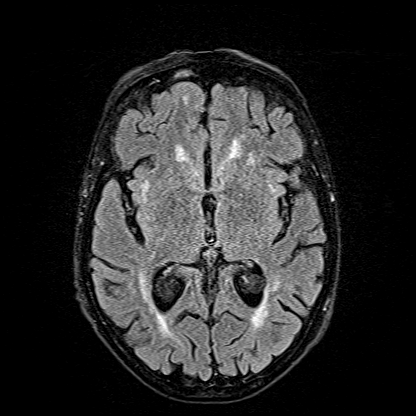
[im 55/55]
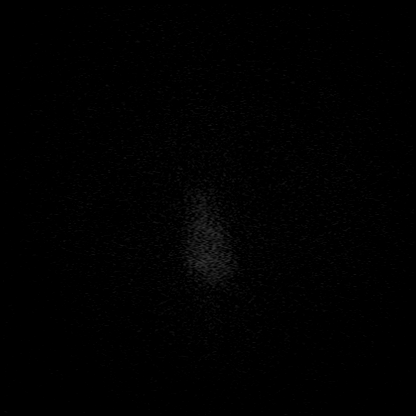

[Series 16: T1 · axial · 1.0mm · 0.98mm/px · z∈[-122,+20]mm · 9 of 144 slices shown (2 of 2)]
[im 1/144]
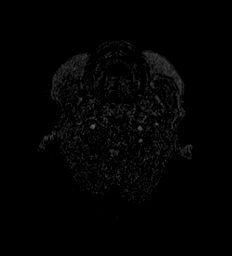
[im 18/144]
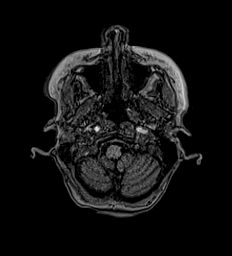
[im 36/144]
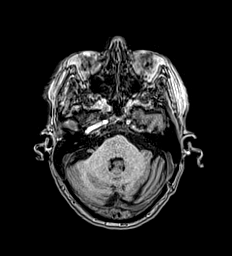
[im 54/144]
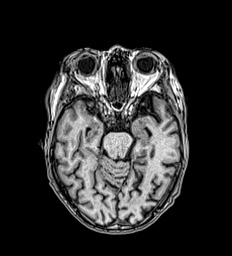
[im 72/144]
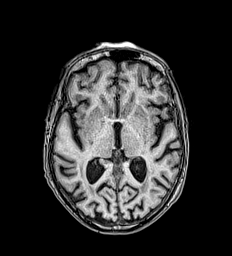
[im 90/144]
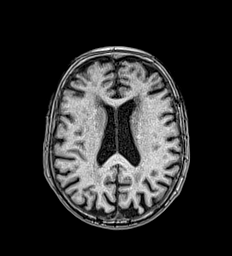
[im 108/144]
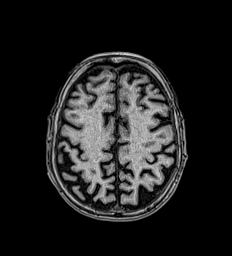
[im 126/144]
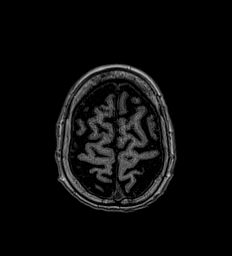
[im 144/144]
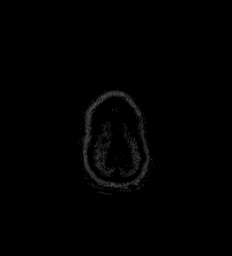

[Series 17: T2 post-contrast · coronal · 5.0mm · 0.57mm/px · 2 of 28 slices shown]
[im 1/28]
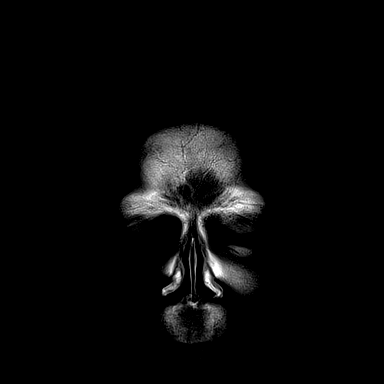
[im 28/28]
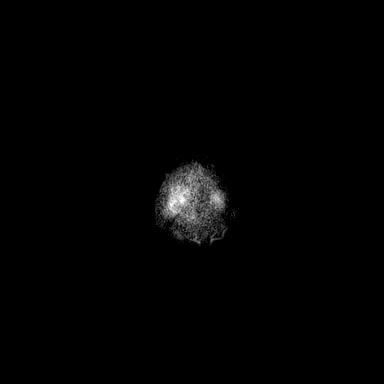

[Series 18: T1 post-contrast · axial · 1.0mm · 0.98mm/px · z∈[-122,+20]mm · 9 of 144 slices shown (1 of 2)]
[im 1/144]
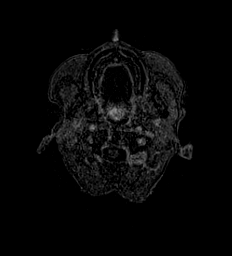
[im 18/144]
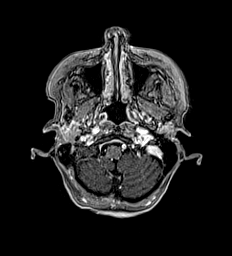
[im 36/144]
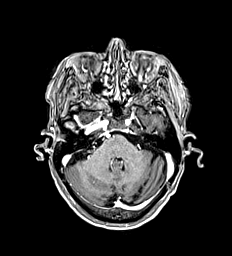
[im 54/144]
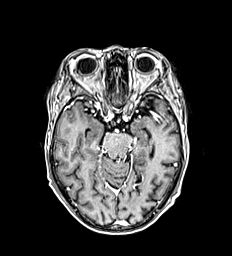
[im 72/144]
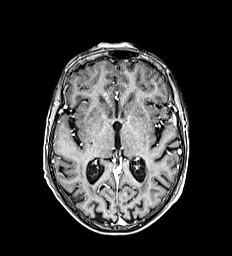
[im 90/144]
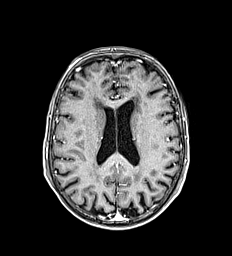
[im 108/144]
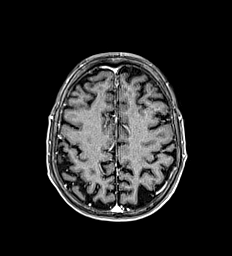
[im 126/144]
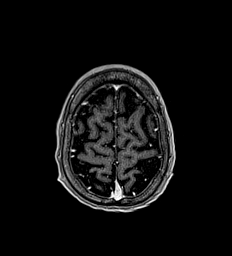
[im 144/144]
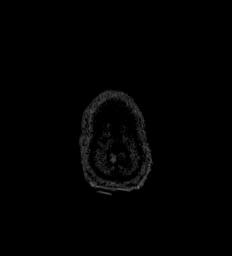

[Series 19: T1 post-contrast · coronal · 5.0mm · 0.57mm/px · 2 of 28 slices shown (2 of 2)]
[im 1/28]
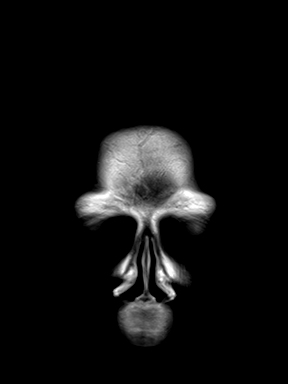
[im 28/28]
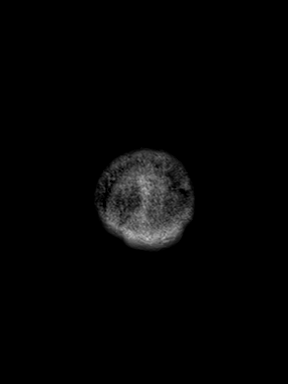

[48 of 48 positions shown; findings below may reference images not displayed]

FINDINGS: Brain: No acute infarct, mass, midline shift, or extra-axial fluid
collection is identified. Chronic microhemorrhages are noted in the
right external capsule and right cerebellum without hemorrhage
sensitive imaging performed on the prior MRI. Patchy T2
hyperintensities in the cerebral white matter bilaterally have
substantially progressed from the prior MRI and are nonspecific but
compatible with moderate chronic small vessel ischemic disease. A
small chronic infarct or other remote insult is again seen laterally
in the right cerebellar hemisphere. Generalized cerebral atrophy has
mildly progressed from the prior MRI and is mild for age. No
abnormal enhancement is identified.

Vascular: Major intracranial vascular flow voids are preserved with
the right vertebral artery being hypoplastic.

Skull and upper cervical spine: Unremarkable bone marrow signal.

Sinuses/Orbits: Bilateral cataract extraction. Paranasal sinuses and
mastoid air cells are clear. Small right mastoid effusion.

Other: None.
IMPRESSION: 1. No acute intracranial abnormality.
2. Moderate chronic small vessel ischemic disease and mild cerebral
atrophy, both progressed from 8288.

## 2020-04-20 DIAGNOSIS — I82503 Chronic embolism and thrombosis of unspecified deep veins of lower extremity, bilateral: Secondary | ICD-10-CM | POA: Diagnosis not present

## 2020-04-20 DIAGNOSIS — J9621 Acute and chronic respiratory failure with hypoxia: Secondary | ICD-10-CM | POA: Diagnosis not present

## 2020-04-20 DIAGNOSIS — D649 Anemia, unspecified: Secondary | ICD-10-CM | POA: Diagnosis not present

## 2020-04-20 DIAGNOSIS — R634 Abnormal weight loss: Secondary | ICD-10-CM | POA: Diagnosis not present

## 2020-04-20 DIAGNOSIS — F0280 Dementia in other diseases classified elsewhere without behavioral disturbance: Secondary | ICD-10-CM | POA: Diagnosis not present

## 2020-04-20 DIAGNOSIS — K5900 Constipation, unspecified: Secondary | ICD-10-CM | POA: Diagnosis not present

## 2020-04-20 DIAGNOSIS — M80052D Age-related osteoporosis with current pathological fracture, left femur, subsequent encounter for fracture with routine healing: Secondary | ICD-10-CM | POA: Diagnosis not present

## 2020-04-20 DIAGNOSIS — R110 Nausea: Secondary | ICD-10-CM | POA: Diagnosis not present

## 2020-04-20 DIAGNOSIS — R1312 Dysphagia, oropharyngeal phase: Secondary | ICD-10-CM | POA: Diagnosis not present

## 2020-04-20 DIAGNOSIS — E039 Hypothyroidism, unspecified: Secondary | ICD-10-CM | POA: Diagnosis not present

## 2020-04-28 DIAGNOSIS — S72142D Displaced intertrochanteric fracture of left femur, subsequent encounter for closed fracture with routine healing: Secondary | ICD-10-CM | POA: Diagnosis not present

## 2020-05-01 DIAGNOSIS — Z111 Encounter for screening for respiratory tuberculosis: Secondary | ICD-10-CM | POA: Diagnosis not present

## 2020-05-08 ENCOUNTER — Emergency Department: Payer: PPO

## 2020-05-08 ENCOUNTER — Emergency Department
Admission: EM | Admit: 2020-05-08 | Discharge: 2020-05-08 | Disposition: A | Discharge status: Home / Self Care | Payer: PPO | Attending: Emergency Medicine | Admitting: Emergency Medicine

## 2020-05-08 ENCOUNTER — Other Ambulatory Visit: Payer: Self-pay

## 2020-05-08 ENCOUNTER — Encounter: Payer: Self-pay | Admitting: Intensive Care

## 2020-05-08 DIAGNOSIS — Y92009 Unspecified place in unspecified non-institutional (private) residence as the place of occurrence of the external cause: Secondary | ICD-10-CM

## 2020-05-08 DIAGNOSIS — S51012A Laceration without foreign body of left elbow, initial encounter: Secondary | ICD-10-CM | POA: Insufficient documentation

## 2020-05-08 DIAGNOSIS — R0781 Pleurodynia: Secondary | ICD-10-CM | POA: Diagnosis not present

## 2020-05-08 DIAGNOSIS — F0390 Unspecified dementia without behavioral disturbance: Secondary | ICD-10-CM | POA: Insufficient documentation

## 2020-05-08 DIAGNOSIS — R404 Transient alteration of awareness: Secondary | ICD-10-CM | POA: Diagnosis not present

## 2020-05-08 DIAGNOSIS — S51019A Laceration without foreign body of unspecified elbow, initial encounter: Secondary | ICD-10-CM

## 2020-05-08 DIAGNOSIS — W19XXXA Unspecified fall, initial encounter: Secondary | ICD-10-CM

## 2020-05-08 DIAGNOSIS — R52 Pain, unspecified: Secondary | ICD-10-CM | POA: Diagnosis not present

## 2020-05-08 DIAGNOSIS — M25512 Pain in left shoulder: Secondary | ICD-10-CM | POA: Diagnosis not present

## 2020-05-08 DIAGNOSIS — Z79899 Other long term (current) drug therapy: Secondary | ICD-10-CM | POA: Diagnosis not present

## 2020-05-08 DIAGNOSIS — E039 Hypothyroidism, unspecified: Secondary | ICD-10-CM | POA: Insufficient documentation

## 2020-05-08 DIAGNOSIS — I10 Essential (primary) hypertension: Secondary | ICD-10-CM | POA: Diagnosis not present

## 2020-05-08 DIAGNOSIS — Z87891 Personal history of nicotine dependence: Secondary | ICD-10-CM | POA: Diagnosis not present

## 2020-05-08 DIAGNOSIS — S2232XA Fracture of one rib, left side, initial encounter for closed fracture: Secondary | ICD-10-CM | POA: Diagnosis not present

## 2020-05-08 DIAGNOSIS — R0902 Hypoxemia: Secondary | ICD-10-CM | POA: Diagnosis not present

## 2020-05-08 DIAGNOSIS — Y9301 Activity, walking, marching and hiking: Secondary | ICD-10-CM | POA: Insufficient documentation

## 2020-05-08 DIAGNOSIS — Z043 Encounter for examination and observation following other accident: Secondary | ICD-10-CM | POA: Diagnosis not present

## 2020-05-08 DIAGNOSIS — M25511 Pain in right shoulder: Secondary | ICD-10-CM | POA: Diagnosis not present

## 2020-05-08 DIAGNOSIS — W010XXA Fall on same level from slipping, tripping and stumbling without subsequent striking against object, initial encounter: Secondary | ICD-10-CM | POA: Insufficient documentation

## 2020-05-08 DIAGNOSIS — S299XXA Unspecified injury of thorax, initial encounter: Secondary | ICD-10-CM | POA: Diagnosis present

## 2020-05-08 DIAGNOSIS — Z7901 Long term (current) use of anticoagulants: Secondary | ICD-10-CM | POA: Insufficient documentation

## 2020-05-08 LAB — CBC
HCT: 37.1 % (ref 36.0–46.0)
Hemoglobin: 11.9 g/dL — ABNORMAL LOW (ref 12.0–15.0)
MCH: 29.7 pg (ref 26.0–34.0)
MCHC: 32.1 g/dL (ref 30.0–36.0)
MCV: 92.5 fL (ref 80.0–100.0)
Platelets: 330 10*3/uL (ref 150–400)
RBC: 4.01 MIL/uL (ref 3.87–5.11)
RDW: 13.2 % (ref 11.5–15.5)
WBC: 8.5 10*3/uL (ref 4.0–10.5)
nRBC: 0 % (ref 0.0–0.2)

## 2020-05-08 LAB — COMPREHENSIVE METABOLIC PANEL
ALT: 14 U/L (ref 0–44)
AST: 23 U/L (ref 15–41)
Albumin: 3.9 g/dL (ref 3.5–5.0)
Alkaline Phosphatase: 109 U/L (ref 38–126)
Anion gap: 10 (ref 5–15)
BUN: 23 mg/dL (ref 8–23)
CO2: 29 mmol/L (ref 22–32)
Calcium: 10.3 mg/dL (ref 8.9–10.3)
Chloride: 98 mmol/L (ref 98–111)
Creatinine, Ser: 0.56 mg/dL (ref 0.44–1.00)
GFR, Estimated: >60 mL/min (ref >60)
Glucose, Bld: 104 mg/dL — ABNORMAL HIGH (ref 70–99)
Potassium: 4.1 mmol/L (ref 3.5–5.1)
Sodium: 137 mmol/L (ref 135–145)
Total Bilirubin: 0.6 mg/dL (ref 0.3–1.2)
Total Protein: 7.5 g/dL (ref 6.5–8.1)

## 2020-05-08 LAB — LIPASE, BLOOD: Lipase: 26 U/L (ref 11–51)

## 2020-05-08 MED ORDER — TRAMADOL HCL 50 MG PO TABS
50.0000 mg | ORAL_TABLET | Freq: Once | ORAL | Status: AC
  Administered 2020-05-08: 50 mg via ORAL
  Filled 2020-05-08: qty 1

## 2020-05-08 NOTE — ED Triage Notes (Signed)
Patient presents from [facility] for two falls last night for attempting to walk without walker. Per daughter [name] [phone]) patient c/o left lower abdominal pain bilateral shoulder pain, left sided rib cage pain, and left arm pain. Skin tears presents on left arm. Patient has dementia. Daughter that is not POA at bedside

## 2020-05-08 NOTE — ED Provider Notes (Signed)
[facility] Emergency Department Provider Note ____________________________________________  Time seen: 1635  I have reviewed the triage vital signs and the nursing notes.  HISTORY  Chief Complaint  Fall  History provided by adult daughter who was present in the room.  HPI Emma Kennedy is a 87 y.o. female presents to the ED via EMS from [facility], her assisted living  facility. Patient reportedly had 2 separate mechanical falls overnight at that she attempted to walk without the use of her walker. Patient's primary complaints are left lower chest wall pain as well as some pain into the left elbow and left and right shoulders. She has a skin tear of the left elbow but no frank pain. Patient has dementia, but otherwise denies any head injury or loss of consciousness. According to the daughter who is present and provides some history, the patient is of her normal level of activity and cognition.  Past Medical History:  Diagnosis Date  . Anemia 2005  . Arthritis   . Diverticulosis   . Headache 2013  . Osteoarthritis   . Osteoporosis   . Thyroid disease     Patient Active Problem List   Diagnosis Date Noted  . Scalp laceration   . Closed left hip fracture ([place]) 02/07/2020  . Syncope 02/07/2020  . Dementia without behavioral disturbance ([place]) 02/07/2020  . RA (rheumatoid arthritis) ([place])   . Normocytic anemia   . Hypothyroidism   . Fall     Past Surgical History:  Procedure Laterality Date  . ABDOMINAL HYSTERECTOMY    . APPENDECTOMY    . bilateral foot surgery     . BLADDER SURGERY     bladder tack x 2  . COLON SURGERY  1998   removal of part of colon   . EYE SURGERY    . INTRAMEDULLARY (IM) NAIL INTERTROCHANTERIC Left 02/08/2020   Procedure: INTRAMEDULLARY (IM) NAIL INTERTROCHANTRIC;  Surgeon: Corky Mull, MD;  Location: [facility] ORS;  Service: Orthopedics;  Laterality: Left;  . SACROPLASTY N/A 08/13/2018   Procedure: SACROPLASTY;  Surgeon:  Hessie Knows, MD;  Location: [facility] ORS;  Service: Orthopedics;  Laterality: N/A;    Prior to Admission medications   Medication Sig Start Date End Date Taking? Authorizing Provider  Melatonin 10 MG TABS Take 10 mg by mouth.   Yes [provider]  mirtazapine (REMERON) 15 MG tablet Take 7.5 mg by mouth at bedtime.   Yes [provider]  omeprazole (PRILOSEC) 20 MG capsule Take 20 mg by mouth daily.   Yes [provider]  acetaminophen (TYLENOL) 325 MG tablet Take 2 tablets (650 mg total) by mouth every 6 (six) hours as needed. Patient not taking: Reported on 08/12/2018 05/01/16   Carrie Mew, MD  alendronate (FOSAMAX) 70 MG tablet Take 70 mg by mouth once a week. 08/10/17   [provider]  Cholecalciferol (VITAMIN D3) 25 MCG (1000 UT) CAPS Take 1,000 Units by mouth daily.     [provider]  Cyanocobalamin (VITAMIN B-12 PO) Take 5,000 mcg by mouth daily.    [provider]  diclofenac sodium (VOLTAREN) 1 % GEL Apply 2 g topically 4 (four) times daily. 05/01/16   Carrie Mew, MD  donepezil (ARICEPT) 10 MG tablet Take 10 mg by mouth at bedtime. 01/22/20   [provider]  DULoxetine (CYMBALTA) 20 MG capsule Take 40 mg by mouth daily. 01/22/20   [provider]  enoxaparin (LOVENOX) 30 MG/0.3ML injection Inject 0.3 mLs (30 mg  total) into the skin daily. 02/11/20   Lattie Corns, PA-C  feeding supplement (ENSURE ENLIVE / ENSURE PLUS) LIQD Take 237 mLs by mouth 2 (two) times daily between meals. 02/12/20   Wouk, Ailene Rud, MD  ferrous sulfate 325 (65 FE) MG EC tablet Take 1 tablet (325 mg total) by mouth 2 (two) times daily. 02/12/20 02/11/21  Wouk, Ailene Rud, MD  gabapentin (NEURONTIN) 300 MG capsule Take 300 mg by mouth 2 (two) times daily.  07/23/13   [provider]  hydroxychloroquine (PLAQUENIL) 200 MG tablet Take 200 mg by mouth daily. 02/27/18   [provider]  levothyroxine (SYNTHROID,  LEVOTHROID) 112 MCG tablet Take 112 mcg by mouth daily before breakfast.  08/11/13   [provider]  MYRBETRIQ 25 MG TB24 tablet Take 25 mg by mouth daily. 01/22/20   [provider]  Polyethyl Glycol-Propyl Glycol (SYSTANE OP) Place 1 drop into both eyes 2 (two) times daily as needed (dry eyes).     [provider]  polyethylene glycol powder (GLYCOLAX/MIRALAX) powder Take 1 Container by mouth daily as needed for moderate constipation.  08/15/13   [provider]  senna-docusate (SENOKOT-S) 8.6-50 MG tablet Take 1 tablet by mouth at bedtime as needed for mild constipation. 02/12/20   Wouk, Ailene Rud, MD  sulfaSALAzine (AZULFIDINE) 500 MG tablet Take 500 mg by mouth 2 (two) times daily.  08/26/13   [provider]  traMADol (ULTRAM) 50 MG tablet Take 1-2 tablets (50-100 mg total) by mouth every 6 (six) hours as needed for moderate pain. 02/10/20   Lattie Corns, PA-C    Allergies Hydrocodone-acetaminophen, Chloride, Clinoril [sulindac], Diclofenac, Etodolac, Meloxicam, Methotrexate derivatives, Prednisone, Relafen [nabumetone], and Trospium  Family History  Problem Relation Age of Onset  . Breast cancer Sister   . Lung cancer Brother     Social History Social History   Tobacco Use  . Smoking status: Former Smoker    Packs/day: 1.00    Years: 20.00    Pack years: 20.00    Types: Cigarettes  . Smokeless tobacco: Never Used  Vaping Use  . Vaping Use: Never used  Substance Use Topics  . Alcohol use: No  . Drug use: Never    Review of Systems  Constitutional: Negative for fever. Eyes: Negative for visual changes. ENT: Negative for sore throat. Cardiovascular: Negative for chest pain. Respiratory: Negative for shortness of breath. Chest wall pain as above. Gastrointestinal: Negative for abdominal pain, vomiting and diarrhea. Genitourinary: Negative for dysuria. Musculoskeletal: Negative for back pain. Bilateral shoulder pain as  above. Skin: Negative for rash. Skin tear as above. Neurological: Negative for headaches, focal weakness or numbness. ____________________________________________  PHYSICAL EXAM:  VITAL SIGNS: ED Triage Vitals  Enc Vitals Group     BP 05/08/20 1301 (!) 161/88     Pulse Rate 05/08/20 1301 71     Resp 05/08/20 1301 16     Temp 05/08/20 1307 97.9 F (36.6 C)     Temp Source 05/08/20 1307 Oral     SpO2 05/08/20 1301 98 %     Weight 05/08/20 1302 88 lb (39.9 kg)     Height 05/08/20 1302 5\' 3"  (1.6 m)     Head Circumference --      Peak Flow --      Pain Score --      Pain Loc --      Pain Edu? --      Excl. in [place]? --  Constitutional: Alert and oriented. Well appearing and in no distress. A&O Head: Normocephalic and atraumatic. Eyes: Conjunctivae are normal. Normal extraocular movements Neck: Supple. Normal ROM Cardiovascular: Normal rate, regular rhythm. Normal distal pulses. Respiratory: Normal respiratory effort. No wheezes/rales/rhonchi. Mildly tender to palpation of the left lower rib cage in the mid axillary line. No deformity, ecchymosis, bruising, or flail chest is appreciated. Gastrointestinal: Soft and nontender. No distention. Musculoskeletal: No obvious deformity or dislocation to the right or left shoulders. Patient able demonstrate normal active range of motion. Nontender with normal range of motion in all extremities.  Neurologic: Cranial nerves II through XII grossly intact. Normal speech and language. No gross focal neurologic deficits are appreciated. Skin:  Skin is warm, dry and intact. No rash noted. Superficial skin tears noted to the lateral left elbow. Psychiatric: Mood and affect are normal. Patient exhibits appropriate insight and judgment. ____________________________________________   LABS (pertinent positives/negatives) Labs Reviewed  COMPREHENSIVE METABOLIC PANEL - Abnormal; Notable for the following components:      Result Value   Glucose, Bld  104 (*)    All other components within normal limits  CBC - Abnormal; Notable for the following components:   Hemoglobin 11.9 (*)    All other components within normal limits  LIPASE, BLOOD  ____________________________________________   RADIOLOGY  DG Right Shoulder IMPRESSION: 1. No acute fracture or dislocation.  DG Left Ribs w/ CXR, DG Left Shoulder IMPRESSION: 1. Likely nondisplaced fracture of the LEFT lateral eighth rib. Recommend correlation with point tenderness. 2. No pleural effusion or pneumothorax. 3. No acute fracture of the LEFT shoulder. ____________________________________________  PROCEDURES  Ultram 50 mg PO  Procedures ____________________________________________  INITIAL IMPRESSION / ASSESSMENT AND PLAN / ED COURSE  Geriatric patient with evaluation of a mechanical fall at home patient with resides in an assisted living facility, and admittedly got up overnight without the use of her walker. She apparently fell and presents today with her adult daughter. Complaints are primarily to the left chest wall as well as the left and right shoulders. Patient's exam is overall benign return at this time. X-ray imaging reveals a nondisplaced left eighth rib fracture, and no disruption to the shoulders respectively. Her superficial skin tear on the right elbow was cleansed and dressed with nonstick dressing. She is discharged to follow-up with a primary provider for ongoing symptoms. Return precautions have been discussed.    Emma Kennedy was evaluated in Emergency Department on 05/08/2020 for the symptoms described in the history of present illness. She was evaluated in the context of the global COVID-19 pandemic, which necessitated consideration that the patient might be at risk for infection with the SARS-CoV-2 virus that causes COVID-19. Institutional protocols and algorithms that pertain to the evaluation of patients at risk for COVID-19 are in a state of rapid change  based on information released by regulatory bodies including the CDC and federal and state organizations. These policies and algorithms were followed during the patient's care in the ED. ____________________________________________  FINAL CLINICAL IMPRESSION(S) / ED DIAGNOSES  Final diagnoses:  Fall in home, initial encounter  Closed fracture of one rib of left side, initial encounter  Skin tear of elbow without complication, initial encounter      Melvenia Needles, PA-C 05/08/20 2018    Nance Pear, MD 05/08/20 2050

## 2020-05-08 NOTE — ED Notes (Signed)
Daughter declined discharge vital signs.

## 2020-05-08 NOTE — Discharge Instructions (Addendum)
Mrs. Emma Kennedy has a single nondisplaced rib fracture on the left.  This may cause some pain discomfort as she attempts to move, cough, or take deep breaths.  You may apply ice and/or moist warm compresses to help reduce pain.  She should also take a pain medicine as needed.  She has a skin tear to the left elbow which has been dressed with a nonstick Vaseline gauze.  Keep the area clean, dry, and covered with a thin veil of antibiotic ointment.  Follow-up with Dr. Caryl Comes for ongoing symptoms.  Turn to the ED if needed.

## 2020-05-10 DIAGNOSIS — U071 COVID-19: Secondary | ICD-10-CM | POA: Diagnosis not present

## 2020-05-10 DIAGNOSIS — Z20828 Contact with and (suspected) exposure to other viral communicable diseases: Secondary | ICD-10-CM | POA: Diagnosis not present

## 2020-05-13 DIAGNOSIS — F0390 Unspecified dementia without behavioral disturbance: Secondary | ICD-10-CM | POA: Diagnosis not present

## 2020-05-13 DIAGNOSIS — R293 Abnormal posture: Secondary | ICD-10-CM | POA: Diagnosis not present

## 2020-05-13 DIAGNOSIS — S2232XD Fracture of one rib, left side, subsequent encounter for fracture with routine healing: Secondary | ICD-10-CM | POA: Diagnosis not present

## 2020-05-13 DIAGNOSIS — J449 Chronic obstructive pulmonary disease, unspecified: Secondary | ICD-10-CM | POA: Diagnosis not present

## 2020-05-13 DIAGNOSIS — R630 Anorexia: Secondary | ICD-10-CM | POA: Diagnosis not present

## 2020-05-13 DIAGNOSIS — M810 Age-related osteoporosis without current pathological fracture: Secondary | ICD-10-CM | POA: Diagnosis not present

## 2020-05-13 DIAGNOSIS — K5900 Constipation, unspecified: Secondary | ICD-10-CM | POA: Diagnosis not present

## 2020-05-13 DIAGNOSIS — R262 Difficulty in walking, not elsewhere classified: Secondary | ICD-10-CM | POA: Diagnosis not present

## 2020-05-13 DIAGNOSIS — G629 Polyneuropathy, unspecified: Secondary | ICD-10-CM | POA: Diagnosis not present

## 2020-05-13 DIAGNOSIS — R278 Other lack of coordination: Secondary | ICD-10-CM | POA: Diagnosis not present

## 2020-05-17 DIAGNOSIS — R262 Difficulty in walking, not elsewhere classified: Secondary | ICD-10-CM | POA: Diagnosis not present

## 2020-05-17 DIAGNOSIS — R278 Other lack of coordination: Secondary | ICD-10-CM | POA: Diagnosis not present

## 2020-05-18 DIAGNOSIS — U071 COVID-19: Secondary | ICD-10-CM | POA: Diagnosis not present

## 2020-05-18 DIAGNOSIS — Z20828 Contact with and (suspected) exposure to other viral communicable diseases: Secondary | ICD-10-CM | POA: Diagnosis not present

## 2020-05-20 DIAGNOSIS — E038 Other specified hypothyroidism: Secondary | ICD-10-CM | POA: Diagnosis not present

## 2020-05-20 DIAGNOSIS — Z79899 Other long term (current) drug therapy: Secondary | ICD-10-CM | POA: Diagnosis not present

## 2020-05-20 DIAGNOSIS — E559 Vitamin D deficiency, unspecified: Secondary | ICD-10-CM | POA: Diagnosis not present

## 2020-05-20 DIAGNOSIS — E7849 Other hyperlipidemia: Secondary | ICD-10-CM | POA: Diagnosis not present

## 2020-05-20 DIAGNOSIS — D518 Other vitamin B12 deficiency anemias: Secondary | ICD-10-CM | POA: Diagnosis not present

## 2020-05-20 DIAGNOSIS — E119 Type 2 diabetes mellitus without complications: Secondary | ICD-10-CM | POA: Diagnosis not present

## 2020-05-21 DIAGNOSIS — R293 Abnormal posture: Secondary | ICD-10-CM | POA: Diagnosis not present

## 2020-05-21 DIAGNOSIS — S2232XD Fracture of one rib, left side, subsequent encounter for fracture with routine healing: Secondary | ICD-10-CM | POA: Diagnosis not present

## 2020-05-24 DIAGNOSIS — R262 Difficulty in walking, not elsewhere classified: Secondary | ICD-10-CM | POA: Diagnosis not present

## 2020-05-24 DIAGNOSIS — R278 Other lack of coordination: Secondary | ICD-10-CM | POA: Diagnosis not present

## 2020-05-24 DIAGNOSIS — U071 COVID-19: Secondary | ICD-10-CM | POA: Diagnosis not present

## 2020-05-24 DIAGNOSIS — Z20828 Contact with and (suspected) exposure to other viral communicable diseases: Secondary | ICD-10-CM | POA: Diagnosis not present

## 2020-05-27 DIAGNOSIS — R262 Difficulty in walking, not elsewhere classified: Secondary | ICD-10-CM | POA: Diagnosis not present

## 2020-05-27 DIAGNOSIS — R278 Other lack of coordination: Secondary | ICD-10-CM | POA: Diagnosis not present

## 2020-05-31 DIAGNOSIS — R278 Other lack of coordination: Secondary | ICD-10-CM | POA: Diagnosis not present

## 2020-05-31 DIAGNOSIS — Z20828 Contact with and (suspected) exposure to other viral communicable diseases: Secondary | ICD-10-CM | POA: Diagnosis not present

## 2020-05-31 DIAGNOSIS — U071 COVID-19: Secondary | ICD-10-CM | POA: Diagnosis not present

## 2020-05-31 DIAGNOSIS — R262 Difficulty in walking, not elsewhere classified: Secondary | ICD-10-CM | POA: Diagnosis not present

## 2020-06-03 DIAGNOSIS — R278 Other lack of coordination: Secondary | ICD-10-CM | POA: Diagnosis not present

## 2020-06-03 DIAGNOSIS — R262 Difficulty in walking, not elsewhere classified: Secondary | ICD-10-CM | POA: Diagnosis not present

## 2020-06-07 DIAGNOSIS — Z20828 Contact with and (suspected) exposure to other viral communicable diseases: Secondary | ICD-10-CM | POA: Diagnosis not present

## 2020-06-07 DIAGNOSIS — U071 COVID-19: Secondary | ICD-10-CM | POA: Diagnosis not present

## 2020-06-08 DIAGNOSIS — R262 Difficulty in walking, not elsewhere classified: Secondary | ICD-10-CM | POA: Diagnosis not present

## 2020-06-08 DIAGNOSIS — R278 Other lack of coordination: Secondary | ICD-10-CM | POA: Diagnosis not present

## 2020-06-10 DIAGNOSIS — G629 Polyneuropathy, unspecified: Secondary | ICD-10-CM | POA: Diagnosis not present

## 2020-06-10 DIAGNOSIS — J449 Chronic obstructive pulmonary disease, unspecified: Secondary | ICD-10-CM | POA: Diagnosis not present

## 2020-06-10 DIAGNOSIS — H6123 Impacted cerumen, bilateral: Secondary | ICD-10-CM | POA: Diagnosis not present

## 2020-06-10 DIAGNOSIS — E039 Hypothyroidism, unspecified: Secondary | ICD-10-CM | POA: Diagnosis not present

## 2020-06-10 DIAGNOSIS — M1990 Unspecified osteoarthritis, unspecified site: Secondary | ICD-10-CM | POA: Diagnosis not present

## 2020-06-10 DIAGNOSIS — R278 Other lack of coordination: Secondary | ICD-10-CM | POA: Diagnosis not present

## 2020-06-10 DIAGNOSIS — I2510 Atherosclerotic heart disease of native coronary artery without angina pectoris: Secondary | ICD-10-CM | POA: Diagnosis not present

## 2020-06-10 DIAGNOSIS — R262 Difficulty in walking, not elsewhere classified: Secondary | ICD-10-CM | POA: Diagnosis not present

## 2020-06-14 DIAGNOSIS — R262 Difficulty in walking, not elsewhere classified: Secondary | ICD-10-CM | POA: Diagnosis not present

## 2020-06-14 DIAGNOSIS — R278 Other lack of coordination: Secondary | ICD-10-CM | POA: Diagnosis not present

## 2020-06-14 DIAGNOSIS — U071 COVID-19: Secondary | ICD-10-CM | POA: Diagnosis not present

## 2020-06-14 DIAGNOSIS — Z20828 Contact with and (suspected) exposure to other viral communicable diseases: Secondary | ICD-10-CM | POA: Diagnosis not present

## 2020-06-18 DIAGNOSIS — R278 Other lack of coordination: Secondary | ICD-10-CM | POA: Diagnosis not present

## 2020-06-18 DIAGNOSIS — R262 Difficulty in walking, not elsewhere classified: Secondary | ICD-10-CM | POA: Diagnosis not present

## 2020-06-21 DIAGNOSIS — R262 Difficulty in walking, not elsewhere classified: Secondary | ICD-10-CM | POA: Diagnosis not present

## 2020-06-21 DIAGNOSIS — R278 Other lack of coordination: Secondary | ICD-10-CM | POA: Diagnosis not present

## 2020-06-21 DIAGNOSIS — U071 COVID-19: Secondary | ICD-10-CM | POA: Diagnosis not present

## 2020-06-21 DIAGNOSIS — Z20828 Contact with and (suspected) exposure to other viral communicable diseases: Secondary | ICD-10-CM | POA: Diagnosis not present

## 2020-06-28 DIAGNOSIS — R262 Difficulty in walking, not elsewhere classified: Secondary | ICD-10-CM | POA: Diagnosis not present

## 2020-06-28 DIAGNOSIS — S2232XD Fracture of one rib, left side, subsequent encounter for fracture with routine healing: Secondary | ICD-10-CM | POA: Diagnosis not present

## 2020-06-28 DIAGNOSIS — R278 Other lack of coordination: Secondary | ICD-10-CM | POA: Diagnosis not present

## 2020-06-28 DIAGNOSIS — R293 Abnormal posture: Secondary | ICD-10-CM | POA: Diagnosis not present

## 2020-07-05 DIAGNOSIS — U071 COVID-19: Secondary | ICD-10-CM | POA: Diagnosis not present

## 2020-07-05 DIAGNOSIS — Z20828 Contact with and (suspected) exposure to other viral communicable diseases: Secondary | ICD-10-CM | POA: Diagnosis not present

## 2020-07-15 DIAGNOSIS — K5900 Constipation, unspecified: Secondary | ICD-10-CM | POA: Diagnosis not present

## 2020-07-15 DIAGNOSIS — G629 Polyneuropathy, unspecified: Secondary | ICD-10-CM | POA: Diagnosis not present

## 2020-07-15 DIAGNOSIS — K219 Gastro-esophageal reflux disease without esophagitis: Secondary | ICD-10-CM | POA: Diagnosis not present

## 2020-07-15 DIAGNOSIS — J449 Chronic obstructive pulmonary disease, unspecified: Secondary | ICD-10-CM | POA: Diagnosis not present

## 2020-07-15 DIAGNOSIS — M810 Age-related osteoporosis without current pathological fracture: Secondary | ICD-10-CM | POA: Diagnosis not present

## 2020-07-15 DIAGNOSIS — M1990 Unspecified osteoarthritis, unspecified site: Secondary | ICD-10-CM | POA: Diagnosis not present

## 2020-07-15 DIAGNOSIS — E559 Vitamin D deficiency, unspecified: Secondary | ICD-10-CM | POA: Diagnosis not present

## 2020-07-15 DIAGNOSIS — R630 Anorexia: Secondary | ICD-10-CM | POA: Diagnosis not present

## 2020-07-15 DIAGNOSIS — R262 Difficulty in walking, not elsewhere classified: Secondary | ICD-10-CM | POA: Diagnosis not present

## 2020-07-21 DIAGNOSIS — Z7689 Persons encountering health services in other specified circumstances: Secondary | ICD-10-CM | POA: Diagnosis not present

## 2020-07-23 DIAGNOSIS — N19 Unspecified kidney failure: Secondary | ICD-10-CM | POA: Diagnosis not present

## 2020-07-23 DIAGNOSIS — R339 Retention of urine, unspecified: Secondary | ICD-10-CM | POA: Diagnosis not present

## 2020-07-29 DIAGNOSIS — R300 Dysuria: Secondary | ICD-10-CM | POA: Diagnosis not present

## 2020-07-29 DIAGNOSIS — G309 Alzheimer's disease, unspecified: Secondary | ICD-10-CM | POA: Diagnosis not present

## 2020-07-29 DIAGNOSIS — F0150 Vascular dementia without behavioral disturbance: Secondary | ICD-10-CM | POA: Diagnosis not present

## 2020-07-29 DIAGNOSIS — R262 Difficulty in walking, not elsewhere classified: Secondary | ICD-10-CM | POA: Diagnosis not present

## 2020-07-29 DIAGNOSIS — I2510 Atherosclerotic heart disease of native coronary artery without angina pectoris: Secondary | ICD-10-CM | POA: Diagnosis not present

## 2020-07-29 DIAGNOSIS — E538 Deficiency of other specified B group vitamins: Secondary | ICD-10-CM | POA: Diagnosis not present

## 2020-07-29 DIAGNOSIS — M810 Age-related osteoporosis without current pathological fracture: Secondary | ICD-10-CM | POA: Diagnosis not present

## 2020-07-29 DIAGNOSIS — R519 Headache, unspecified: Secondary | ICD-10-CM | POA: Diagnosis not present

## 2020-07-29 DIAGNOSIS — R5383 Other fatigue: Secondary | ICD-10-CM | POA: Diagnosis not present

## 2020-07-29 DIAGNOSIS — M069 Rheumatoid arthritis, unspecified: Secondary | ICD-10-CM | POA: Diagnosis not present

## 2020-07-29 DIAGNOSIS — H5702 Anisocoria: Secondary | ICD-10-CM | POA: Diagnosis not present

## 2020-07-29 DIAGNOSIS — R4182 Altered mental status, unspecified: Secondary | ICD-10-CM | POA: Diagnosis not present

## 2020-07-29 DIAGNOSIS — R531 Weakness: Secondary | ICD-10-CM | POA: Diagnosis not present

## 2020-07-29 DIAGNOSIS — F0280 Dementia in other diseases classified elsewhere without behavioral disturbance: Secondary | ICD-10-CM | POA: Diagnosis not present

## 2020-07-29 DIAGNOSIS — R630 Anorexia: Secondary | ICD-10-CM | POA: Diagnosis not present

## 2020-08-03 DIAGNOSIS — R760 Raised antibody titer: Secondary | ICD-10-CM | POA: Diagnosis not present

## 2020-08-03 DIAGNOSIS — E119 Type 2 diabetes mellitus without complications: Secondary | ICD-10-CM | POA: Diagnosis not present

## 2020-08-03 DIAGNOSIS — E038 Other specified hypothyroidism: Secondary | ICD-10-CM | POA: Diagnosis not present

## 2020-08-03 DIAGNOSIS — Z79899 Other long term (current) drug therapy: Secondary | ICD-10-CM | POA: Diagnosis not present

## 2020-08-03 DIAGNOSIS — D518 Other vitamin B12 deficiency anemias: Secondary | ICD-10-CM | POA: Diagnosis not present

## 2020-08-03 DIAGNOSIS — E7849 Other hyperlipidemia: Secondary | ICD-10-CM | POA: Diagnosis not present

## 2020-08-05 DIAGNOSIS — R5383 Other fatigue: Secondary | ICD-10-CM | POA: Diagnosis not present

## 2020-08-05 DIAGNOSIS — F0390 Unspecified dementia without behavioral disturbance: Secondary | ICD-10-CM | POA: Diagnosis not present

## 2020-08-05 DIAGNOSIS — R519 Headache, unspecified: Secondary | ICD-10-CM | POA: Diagnosis not present

## 2020-08-05 DIAGNOSIS — R531 Weakness: Secondary | ICD-10-CM | POA: Diagnosis not present

## 2020-08-05 DIAGNOSIS — J449 Chronic obstructive pulmonary disease, unspecified: Secondary | ICD-10-CM | POA: Diagnosis not present

## 2020-08-05 DIAGNOSIS — F418 Other specified anxiety disorders: Secondary | ICD-10-CM | POA: Diagnosis not present

## 2020-08-05 DIAGNOSIS — I2510 Atherosclerotic heart disease of native coronary artery without angina pectoris: Secondary | ICD-10-CM | POA: Diagnosis not present

## 2020-08-05 DIAGNOSIS — M810 Age-related osteoporosis without current pathological fracture: Secondary | ICD-10-CM | POA: Diagnosis not present

## 2020-08-05 DIAGNOSIS — K5900 Constipation, unspecified: Secondary | ICD-10-CM | POA: Diagnosis not present

## 2020-08-24 DIAGNOSIS — R262 Difficulty in walking, not elsewhere classified: Secondary | ICD-10-CM | POA: Diagnosis not present

## 2020-08-24 DIAGNOSIS — R278 Other lack of coordination: Secondary | ICD-10-CM | POA: Diagnosis not present

## 2020-08-28 ENCOUNTER — Inpatient Hospital Stay
Admission: EM | Admit: 2020-08-28 | Discharge: 2020-09-29 | DRG: 071 | Disposition: E | Payer: PPO | Source: Skilled Nursing Facility | Attending: Internal Medicine | Admitting: Internal Medicine

## 2020-08-28 ENCOUNTER — Emergency Department: Payer: PPO

## 2020-08-28 ENCOUNTER — Encounter: Payer: Self-pay | Admitting: Internal Medicine

## 2020-08-28 ENCOUNTER — Other Ambulatory Visit: Payer: Self-pay

## 2020-08-28 DIAGNOSIS — Z515 Encounter for palliative care: Secondary | ICD-10-CM | POA: Diagnosis not present

## 2020-08-28 DIAGNOSIS — E860 Dehydration: Secondary | ICD-10-CM | POA: Diagnosis not present

## 2020-08-28 DIAGNOSIS — J849 Interstitial pulmonary disease, unspecified: Secondary | ICD-10-CM | POA: Diagnosis not present

## 2020-08-28 DIAGNOSIS — Z681 Body mass index (BMI) 19 or less, adult: Secondary | ICD-10-CM

## 2020-08-28 DIAGNOSIS — Z66 Do not resuscitate: Secondary | ICD-10-CM | POA: Diagnosis not present

## 2020-08-28 DIAGNOSIS — Z7189 Other specified counseling: Secondary | ICD-10-CM | POA: Diagnosis not present

## 2020-08-28 DIAGNOSIS — G9340 Encephalopathy, unspecified: Secondary | ICD-10-CM | POA: Insufficient documentation

## 2020-08-28 DIAGNOSIS — R4182 Altered mental status, unspecified: Secondary | ICD-10-CM

## 2020-08-28 DIAGNOSIS — G309 Alzheimer's disease, unspecified: Secondary | ICD-10-CM | POA: Diagnosis present

## 2020-08-28 DIAGNOSIS — K219 Gastro-esophageal reflux disease without esophagitis: Secondary | ICD-10-CM | POA: Diagnosis not present

## 2020-08-28 DIAGNOSIS — Y9301 Activity, walking, marching and hiking: Secondary | ICD-10-CM | POA: Diagnosis present

## 2020-08-28 DIAGNOSIS — I444 Left anterior fascicular block: Secondary | ICD-10-CM | POA: Diagnosis present

## 2020-08-28 DIAGNOSIS — K5900 Constipation, unspecified: Secondary | ICD-10-CM | POA: Diagnosis not present

## 2020-08-28 DIAGNOSIS — R9431 Abnormal electrocardiogram [ECG] [EKG]: Secondary | ICD-10-CM | POA: Diagnosis not present

## 2020-08-28 DIAGNOSIS — S2242XA Multiple fractures of ribs, left side, initial encounter for closed fracture: Secondary | ICD-10-CM | POA: Diagnosis not present

## 2020-08-28 DIAGNOSIS — D72829 Elevated white blood cell count, unspecified: Secondary | ICD-10-CM | POA: Diagnosis not present

## 2020-08-28 DIAGNOSIS — M47816 Spondylosis without myelopathy or radiculopathy, lumbar region: Secondary | ICD-10-CM | POA: Diagnosis not present

## 2020-08-28 DIAGNOSIS — G9341 Metabolic encephalopathy: Principal | ICD-10-CM | POA: Diagnosis present

## 2020-08-28 DIAGNOSIS — T1490XA Injury, unspecified, initial encounter: Secondary | ICD-10-CM | POA: Diagnosis not present

## 2020-08-28 DIAGNOSIS — Z803 Family history of malignant neoplasm of breast: Secondary | ICD-10-CM

## 2020-08-28 DIAGNOSIS — R531 Weakness: Secondary | ICD-10-CM

## 2020-08-28 DIAGNOSIS — R0683 Snoring: Secondary | ICD-10-CM | POA: Diagnosis not present

## 2020-08-28 DIAGNOSIS — E871 Hypo-osmolality and hyponatremia: Secondary | ICD-10-CM | POA: Diagnosis not present

## 2020-08-28 DIAGNOSIS — S3991XA Unspecified injury of abdomen, initial encounter: Secondary | ICD-10-CM | POA: Diagnosis not present

## 2020-08-28 DIAGNOSIS — S3993XA Unspecified injury of pelvis, initial encounter: Secondary | ICD-10-CM | POA: Diagnosis not present

## 2020-08-28 DIAGNOSIS — M4319 Spondylolisthesis, multiple sites in spine: Secondary | ICD-10-CM | POA: Diagnosis not present

## 2020-08-28 DIAGNOSIS — Z79890 Hormone replacement therapy (postmenopausal): Secondary | ICD-10-CM | POA: Diagnosis not present

## 2020-08-28 DIAGNOSIS — W1830XA Fall on same level, unspecified, initial encounter: Secondary | ICD-10-CM | POA: Diagnosis present

## 2020-08-28 DIAGNOSIS — I2510 Atherosclerotic heart disease of native coronary artery without angina pectoris: Secondary | ICD-10-CM | POA: Diagnosis not present

## 2020-08-28 DIAGNOSIS — Z043 Encounter for examination and observation following other accident: Secondary | ICD-10-CM | POA: Diagnosis not present

## 2020-08-28 DIAGNOSIS — J8489 Other specified interstitial pulmonary diseases: Secondary | ICD-10-CM | POA: Diagnosis not present

## 2020-08-28 DIAGNOSIS — E861 Hypovolemia: Secondary | ICD-10-CM | POA: Diagnosis present

## 2020-08-28 DIAGNOSIS — E039 Hypothyroidism, unspecified: Secondary | ICD-10-CM | POA: Diagnosis present

## 2020-08-28 DIAGNOSIS — W19XXXA Unspecified fall, initial encounter: Secondary | ICD-10-CM

## 2020-08-28 DIAGNOSIS — Y92129 Unspecified place in nursing home as the place of occurrence of the external cause: Secondary | ICD-10-CM

## 2020-08-28 DIAGNOSIS — Z801 Family history of malignant neoplasm of trachea, bronchus and lung: Secondary | ICD-10-CM

## 2020-08-28 DIAGNOSIS — F0281 Dementia in other diseases classified elsewhere with behavioral disturbance: Secondary | ICD-10-CM | POA: Diagnosis not present

## 2020-08-28 DIAGNOSIS — S22030A Wedge compression fracture of third thoracic vertebra, initial encounter for closed fracture: Secondary | ICD-10-CM | POA: Diagnosis not present

## 2020-08-28 DIAGNOSIS — M069 Rheumatoid arthritis, unspecified: Secondary | ICD-10-CM | POA: Diagnosis not present

## 2020-08-28 DIAGNOSIS — E46 Unspecified protein-calorie malnutrition: Secondary | ICD-10-CM | POA: Diagnosis not present

## 2020-08-28 DIAGNOSIS — R410 Disorientation, unspecified: Secondary | ICD-10-CM | POA: Diagnosis not present

## 2020-08-28 DIAGNOSIS — R404 Transient alteration of awareness: Secondary | ICD-10-CM | POA: Diagnosis not present

## 2020-08-28 DIAGNOSIS — M810 Age-related osteoporosis without current pathological fracture: Secondary | ICD-10-CM | POA: Diagnosis not present

## 2020-08-28 DIAGNOSIS — M4316 Spondylolisthesis, lumbar region: Secondary | ICD-10-CM | POA: Diagnosis not present

## 2020-08-28 DIAGNOSIS — R918 Other nonspecific abnormal finding of lung field: Secondary | ICD-10-CM | POA: Diagnosis not present

## 2020-08-28 DIAGNOSIS — Z87891 Personal history of nicotine dependence: Secondary | ICD-10-CM | POA: Diagnosis not present

## 2020-08-28 DIAGNOSIS — Z20822 Contact with and (suspected) exposure to covid-19: Secondary | ICD-10-CM | POA: Diagnosis present

## 2020-08-28 LAB — CBC
HCT: 37.9 % (ref 36.0–46.0)
Hemoglobin: 12.4 g/dL (ref 12.0–15.0)
MCH: 30.8 pg (ref 26.0–34.0)
MCHC: 32.7 g/dL (ref 30.0–36.0)
MCV: 94.3 fL (ref 80.0–100.0)
Platelets: 371 10*3/uL (ref 150–400)
RBC: 4.02 MIL/uL (ref 3.87–5.11)
RDW: 12.9 % (ref 11.5–15.5)
WBC: 8.8 10*3/uL (ref 4.0–10.5)
nRBC: 0 % (ref 0.0–0.2)

## 2020-08-28 LAB — COMPREHENSIVE METABOLIC PANEL
ALT: 13 U/L (ref 0–44)
AST: 26 U/L (ref 15–41)
Albumin: 3.4 g/dL — ABNORMAL LOW (ref 3.5–5.0)
Alkaline Phosphatase: 84 U/L (ref 38–126)
Anion gap: 9 (ref 5–15)
BUN: 19 mg/dL (ref 8–23)
CO2: 26 mmol/L (ref 22–32)
Calcium: 9.6 mg/dL (ref 8.9–10.3)
Chloride: 97 mmol/L — ABNORMAL LOW (ref 98–111)
Creatinine, Ser: 0.74 mg/dL (ref 0.44–1.00)
GFR, Estimated: >60 mL/min (ref >60)
Glucose, Bld: 135 mg/dL — ABNORMAL HIGH (ref 70–99)
Potassium: 4.3 mmol/L (ref 3.5–5.1)
Sodium: 132 mmol/L — ABNORMAL LOW (ref 135–145)
Total Bilirubin: 0.6 mg/dL (ref 0.3–1.2)
Total Protein: 6.4 g/dL — ABNORMAL LOW (ref 6.5–8.1)

## 2020-08-28 LAB — URINALYSIS, COMPLETE (UACMP) WITH MICROSCOPIC
Bacteria, UA: NONE SEEN
Bilirubin Urine: NEGATIVE
Glucose, UA: NEGATIVE mg/dL
Hgb urine dipstick: NEGATIVE
Ketones, ur: 5 mg/dL — AB
Leukocytes,Ua: NEGATIVE
Nitrite: NEGATIVE
Protein, ur: 30 mg/dL — AB
Specific Gravity, Urine: 1.013 (ref 1.005–1.030)
Squamous Epithelial / HPF: NONE SEEN (ref 0–5)
pH: 6 (ref 5.0–8.0)

## 2020-08-28 LAB — SAMPLE TO BLOOD BANK

## 2020-08-28 LAB — PROTIME-INR
INR: 0.9 (ref 0.8–1.2)
Prothrombin Time: 12.6 seconds (ref 11.4–15.2)

## 2020-08-28 LAB — TROPONIN I (HIGH SENSITIVITY)
Troponin I (High Sensitivity): 10 ng/L (ref <18)
Troponin I (High Sensitivity): 10 ng/L (ref <18)

## 2020-08-28 LAB — APTT: aPTT: 29 seconds (ref 24–36)

## 2020-08-28 LAB — OSMOLALITY: Osmolality: 291 mOsm/kg (ref 275–295)

## 2020-08-28 LAB — MAGNESIUM: Magnesium: 2.2 mg/dL (ref 1.7–2.4)

## 2020-08-28 LAB — CK: Total CK: 102 U/L (ref 38–234)

## 2020-08-28 LAB — RESP PANEL BY RT-PCR (FLU A&B, COVID) ARPGX2
Influenza A by PCR: NEGATIVE
Influenza B by PCR: NEGATIVE
SARS Coronavirus 2 by RT PCR: NEGATIVE

## 2020-08-28 LAB — ETHANOL: Alcohol, Ethyl (B): <10 mg/dL (ref <10)

## 2020-08-28 LAB — TSH: TSH: 1.022 u[IU]/mL (ref 0.350–4.500)

## 2020-08-28 LAB — URIC ACID: Uric Acid, Serum: 3 mg/dL (ref 2.5–7.1)

## 2020-08-28 LAB — LACTIC ACID, PLASMA: Lactic Acid, Venous: 1.8 mmol/L (ref 0.5–1.9)

## 2020-08-28 MED ORDER — ENSURE ENLIVE PO LIQD
237.0000 mL | Freq: Two times a day (BID) | ORAL | Status: DC
  Administered 2020-08-30: 237 mL via ORAL

## 2020-08-28 MED ORDER — MIRABEGRON ER 25 MG PO TB24: 25.0000 mg | ORAL_TABLET | Freq: Every day | ORAL | Status: DC

## 2020-08-28 MED ORDER — POLYETHYLENE GLYCOL 3350 17 G PO PACK
17.0000 g | PACK | Freq: Every day | ORAL | Status: DC
  Administered 2020-08-29: 17 g via ORAL
  Filled 2020-08-28 (×2): qty 1

## 2020-08-28 MED ORDER — MIRTAZAPINE 15 MG PO TABS: 7.5000 mg | ORAL_TABLET | Freq: Every day | ORAL | Status: DC

## 2020-08-28 MED ORDER — MELATONIN 5 MG PO TABS
10.0000 mg | ORAL_TABLET | Freq: Every evening | ORAL | Status: DC
  Administered 2020-08-28 – 2020-08-29 (×2): 10 mg via ORAL
  Filled 2020-08-28 (×2): qty 2

## 2020-08-28 MED ORDER — ENOXAPARIN SODIUM 30 MG/0.3ML ~~LOC~~ SOLN
30.0000 mg | SUBCUTANEOUS | Status: DC
  Filled 2020-08-28: qty 0.3

## 2020-08-28 MED ORDER — POLYVINYL ALCOHOL 1.4 % OP SOLN
1.0000 [drp] | OPHTHALMIC | Status: DC | PRN
  Filled 2020-08-28: qty 15

## 2020-08-28 MED ORDER — DULOXETINE HCL 20 MG PO CPEP: 40.0000 mg | ORAL_CAPSULE | Freq: Every day | ORAL | Status: DC

## 2020-08-28 MED ORDER — HYDROXYCHLOROQUINE SULFATE 200 MG PO TABS
200.0000 mg | ORAL_TABLET | ORAL | Status: DC
  Administered 2020-08-29: 200 mg via ORAL
  Filled 2020-08-28: qty 1

## 2020-08-28 MED ORDER — CYCLOSPORINE 0.05 % OP EMUL
1.0000 [drp] | Freq: Two times a day (BID) | OPHTHALMIC | Status: DC
  Administered 2020-08-28 – 2020-08-31 (×5): 1 [drp] via OPHTHALMIC
  Filled 2020-08-28 (×12): qty 1

## 2020-08-28 MED ORDER — LIDOCAINE 5 % EX PTCH
1.0000 | MEDICATED_PATCH | CUTANEOUS | Status: DC
  Administered 2020-08-28 – 2020-08-29 (×2): 1 via TRANSDERMAL
  Filled 2020-08-28 (×4): qty 1

## 2020-08-28 MED ORDER — SENNOSIDES-DOCUSATE SODIUM 8.6-50 MG PO TABS
1.0000 | ORAL_TABLET | Freq: Every evening | ORAL | Status: DC | PRN
  Administered 2020-09-01: 1 via ORAL

## 2020-08-28 MED ORDER — DOCUSATE SODIUM 100 MG PO CAPS: 100.0000 mg | ORAL_CAPSULE | Freq: Every day | ORAL | Status: DC

## 2020-08-28 MED ORDER — ACETAMINOPHEN 500 MG PO TABS
1000.0000 mg | ORAL_TABLET | Freq: Once | ORAL | Status: AC
  Administered 2020-08-28: 1000 mg via ORAL
  Filled 2020-08-28: qty 2

## 2020-08-28 MED ORDER — POLYETHYL GLYCOL-PROPYL GLYCOL 0.4-0.3 % OP GEL: Freq: Two times a day (BID) | OPHTHALMIC | Status: DC | PRN

## 2020-08-28 MED ORDER — LACTATED RINGERS IV BOLUS
500.0000 mL | Freq: Once | INTRAVENOUS | Status: AC
  Administered 2020-08-28: 500 mL via INTRAVENOUS

## 2020-08-28 MED ORDER — ACETAMINOPHEN 325 MG PO TABS
650.0000 mg | ORAL_TABLET | Freq: Four times a day (QID) | ORAL | Status: DC | PRN
  Administered 2020-08-29: 650 mg via ORAL
  Filled 2020-08-28: qty 2

## 2020-08-28 MED ORDER — LACTATED RINGERS IV SOLN: INTRAVENOUS | Status: DC

## 2020-08-28 MED ORDER — FENTANYL CITRATE (PF) 100 MCG/2ML IJ SOLN
25.0000 ug | Freq: Once | INTRAMUSCULAR | Status: AC
  Administered 2020-08-28: 25 ug via INTRAVENOUS
  Filled 2020-08-28: qty 2

## 2020-08-28 MED ORDER — SULFASALAZINE 500 MG PO TABS
500.0000 mg | ORAL_TABLET | Freq: Two times a day (BID) | ORAL | Status: DC
  Administered 2020-08-29 (×2): 500 mg via ORAL
  Filled 2020-08-28 (×7): qty 1

## 2020-08-28 MED ORDER — ACETAMINOPHEN 650 MG RE SUPP: 650.0000 mg | Freq: Four times a day (QID) | RECTAL | Status: DC | PRN

## 2020-08-28 MED ORDER — IOHEXOL 300 MG/ML  SOLN
75.0000 mL | Freq: Once | INTRAMUSCULAR | Status: AC | PRN
  Administered 2020-08-28: 75 mL via INTRAVENOUS

## 2020-08-28 MED ORDER — LEVOTHYROXINE SODIUM 112 MCG PO TABS
112.0000 ug | ORAL_TABLET | Freq: Every day | ORAL | Status: DC
  Administered 2020-08-29 – 2020-08-30 (×2): 112 ug via ORAL
  Filled 2020-08-28 (×3): qty 1

## 2020-08-28 NOTE — ED Notes (Signed)
Pt cleaned of diarrhea

## 2020-08-28 NOTE — ED Triage Notes (Signed)
Pt arrived via [facility] from [facility] with c/o witnessed fall and AMS. Per EMS, staff saw pt fall and states she has not been acting like herself. Per EMS, staff states pt baseline is usually more verbal and more mobile  Per EMS, possible aspiration. Yellow substance noted around pt mouth.   Per EMS, CBG 127, HR 86, BP 140/60, ETCO2 20-30

## 2020-08-28 NOTE — ED Notes (Signed)
Mits placed on pt's hands as she keeps pulling off pulse ox and nearly pulled out IV about 30 minutes ago. Family member remains at bedside. Pt calm and agreeable currently.

## 2020-08-28 NOTE — H&P (Signed)
History and Physical    PLEASE NOTE THAT [employer]AG[employer] SOFTWARE WAS USED IN THE CONSTRUCTION OF THIS NOTE.   Emma Kennedy [id number] DOB: 20-Nov-1929 DOA: 08/18/2020  PCP: Adin Hector, MD Patient coming from: independent living facility  I have personally briefly reviewed patient's old medical records in [place]  Chief Complaint: Altered mental status  HPI: Emma Kennedy is a 86 y.o. female with medical history significant for dementia, acquired hypothyroidism, rheumatoid arthritis, chronic interstitial lung disease, who is admitted to [facility] on 08/07/2020 with acute metabolic encephalopathy after presenting from independent living facility to [facility] ED for evaluation of altered mental status.   In the setting of patient's acute encephalopathy superimposed on dementia, the following history is provided via my discussions with the patient's daughter, who is present at bedside, in addition to my discussions with the emergency department physician, and via chart review.  Patient is daughter reports that patient was in her normal state of health and baseline mental status is associated with dementia up until approximately 2 weeks ago, at which time the daughter has noted an abrupt patient's mental status.  Specifically, she notes that the patient has been significantly more somnolent and lethargic over the last 2 weeks.  She emphasizes that this does not appear to be cytokinin the patient's level of activity but rather abrupt change in the status.  She specifically notes that the patient has been sleeping 1 to 2 hours longer in the morning relative to her previous overnight sleep requirements, and has also been taking extended naps over that timeframe relative to previous nap duration requirements.  Daughter confirms that the patient has advanced dementia, and that at baseline, she is not oriented to person, place, or time.  Consequently, the daughter is  not noticed any significant change in the patient's level of orientation over the last 2 weeks.  She has however noted patient to exhibit a decline in appetite and therefore intake of food and fluid over the last 2 weeks.  Not associated with any nausea or vomiting.  Daughter is not aware of any changes in the patient's outpatient medication regimen over the last several weeks leading up to her evaluating the abrupt change activity level/somnolence, including new medications, dose adjustments, or subtractions to this regimen.  Additionally, daughter conveys that the patient has exhibited worsening generalized weakness over the last 2 weeks, the independent living facility staff reporting an increase in the patient's degree of assistance with transfers.   Mandie Crabbe conveys the patient experienced earlier today that was witnessed at her independent living facility by the associated staff at that site.  Per independent facility staff, the patient tripped while attempting to ambulate, resulting in a fall forward.  He for hypoalbuminemia to 10 feel that the patient hit her head as a component of this fall, and there was no reported associated loss of consciousness.  Intermittent facility staff also reported a single episode of loose stool earlier today, in the absence of any reported melena or hematochezia.   The patient's, whom I spoke with at patient's bedside, is the patient's POA.  She confirms that the patient is to be DNR/DNI.  Today's CT chest finding of a new 3.6 x 5.6 cm mass in the left upper lobe that is suspicious for malignancy was discussed with the patient's daughter, who clearly conveyed that the patient would not want any additional diagnostic work-up for this potential underlying malignancy, nor would she want any specific  treatment directed at potential malignancy.  Of note, the patient is a former smoker.    ED Course:  Vital signs in the ED were notable for the following: Temperature max  97.5, heart rate 77-84; blood pressure 129/68 133/70; respiratory rate 13-18, oxygen saturation 94 to 97% on room air.   Labs were notable for the following: CMP was notable for the following: Sodium 132.  137 on 05/08/2020 and 138 on 02/09/2020, chloride 97, BUN 19, creatinine 0.74 which is relative to most recent prior value of 0.56 in January 2022, BUN to creatinine ratio 26, glucose 135, calcium of 9.6, which is corrected for hypoalbuminemia at 10.0.  CBC notable for white blood cell count 8800.  Lactic acid 1.8.  Urinalysis notable for showing no white blood cells, no bacteria, nitrate negative, leukocyte esterase negative, was positive for 5 ketones, and showed hyaline casts as well.  Nasopharyngeal COVID-19/influenza PCR performed in the ED today and found to be negative.  EKG showed sinus rhythm with heart rate 78 left anterior fascicular block, QTC 542 ms, and no evidence of T wave or ST changes, including no evidence of ST elevation.  Chest x-ray showed no evidence of acute cardiopulmonary process.  Noncontrast CT of the head showed no evidence of acute intracranial process, including no evidence of intracranial hemorrhage or acute ischemic infarct.  CT cervical spine showed no acute traumatic injury.  CT thoracic spine showed an age-indeterminate T3 compression fracture with 5 mm retropulsion resulting in mild spinal stenosis, but otherwise showed no evidence of acute traumatic injury.  CT of the lumbar spine showed no evidence of acute traumatic injury.  CT chest showed a new 3.6 x 5.6 cm mass in the left upper lobe, suspicious for malignancy as well as showing evidence of chronic interstitial lung disease.  CT abdomen/pelvis showed a large amount of stool in the distal colon and rectum, but otherwise showed no evidence of acute intra-abdominal process.  While in the ED, the following were administered: Lactated Ringer bolus x1 L.  Subsequently, the patient was admitted to the [place] floor for  further evaluation and management of presenting suspected acute metabolic encephalopathy.      Review of Systems: As per HPI otherwise 10 point review of systems negative.   Past Medical History:  Diagnosis Date  . Anemia 2005  . Arthritis   . Diverticulosis   . Headache 2013  . Osteoarthritis   . Osteoporosis   . Thyroid disease     Past Surgical History:  Procedure Laterality Date  . ABDOMINAL HYSTERECTOMY    . APPENDECTOMY    . bilateral foot surgery     . BLADDER SURGERY     bladder tack x 2  . COLON SURGERY  1998   removal of part of colon   . EYE SURGERY    . INTRAMEDULLARY (IM) NAIL INTERTROCHANTERIC Left 02/08/2020   Procedure: INTRAMEDULLARY (IM) NAIL INTERTROCHANTRIC;  Surgeon: Corky Mull, MD;  Location: [facility] ORS;  Service: Orthopedics;  Laterality: Left;  . SACROPLASTY N/A 08/13/2018   Procedure: SACROPLASTY;  Surgeon: Hessie Knows, MD;  Location: [facility] ORS;  Service: Orthopedics;  Laterality: N/A;    Social History:  reports that she has quit smoking. Her smoking use included cigarettes. She has a 20.00 pack-year smoking history. She has never used smokeless tobacco. She reports that she does not drink alcohol and does not use drugs.   Allergies  Allergen Reactions  . Hydrocodone-Acetaminophen Shortness Of Breath, Nausea Only and Other (See  Comments)    Causes severe dizziness  . Clinoril [Sulindac] Other (See Comments)    Stomach pain  . Diclofenac   . Meloxicam Other (See Comments)    unknown  . Methotrexate Derivatives Other (See Comments)    unknown  . Prednisone Swelling    Ok to take low dose for short amount of time.  . Relafen [Nabumetone] Other (See Comments)    unknown  . Trospium Other (See Comments)    unknown    Family History  Problem Relation Age of Onset  . Breast cancer Sister   . Lung cancer Brother     Family history reviewed and not pertinent    Prior to Admission medications   Medication Sig Start Date End Date  Taking? Authorizing Provider  acetaminophen (TYLENOL) 500 MG tablet Take 1,000 mg by mouth every 6 (six) hours. While awake   Yes [provider]  alendronate (FOSAMAX) 70 MG tablet Take 70 mg by mouth once a week. In the morning on an empty stomach. Take with full glass of water. Do not lie down for 30 minutes. 08/10/17  Yes [provider]  aspirin 81 MG EC tablet Take 81 mg by mouth daily.   Yes [provider]  calcium carbonate (OSCAL) 1500 (600 Ca) MG TABS tablet Take 600 mg of elemental calcium by mouth 2 (two) times daily with a meal.   Yes [provider]  Cholecalciferol (VITAMIN D3) 25 MCG (1000 UT) CAPS Take 1,000 Units by mouth daily.    Yes [provider]  diclofenac sodium (VOLTAREN) 1 % GEL Apply 2 g topically 4 (four) times daily. Patient taking differently: Apply 2 g topically 2 (two) times daily. To left hip 05/01/16  Yes Carrie Mew, MD  docusate sodium (COLACE) 100 MG capsule Take 100 mg by mouth daily.   Yes [provider]  DULoxetine (CYMBALTA) 20 MG capsule Take 40 mg by mouth daily. 01/22/20  Yes [provider]  gabapentin (NEURONTIN) 100 MG capsule Take 200 mg by mouth at bedtime. 07/23/13  Yes [provider]  hydroxychloroquine (PLAQUENIL) 200 MG tablet Take 200 mg by mouth every other day. 02/27/18  Yes [provider]  levothyroxine (SYNTHROID, LEVOTHROID) 112 MCG tablet Take 112 mcg by mouth daily before breakfast.  08/11/13  Yes [provider]  mirtazapine (REMERON) 15 MG tablet Take 7.5 mg by mouth at bedtime.   Yes [provider]  omeprazole (PRILOSEC) 20 MG capsule Take 20 mg by mouth daily.   Yes [provider]  polyethylene glycol (MIRALAX / GLYCOLAX) 17 g packet Take 1 Container by mouth every other day. 08/15/13  Yes [provider]  RESTASIS 0.05 % ophthalmic emulsion Place 1 drop into both eyes 2 (two) times daily. 07/30/20  Yes [provider]  sulfaSALAzine (AZULFIDINE) 500 MG tablet Take 500 mg by mouth 2 (two) times daily.  08/26/13  Yes [provider]  acetaminophen (TYLENOL) 325 MG tablet Take 2 tablets (650 mg total) by mouth every 6 (six) hours as needed. Patient not taking: No sig reported 05/01/16   Carrie Mew, MD  Cyanocobalamin (VITAMIN B-12 PO) Take 5,000 mcg by mouth daily. Patient not taking: Reported on 08/19/2020    [provider]  donepezil (ARICEPT) 10 MG tablet Take 10 mg by mouth at bedtime. Patient not taking: Reported on 08/02/2020 01/22/20   [provider]  enoxaparin (LOVENOX) 30 MG/0.3ML injection Inject 0.3 mLs (30 mg total) into the skin  daily. Patient not taking: Reported on 09-13-20 02/11/20   Lattie Corns, PA-C  feeding supplement (ENSURE ENLIVE / ENSURE PLUS) LIQD Take 237 mLs by mouth 2 (two) times daily between meals. 02/12/20   Wouk, Ailene Rud, MD  ferrous sulfate 325 (65 FE) MG EC tablet Take 1 tablet (325 mg total) by mouth 2 (two) times daily. Patient not taking: Reported on 09-13-20 02/12/20 02/11/21  Gwynne Edinger, MD  Melatonin 10 MG TABS Take 10 mg by mouth. Patient not taking: Reported on 2020/09/13    [provider]  MYRBETRIQ 25 MG TB24 tablet Take 25 mg by mouth daily. Patient not taking: Reported on 2020-09-13 01/22/20   [provider]  Polyethyl Glycol-Propyl Glycol (SYSTANE OP) Place 1 drop into both eyes 2 (two) times daily as needed (dry eyes).  Patient not taking: Reported on 09-13-2020    [provider]  senna-docusate (SENOKOT-S) 8.6-50 MG tablet Take 1 tablet by mouth at bedtime as needed for mild constipation. Patient not taking: Reported on 13-Sep-2020 02/12/20   Wouk, Ailene Rud, MD  traMADol (ULTRAM) 50 MG tablet Take 1-2 tablets (50-100 mg total) by mouth every 6 (six) hours as needed for moderate pain. Patient not taking: Reported on 2020/09/13 02/10/20   Lattie Corns, PA-C      Objective    Physical Exam: Vitals:   09-13-2020 1030 13-Sep-2020 1110 Sep 13, 2020 1125 2020/09/13 1445  BP: 131/71  133/69   Pulse: 82  84 85  Resp: 11  18 16   Temp:  (!) 97.5 F (36.4 C)    TempSrc:  Oral    SpO2: 94%  93% 96%  Weight:      Height:        General: appears to be stated age; alert; no oriented to person, place, or time (although daughter confirms that this represents the patient's baseline level of orientation) Skin: warm, dry, no rash Head:  AT/[place] Mouth:  Oral mucosa membranes appear dry, normal dentition Neck: supple; trachea midline Heart:  RRR; did not appreciate any M/R/G Lungs: CTAB, did not appreciate any wheezes, rales, or rhonchi Abdomen: + BS; soft, ND, NT Vascular: 2+ pedal pulses b/l; 2+ radial pulses b/l Extremities: no peripheral edema, no muscle wasting Neuro: strength and sensation intact in upper and lower extremities b/l    Labs on Admission: I have personally reviewed following labs and imaging studies  CBC: Recent Labs  Lab 09-13-20 0917  WBC 8.8  HGB 12.4  HCT 37.9  MCV 94.3  PLT 160   Basic Metabolic Panel: Recent Labs  Lab 2020/09/13 0917  NA 132*  K 4.3  CL 97*  CO2 26  GLUCOSE 135*  BUN 19  CREATININE 0.74  CALCIUM 9.6  MG 2.2   GFR: Estimated Creatinine Clearance: 28.6 mL/min (by C-G formula based on SCr of 0.74 mg/dL). Liver Function Tests: Recent Labs  Lab 09-13-2020 0917  AST 26  ALT 13  ALKPHOS 84  BILITOT 0.6  PROT 6.4*  ALBUMIN 3.4*   No results for input(s): LIPASE, AMYLASE in the last 168 hours. No results for input(s): AMMONIA in the last 168 hours. Coagulation Profile: Recent Labs  Lab 09-13-20 0917  INR 0.9   Cardiac Enzymes: No results for input(s): CKTOTAL, CKMB, CKMBINDEX, TROPONINI in the last 168 hours. BNP (last 3 results) No results for input(s): PROBNP in the last 8760 hours. HbA1C: No results for input(s): HGBA1C in the last 72 hours. CBG: No results for input(s): GLUCAP in  the last 168 hours. Lipid Profile: No results for input(s): CHOL, HDL, LDLCALC, TRIG, CHOLHDL, LDLDIRECT in the last 72 hours. Thyroid Function Tests: No results for input(s): TSH, T4TOTAL, FREET4, T3FREE, THYROIDAB in the last 72 hours. Anemia Panel: No results for input(s): VITAMINB12, FOLATE, FERRITIN, TIBC, IRON, RETICCTPCT in the last 72 hours. Urine analysis:    Component Value Date/Time   COLORURINE AMBER (A) 08/11/2020 0928   APPEARANCEUR CLEAR (A) 08/20/2020 0928   LABSPEC 1.013 08/12/2020 0928   PHURINE 6.0 08/03/2020 0928   GLUCOSEU NEGATIVE 08/06/2020 0928   HGBUR NEGATIVE 08/01/2020 0928   BILIRUBINUR NEGATIVE 08/07/2020 0928   KETONESUR 5 (A) 07/31/2020 0928   PROTEINUR 30 (A) 08/04/2020 0928   NITRITE NEGATIVE 08/01/2020 0928   LEUKOCYTESUR NEGATIVE 08/17/2020 0928    Radiological Exams on Admission: CT Head Wo Contrast  Result Date: 08/16/2020 CLINICAL DATA:  87 year old female status post fall. EXAM: CT HEAD WITHOUT CONTRAST TECHNIQUE: Contiguous axial images were obtained from the base of the skull through the vertex without intravenous contrast. COMPARISON:  Brain MRI 02/07/2020.  Head CT 02/07/2020. FINDINGS: Brain: Cerebral volume not significantly changed. No midline shift, ventriculomegaly, mass effect, evidence of mass lesion, intracranial hemorrhage or evidence of cortically based acute infarction. Patchy and confluent bilateral white matter hypodensity appears stable since last year. No cortical encephalomalacia identified. Vascular: Advanced Calcified atherosclerosis at the skull base. No suspicious intracranial vascular hyperdensity. Skull: Stable, intact. Sinuses/Orbits: Visualized paranasal sinuses and mastoids are stable and well aerated. Other: No orbit or scalp soft tissue injury identified today. IMPRESSION: Stable. No acute intracranial abnormality or acute traumatic injury identified. Electronically Signed   By: Genevie Ann M.D.   On: 08/11/2020 11:56   CT  Cervical Spine Wo Contrast  Result Date: 08/01/2020 CLINICAL DATA:  87 year old female status post fall. EXAM: CT CERVICAL SPINE WITHOUT CONTRAST TECHNIQUE: Multidetector CT imaging of the cervical spine was performed without intravenous contrast. Multiplanar CT image reconstructions were also generated. COMPARISON:  CT cervical spine 02/07/2020. Head CT today reported separately. FINDINGS: Alignment: Preserved cervical lordosis. Cervicothoracic junction alignment is within normal limits. Bilateral posterior element alignment is within normal limits. Skull base and vertebrae: Osteopenia. Visualized skull base is intact. No atlanto-occipital dissociation. Preserved C1-C2 alignment. No acute osseous abnormality identified. Soft tissues and spinal canal: No prevertebral fluid or swelling. No visible canal hematoma. Stable and negative noncontrast visible neck soft tissues. Disc levels: Chronically ankylosed right side C4-C5 facets. Widespread chronic cervical disc calcification. But otherwise mild for age cervical spine degeneration. No significant spinal stenosis suspected. Upper chest: Reported separately today. IMPRESSION: 1. No acute traumatic injury identified in the cervical spine. Mild for age cervical degeneration. 2. See Thoracic Spine CT today reported separately. Electronically Signed   By: Genevie Ann M.D.   On: 08/06/2020 11:59   CT CHEST ABDOMEN PELVIS W CONTRAST  Result Date: 07/31/2020 CLINICAL DATA:  87 year old female with fall and chest, abdominal and pelvic pain. Altered mental status. EXAM: CT CHEST, ABDOMEN, AND PELVIS WITH CONTRAST TECHNIQUE: Multidetector CT imaging of the chest, abdomen and pelvis was performed following the standard protocol during bolus administration of intravenous contrast. CONTRAST:  80mL OMNIPAQUE IOHEXOL 300 MG/ML  SOLN COMPARISON:  04/28/2015 high-resolution chest CT and 10/24/2011 abdominal/pelvic CT FINDINGS: CT CHEST FINDINGS Cardiovascular: Normal heart size. Heavy  coronary artery atherosclerotic calcifications are again noted. Thoracic aortic atherosclerotic calcifications noted without aneurysm. No pericardial effusion. Mediastinum/Nodes: No enlarged mediastinal, hilar, or axillary lymph nodes. Thyroid gland, trachea, and esophagus demonstrate  no significant findings. Lungs/Pleura: New 3.6 x 5.6 cm consolidation/mass within the anterior mid LEFT UPPER lobe noted with heterogeneous enhancement. A trace LEFT pleural effusion is noted. Chronic interstitial changes/interlobular septal thickening with mild biapical pleuroparenchymal scarring again noted. LEFT hemithorax volume loss is unchanged. No pneumothorax. Musculoskeletal: Severe T3 compression fracture with vertebra plana and 5 mm retropulsion of the posteroinferior endplate is age indeterminate. Fractures of the posterior LEFT 6th and 7th ribs appear subacute. CT ABDOMEN PELVIS FINDINGS Hepatobiliary: The liver and gallbladder are unremarkable. Minimal fullness of the CBD/intrahepatic biliary system noted. Pancreas: Unremarkable Spleen: No significant abnormality Adrenals/Urinary Tract: No acute renal abnormalities. Small areas of renal scarring again identified. Adrenal glands and bladder are within normal limits. Stomach/Bowel: A large amount of stool noted within the distal colon and rectum. No dilated small bowel loops are identified. No definite bowel wall thickening or inflammatory changes are noted. Vascular/Lymphatic: Aortic atherosclerosis. No enlarged abdominal or pelvic lymph nodes. Reproductive: Status post hysterectomy. No adnexal masses. Other: No ascites, focal collection or pneumoperitoneum. Musculoskeletal: No acute or suspicious bony abnormalities are noted. Multilevel degenerative changes within the lumbar spine and L5-S1 spondylolisthesis again noted. IMPRESSION: 1. New 3.6 x 5.6 cm consolidation/mass within the anterior mid LEFT UPPER lobe - suspicious for malignancy, with infection less likely. Trace  LEFT pleural effusion. 2. Severe T3 compression fracture with vertebra plana and 5 mm retropulsion of the posteroinferior endplate, age indeterminate. 3. Fractures of the posterior LEFT 6th and 7th ribs - appear subacute. 4. No evidence of acute injury within the abdomen or pelvis. 5. Large amount of stool within the distal colon and rectum. 6. Chronic interstitial lung disease. 7. Coronary artery disease 8. Aortic atherosclerosis (ICD10-I70.0). Electronically Signed   By: Margarette Canada M.D.   On: 08/05/2020 12:32   CT T-SPINE NO CHARGE  Result Date: 08/22/2020 CLINICAL DATA:  87 year old female status post fall. EXAM: CT THORACIC SPINE WITH CONTRAST TECHNIQUE: Multiplanar CT images of the thoracic spine were reconstructed from contemporary CT of the Chest. CONTRAST:  No additional COMPARISON:  Cervical spine CT and CT Chest, Abdomen, and Pelvis today are reported separately. Chest CT 04/28/2015. Chest and rib series 05/08/2020. FINDINGS: Limited cervical spine imaging: Reported separately today. Cervicothoracic junction alignment is within normal limits. Thoracic spine segmentation:  Normal. Alignment: Abnormally exaggerated upper thoracic kyphosis related to the T3 finding. Relatively normal thoracic vertebral height and alignment elsewhere. Vertebrae: Osteopenia. Severe T3 compression fracture with vertebra plana (series 5, image 27). This is new since 2016, unclear whether this was present in January. The compressed vertebral body is sclerotic. Retropulsion of the posteroinferior endplate up to 5 mm, although the bony AP spinal canal remains about 8 mm none the less (series 6, image 81). T3 pedicles and posterior elements appear to remain intact with underlying chronic T3-T4 facet ankylosis which was present in 2016. T1 and T2 appear stable and intact. T4 through T12 appear stable and intact. a un healed posterior 7th rib fracture is new since 2016. Although there does appear to be some new periosteal bone  formation there. Similar appearance of the posterior left 6th and 7th ribs at the costovertebral junctions. No other posterior rib fracture identified. Paraspinal and other soft tissues: Thoracic paraspinal soft tissues remain within normal limits. Chest and abdomen are reported separately today. Disc levels: Mild for age thoracic spine degeneration, with no CT evidence of significant spinal stenosis outside of the T3 level described above. IMPRESSION: 1. Severe T3 compression fracture with vertebra plana  and 5 mm retropulsion of the posteroinferior endplate resulting in mild spinal stenosis. This is age indeterminate. 2. No other acute traumatic injury identified in the thoracic spine. 3. Subacute appearing comminuted fracture of the left posterior 7th rib, and the left 6th and 7th rib costovertebral junctions. 4.  CT Chest, Abdomen, and Pelvis today are reported separately. Electronically Signed   By: Genevie Ann M.D.   On: 08/20/2020 12:08   CT L-SPINE NO CHARGE  Result Date: 08/07/2020 CLINICAL DATA:  87 year old female status post fall. EXAM: CT LUMBAR SPINE WITH CONTRAST TECHNIQUE: Technique: Multiplanar CT images of the lumbar spine were reconstructed from contemporary CT of the Abdomen and Pelvis. CONTRAST:  No additional COMPARISON:  Thoracic spine CT today and CT Chest, Abdomen, and Pelvis today are reported separately. MRI lumbar spine 08/08/2018. FINDINGS: Segmentation: Normal, concordant with the thoracic spine numbering today. Alignment: Chronic levoconvex thoracic scoliosis and spondylolisthesis at L4-L5 (grade 1 and L5-S1 (grade 2) appears stable since 2020. Vertebrae: Diffuse osteopenia. Visible lower thoracic levels and lumbar vertebrae appear intact. Mild chronic endplate deformity inferiorly at L5 is stable. Previous sacroplasty. No recurrent sacral fracture is evident. Paraspinal and other soft tissues: Abdomen and pelvis are reported separately today. Lumbar paraspinal soft tissues remain within  normal limits. Disc levels: Stable lumbar spine degeneration since 2020, with only mild lower lumbar spinal stenosis despite the chronic spondylolisthesis. IMPRESSION: 1. No acute traumatic injury identified in the lumbar spine. Lumbar spine spondylolisthesis and degeneration appears stable from a 2020 MRI. 2.  CT Chest, Abdomen, and Pelvis today are reported separately. Electronically Signed   By: Genevie Ann M.D.   On: 08/09/2020 12:11   DG Chest Port 1 View  Result Date: 08/12/2020 CLINICAL DATA:  87 year old female status post fall. Altered mental status. EXAM: PORTABLE CHEST 1 VIEW COMPARISON:  Chest radiographs 05/08/2020 and earlier. FINDINGS: Portable AP upright view at 0905 hours. Chronic left lung volume loss and architectural distortion. Stable cardiac size and mediastinal contours. Calcified aortic atherosclerosis. Visualized tracheal air column is within normal limits. The right lung remains clear when allowing for portable technique. Degenerative osseous changes at the [facility] joints. No acute osseous abnormality identified. Negative visible bowel gas pattern. IMPRESSION: No acute cardiopulmonary abnormality. Stable chronic left lung disease Electronically Signed   By: Genevie Ann M.D.   On: 08/17/2020 09:38     EKG: Independently reviewed, with result as described above.    Assessment/Plan   ARAMINTA ZORN is a 87 y.o. female with medical history significant for dementia, acquired hypothyroidism, rheumatoid arthritis, chronic interstitial lung disease, who is admitted to [facility] on 08/21/2020 with acute metabolic encephalopathy after presenting from independent living facility to [facility] ED for evaluation of altered mental status.    Principal Problem:   Acute metabolic encephalopathy Active Problems:   RA (rheumatoid arthritis) (HCC)   Hypothyroidism   Generalized weakness   Acute hyponatremia   Dehydration   Prolonged QT interval    #) Acute metabolic  encephalopathy: worsening somnolence/lethargy that reportedly started abruptly 2 weeks ago as opposed to representing progressive decline per the daughters report.  No apparent change the patient's orientation, etc. reports she has been in her baseline in the setting of advanced dementia.  As the patient lives in an independent living facility, her functional status over the last 2 weeks appears to warrant a higher level of care.  The patient's daughter wishes to determine if there are any reversible causes for this reportedly abrupt  worsening of mental status over the last 2 weeks, or any other information that would suggest a new baseline, that would warrant evaluation for a higher level of care, namely placement in a SNF.  She conveys that elucidation of disposition planning is a major goal over this hospital course.   At this time, there are potentially several contributing factors leading to presenting acute metabolic encephalopathy superimposed on dementia.  These potential contributing factors include suspected dehydration, as further described below versus development of acute hyponatremia, as further quantified and discussed below, as well as potential pharmacologic influences from several central acting medications taken as an outpatient, including Cymbalta, mirtazapine, and gabapentin.  Her gabapentin is of particular note given that is 100% cleared renally, which is notable in the context of interval worsening of renal function that does not meet quantitative threshold for AKI criteria, thereby creating a physiologic situation in which the patient is been experiencing higher circulating gabapentin levels relative to that present during the first week of January purely based upon relative renal function.  Consequently, will hold gabapentin for now, with further evaluation management renal function, as below.  Additionally, in the setting of concomitant presenting prolonged QTC interval, will hold home  Cymbalta for now, as this medication could exacerbate this finding.  Presentation appears less suggestive of acute ischemic CVA, and CT head performed today showed no evidence of acute intracranial process.   No overt evidence of underlying infectious process, including COVID-19 PCR performed in the ED today, ultrasound to be negative.  Additionally, urinalysis is not suggestive of underlying UTI, while chest x-ray showed no evidence of acute cardiopulmonary process, including no evidence of pneumonia.  The patient's single episode of reported diarrhea appears more consistent with passage of stool around a significant element of constipation based on findings of ensuing CT abdomen/pelvis.  Consequently, enteric infectious process is felt to be less likely at this time.    Plan: Gentle IV fluids, as further described below.  Evaluation and management of acute hypoosmolar hyponatremia, as further described below.  Hold home central acting medications, including Cymbalta, mirtazapine, gabapentin.  Check VBG for any contribution from hypercapnic encephalopathy.  Check TSH, particularly in the setting of a documented history of acquired hypothyroidism.  Also check MMA level, CPK, urinary drug screen.  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  Repeat CMP in the morning as well as CBC at that time.  Fall precautions.      #) Generalized weakness: Daughter reports that the last 2 weeks of abrupt onset worsening somnolence/lethargy associated with generalized weakness, during which time the patient has required increasing degrees of assistance with transfers, raising the possibility that she may require a higher level of care if this degree of generalized weakness is subsequently deemed to represent the patient's new baseline.  No evidence of acute focal neurologic deficits,  including no evidence of acute focal weakness.  As stated above, acute ischemic CVA is felt to be less likely at this time.  Potential factors contributing to her recent generalized weakness including dehydration, hyponatremia, and the above discussion of multiple outpatient central acting medications in the setting of interval relative decline in renal function.  Plan: IV fluids, as above.  Repeat Occupational Therapy consults have been placed.  Fall precautions.  Hold home central acting medications, as above.  Check TSH, MMA.  Further evaluation management of presenting hyponatremia, as above.      #)  Acute hypo-osmolar hyponatremia: Presenting serum sodium found to be 132, relative to most recent prior values of 137 in January 2022 as well as 138 in October 2021.  Patient feeling Etiology, with suspected contribution from dehydration in the setting of clinical evidence of such as well as report of recent decline in oral intake coinciding with the timeframe of decline of the serum sodium levels.  Differential also includes possibility of SIADH, particular in the setting of today CT chest showing evidence of a new mass in the left upper lobe, suspicious for malignancy, with a mass in general increasing likelihood for SIADH given pulmonary distribution, as well as additional possibility of hyponatremia on the basis of paraneoplastic syndrome in context of a long prior smoking history.  We will also check TSH, particular in the setting of a documented history of acquired hypothyroidism on Synthroid supplementation at home.  In general, will provide gentle IV fluids to attempt to suspect an element of contributory dehydration, while further evaluating for any additional contributing factors, including SIADH, as further detailed below.  Potential pharmacologic contributions include that from Cymbalta.    Plan: monitor strict I's and O's and daily weights.  random urine sodium, urine osmolality.  Check  serum osmolality to confirm suspected hypoosmolar etiology.  Check serum uric acid level, as SIADH can be associated with hypouricemia due to hyperuricuria.  Repeat BMP in the morning.  Check TSH.  Holding home Cymbalta, as above.  Gentle IV fluids with lactated Ringer's running at 50 cc/h x 12 hours.       #) Dehydration: Clinical suspicion for such given presence of dry oral mucous membranes as well as laboratory findings suggestive of dehydration, including evidence of prerenal azotemia as well as the presence of hyaline casts on urinalysis.  Appears to be in the setting of reported recent decline in oral intake.  No evidence of associated hypotension.  Plan: Gentle IV fluids in the form of lactated Ringer's at 50 cc/h x 12 hours, as above.  Monitor strict I's and O's Daily weights.  Repeat BMP in the morning.      #) Prolonged QTc interval: Presenting EKG notable for QTC of 542 ms.  Outpatient medications that can be associated with further prolonging this interval include Cymbalta.  We will continue home Plaquenil for now, with plan for repeat EKG in the morning to trend interval duration and QTc interval, including monitoring for slight interval decline QTc interval in the setting of holding Cymbalta.  Plan: Hold home Cymbalta.  Repeat EKG in the morning.  Add on serum magnesium level, with as needed supplementation to maintain levels of greater than or equal to 2.0.     #) Left upper lobe lung mass: New finding of 3.6 x 5.6 cm mass in the left upper lobe suspicious for malignancy.  The patient's daughter, who is the POA, clearly conveyed to me that the patient would not want histopathological identification of this mass, nor would she want any life prolonging her life preserving interventions as it relates to suspected malignancy.  Plan: Supportive measures as it relates that this newly found left upper lobe lung mass, per her wishes conveyed acutely, as above.       #) Acquired  hypothyroidism: On Synthroid as an outpatient.  Plan: In the setting of presenting acute encephalopathy as well as generalized weakness in addition to acute hypoosmolar hyponatremia, will check TSH.  For now, continue home dose of Synthroid.      #) Rheumatoid arthritis: On  sulfasalazine and Plaquenil as an outpatient.  Suspect this autoimmune disease is the source of her noted chronic interstitial lung disease.  Plan: Continue home Plaquenil and sulfasalazine.  Repeat EKG in the morning, with close attention to interval QTC duration in the setting of Plaquenil use.      #) On omeprazole as an outpatient.  Given the PPIs can be associated with contributing to the altered mental status, will hold home PPI for now, with ultimate consideration for transition to H2 blocker given lower risk for mental status changes as well as diminish risk for osteoporosis with this potential change.   Plan: Hold home omeprazole for now, as above.     DVT prophylaxis: SCDs Code Status: The patient's daughter, who is her POA, clearly conveys to me that the patient is to be DNR/DNI Family Communication: The patient's case was discussed with her daughter (POA), who was present at bedside. Disposition Plan: Per Rounding Team Consults called: none  Admission status: Inpatient; MedSurg     Of note, this patient was added by me to the following Admit List/Treatment Team: [facility].      PLEASE NOTE THAT DRAGON DICTATION SOFTWARE WAS USED IN THE CONSTRUCTION OF THIS NOTE.   [facility] Pager [phone] From 12PM - 12AM  Otherwise, please contact night-coverage  [email or web address] Password [email or web address]   08/05/2020, 3:02 PM

## 2020-08-28 NOTE — ED Notes (Signed)
Pt did not eat at all from dinner tray per family; tray sitting at bedside table full of untouched food. Family states pt has had a decreased appetite lately. Pt remains calm and cooperative. Mits remain in use as they have been helpful to pt.

## 2020-08-28 NOTE — ED Notes (Signed)
Called PT without answer- left message notifying of room 26 PT consult.

## 2020-08-28 NOTE — ED Notes (Signed)
Pt to CT at this time.

## 2020-08-28 NOTE — ED Notes (Signed)
This tech and Thedore Mins EDT performed inc/peri care. x2 inc liquid episode. Dry brief applied. Linen change perform.

## 2020-08-28 NOTE — ED Notes (Signed)
Purewick applied. Pt repositioned in bed. Pt currently dry. Pt urinated 200cc of amber urine and c/o burning sensation when she started urinating. Visitor remains at bedside. Bed locked low. Rail up. Call bell within reach. Pt alert and calm. Family reports pt dx with dementia about a year ago.

## 2020-08-28 NOTE — ED Notes (Signed)
Family member at bedside reports pt doing much better since mits applied to hands.

## 2020-08-28 NOTE — ED Provider Notes (Signed)
[facility] Emergency Department Provider Note  ____________________________________________   Event Date/Time   First MD Initiated Contact with Patient 08/26/2020 [phone]     (approximate)  I have reviewed the triage vital signs and the nursing notes.   HISTORY  Chief Complaint Fall and Altered Mental Status   HPI Emma Kennedy is a 87 y.o. female with a past medical history of dementia, anemia, hypothyroidism, HDL, anxiety, neuropathy, RA and previously known left femur fracture who presents EMS from nursing facility after she had a witnessed fall.  Per staff at facility patient has been a little more confused than usual the last couple weeks but was coming out of bathroom earlier today assisted by a nursing aide when her legs gave out under her and she fell.  They are not sure if she hit her head or had LOC.  She has not any blood thinners.  Patient is unable provide any history secondary to altered mental status.  Daughter did arrive at bedside shortly after patient arrived and stated patient had had a slow cognitive decline over the last couple weeks.  She confirmed patient's CODE STATUS is DNR.  She was not present for fall today.  She does confirm the patient has history of dementia but is typically able to carry conversation and ambulate without assistance although the last 2 or 3 weeks seemed much more slowed unable to do things without significant assistant prompting.         Past Medical History:  Diagnosis Date  . Anemia 2005  . Arthritis   . Diverticulosis   . Headache 2013  . Osteoarthritis   . Osteoporosis   . Thyroid disease     Patient Active Problem List   Diagnosis Date Noted  . Acute encephalopathy 08/04/2020  . Scalp laceration   . Closed left hip fracture ([place]) 02/07/2020  . Syncope 02/07/2020  . Dementia without behavioral disturbance ([place]) 02/07/2020  . RA (rheumatoid arthritis) ([place])   . Normocytic anemia   . Hypothyroidism    . Fall     Past Surgical History:  Procedure Laterality Date  . ABDOMINAL HYSTERECTOMY    . APPENDECTOMY    . bilateral foot surgery     . BLADDER SURGERY     bladder tack x 2  . COLON SURGERY  1998   removal of part of colon   . EYE SURGERY    . INTRAMEDULLARY (IM) NAIL INTERTROCHANTERIC Left 02/08/2020   Procedure: INTRAMEDULLARY (IM) NAIL INTERTROCHANTRIC;  Surgeon: Corky Mull, MD;  Location: [facility] ORS;  Service: Orthopedics;  Laterality: Left;  . SACROPLASTY N/A 08/13/2018   Procedure: SACROPLASTY;  Surgeon: Hessie Knows, MD;  Location: [facility] ORS;  Service: Orthopedics;  Laterality: N/A;    Prior to Admission medications   Medication Sig Start Date End Date Taking? Authorizing Provider  acetaminophen (TYLENOL) 500 MG tablet Take 1,000 mg by mouth every 6 (six) hours. While awake   Yes [provider]  alendronate (FOSAMAX) 70 MG tablet Take 70 mg by mouth once a week. In the morning on an empty stomach. Take with full glass of water. Do not lie down for 30 minutes. 08/10/17  Yes [provider]  aspirin 81 MG EC tablet Take 81 mg by mouth daily.   Yes [provider]  calcium carbonate (OSCAL) 1500 (600 Ca) MG TABS tablet Take 600 mg of elemental calcium by mouth 2 (two) times daily with a meal.   Yes [provider]  diclofenac sodium (VOLTAREN) 1 % GEL Apply 2 g topically 4 (four) times daily. Patient taking differently: Apply 2 g topically 2 (two) times daily. To left hip 05/01/16  Yes Carrie Mew, MD  docusate sodium (COLACE) 100 MG capsule Take 100 mg by mouth daily.   Yes [provider]  DULoxetine (CYMBALTA) 20 MG capsule Take 40 mg by mouth daily. 01/22/20  Yes [provider]  gabapentin (NEURONTIN) 100 MG capsule Take 200 mg by mouth at bedtime. 07/23/13  Yes [provider]  hydroxychloroquine (PLAQUENIL) 200 MG tablet Take 200 mg by mouth every other day. 02/27/18  Yes [provider]   levothyroxine (SYNTHROID, LEVOTHROID) 112 MCG tablet Take 112 mcg by mouth daily before breakfast.  08/11/13  Yes [provider]  mirtazapine (REMERON) 15 MG tablet Take 7.5 mg by mouth at bedtime.   Yes [provider]  omeprazole (PRILOSEC) 20 MG capsule Take 20 mg by mouth daily.   Yes [provider]  polyethylene glycol (MIRALAX / GLYCOLAX) 17 g packet Take 1 Container by mouth every other day. 08/15/13  Yes [provider]  RESTASIS 0.05 % ophthalmic emulsion Place 1 drop into both eyes 2 (two) times daily. 07/30/20  Yes [provider]  acetaminophen (TYLENOL) 325 MG tablet Take 2 tablets (650 mg total) by mouth every 6 (six) hours as needed. Patient not taking: No sig reported 05/01/16   Carrie Mew, MD  Cholecalciferol (VITAMIN D3) 25 MCG (1000 UT) CAPS Take 1,000 Units by mouth daily.     [provider]  Cyanocobalamin (VITAMIN B-12 PO) Take 5,000 mcg by mouth daily.    [provider]  donepezil (ARICEPT) 10 MG tablet Take 10 mg by mouth at bedtime. 01/22/20   [provider]  enoxaparin (LOVENOX) 30 MG/0.3ML injection Inject 0.3 mLs (30 mg total) into the skin daily. 02/11/20   Lattie Corns, PA-C  feeding supplement (ENSURE ENLIVE / ENSURE PLUS) LIQD Take 237 mLs by mouth 2 (two) times daily between meals. 02/12/20   Wouk, Ailene Rud, MD  ferrous sulfate 325 (65 FE) MG EC tablet Take 1 tablet (325 mg total) by mouth 2 (two) times daily. 02/12/20 02/11/21  Wouk, Ailene Rud, MD  Melatonin 10 MG TABS Take 10 mg by mouth.    [provider]  MYRBETRIQ 25 MG TB24 tablet Take 25 mg by mouth daily. Patient not taking: Reported on 08/08/2020 01/22/20   [provider]  Polyethyl Glycol-Propyl Glycol (SYSTANE OP) Place 1 drop into both eyes 2 (two) times daily as needed (dry eyes).     [provider]  senna-docusate (SENOKOT-S) 8.6-50 MG tablet Take 1 tablet by mouth at bedtime as  needed for mild constipation. 02/12/20   Wouk, Ailene Rud, MD  sulfaSALAzine (AZULFIDINE) 500 MG tablet Take 500 mg by mouth 2 (two) times daily.  08/26/13   [provider]  traMADol (ULTRAM) 50 MG tablet Take 1-2 tablets (50-100 mg total) by mouth every 6 (six) hours as needed for moderate pain. 02/10/20   Lattie Corns, PA-C    Allergies Hydrocodone-acetaminophen, Clinoril [sulindac], Diclofenac, Meloxicam, Methotrexate derivatives, Prednisone, Relafen [nabumetone], and Trospium  Family History  Problem Relation Age of Onset  . Breast cancer Sister   . Lung cancer Brother     Social History Social History   Tobacco Use  . Smoking status: Former Smoker    Packs/day: 1.00    Years: 20.00    Pack years: 20.00  Types: Cigarettes  . Smokeless tobacco: Never Used  Vaping Use  . Vaping Use: Never used  Substance Use Topics  . Alcohol use: No  . Drug use: Never    Review of Systems  Review of Systems  Unable to perform ROS: Mental status change      ____________________________________________   PHYSICAL EXAM:  VITAL SIGNS: ED Triage Vitals  Enc Vitals Group     BP      Pulse      Resp      Temp      Temp src      SpO2      Weight      Height      Head Circumference      Peak Flow      Pain Score      Pain Loc      Pain Edu?      Excl. in [place]?    Vitals:   08/16/2020 1110 08/04/2020 1125  BP:  133/69  Pulse:  84  Resp:  18  Temp: (!) 97.5 F (36.4 C)   SpO2:  93%   Physical Exam Vitals and nursing note reviewed.  Constitutional:      General: She is not in acute distress.    Appearance: She is well-developed. She is ill-appearing.  HENT:     Head: Normocephalic and atraumatic.     Right Ear: External ear normal.     Left Ear: External ear normal.     Nose: Nose normal.  Eyes:     Conjunctiva/sclera: Conjunctivae normal.  Cardiovascular:     Rate and Rhythm: Normal rate and regular rhythm.     Heart sounds: No murmur  heard.   Pulmonary:     Effort: Pulmonary effort is normal. No respiratory distress.     Breath sounds: Normal breath sounds.  Abdominal:     Palpations: Abdomen is soft.     Tenderness: There is abdominal tenderness.  Musculoskeletal:     Cervical back: Neck supple.  Skin:    General: Skin is warm and dry.  Neurological:     Mental Status: She is disoriented and confused.     2+ bilateral radial and DP pulses.  No tenderness of deformities over the C/C/L-spine.  PERRLA.  EOMI.  Patient is able to give this examiner thumbs up in both hands and move her toes on command but does not otherwise participate in neuro exam.  She is able to state her name but is otherwise not oriented or able to describe any recent events or provide any additional information.  This is reportedly way off baseline for patient.  No deformities large effusion or point tenderness over the bilateral shoulders, elbows, wrists, hips, knees or ankles.  Some mild abdominal tenderness. ____________________________________________   LABS (all labs ordered are listed, but only abnormal results are displayed)  Labs Reviewed  COMPREHENSIVE METABOLIC PANEL - Abnormal; Notable for the following components:      Result Value   Sodium 132 (*)    Chloride 97 (*)    Glucose, Bld 135 (*)    Total Protein 6.4 (*)    Albumin 3.4 (*)    All other components within normal limits  URINALYSIS, COMPLETE (UACMP) WITH MICROSCOPIC - Abnormal; Notable for the following components:   Color, Urine AMBER (*)    APPearance CLEAR (*)    Ketones, ur 5 (*)    Protein, ur 30 (*)    All other components within  normal limits  RESP PANEL BY RT-PCR (FLU A&B, COVID) ARPGX2  CULTURE, BLOOD (SINGLE)  URINE CULTURE  GASTROINTESTINAL PANEL BY PCR, STOOL (REPLACES STOOL CULTURE)  CBC  ETHANOL  LACTIC ACID, PLASMA  PROTIME-INR  MAGNESIUM  APTT  SAMPLE TO BLOOD BANK  TROPONIN I (HIGH SENSITIVITY)  TROPONIN I (HIGH SENSITIVITY)    ____________________________________________  EKG  Sinus rhythm with a ventricular rate of 78, left anterior fascicle block, prolonged QTC at 542 without other appearance of acute ischemia. ____________________________________________  RADIOLOGY  ED MD interpretation: Chest x-ray shows chronic left lung disease without clear acute focal consolidation, effusion, edema, thorax or other clear acute thoracic process.  CT head and C-spine showed no clear acute fracture dislocation.  CT T-spine shows age-indeterminate T3 compression fracture.  L-spine is unremarkable.  CT chest on pelvis remarkable for likely pulmonary malignancy without pneumothorax or rib fracture or visceral injury in the chest abdomen or pelvis there is likely subacute left-sided rib fractures 7 and 8.  Official radiology report(s): CT Head Wo Contrast  Result Date: 08/26/2020 CLINICAL DATA:  87 year old female status post fall. EXAM: CT HEAD WITHOUT CONTRAST TECHNIQUE: Contiguous axial images were obtained from the base of the skull through the vertex without intravenous contrast. COMPARISON:  Brain MRI 02/07/2020.  Head CT 02/07/2020. FINDINGS: Brain: Cerebral volume not significantly changed. No midline shift, ventriculomegaly, mass effect, evidence of mass lesion, intracranial hemorrhage or evidence of cortically based acute infarction. Patchy and confluent bilateral white matter hypodensity appears stable since last year. No cortical encephalomalacia identified. Vascular: Advanced Calcified atherosclerosis at the skull base. No suspicious intracranial vascular hyperdensity. Skull: Stable, intact. Sinuses/Orbits: Visualized paranasal sinuses and mastoids are stable and well aerated. Other: No orbit or scalp soft tissue injury identified today. IMPRESSION: Stable. No acute intracranial abnormality or acute traumatic injury identified. Electronically Signed   By: Genevie Ann M.D.   On: 08/05/2020 11:56   CT Cervical Spine Wo  Contrast  Result Date: 08/01/2020 CLINICAL DATA:  87 year old female status post fall. EXAM: CT CERVICAL SPINE WITHOUT CONTRAST TECHNIQUE: Multidetector CT imaging of the cervical spine was performed without intravenous contrast. Multiplanar CT image reconstructions were also generated. COMPARISON:  CT cervical spine 02/07/2020. Head CT today reported separately. FINDINGS: Alignment: Preserved cervical lordosis. Cervicothoracic junction alignment is within normal limits. Bilateral posterior element alignment is within normal limits. Skull base and vertebrae: Osteopenia. Visualized skull base is intact. No atlanto-occipital dissociation. Preserved C1-C2 alignment. No acute osseous abnormality identified. Soft tissues and spinal canal: No prevertebral fluid or swelling. No visible canal hematoma. Stable and negative noncontrast visible neck soft tissues. Disc levels: Chronically ankylosed right side C4-C5 facets. Widespread chronic cervical disc calcification. But otherwise mild for age cervical spine degeneration. No significant spinal stenosis suspected. Upper chest: Reported separately today. IMPRESSION: 1. No acute traumatic injury identified in the cervical spine. Mild for age cervical degeneration. 2. See Thoracic Spine CT today reported separately. Electronically Signed   By: Genevie Ann M.D.   On: 08/27/2020 11:59   CT CHEST ABDOMEN PELVIS W CONTRAST  Result Date: 08/03/2020 CLINICAL DATA:  87 year old female with fall and chest, abdominal and pelvic pain. Altered mental status. EXAM: CT CHEST, ABDOMEN, AND PELVIS WITH CONTRAST TECHNIQUE: Multidetector CT imaging of the chest, abdomen and pelvis was performed following the standard protocol during bolus administration of intravenous contrast. CONTRAST:  14mL OMNIPAQUE IOHEXOL 300 MG/ML  SOLN COMPARISON:  04/28/2015 high-resolution chest CT and 10/24/2011 abdominal/pelvic CT FINDINGS: CT CHEST FINDINGS Cardiovascular: Normal heart size.  Heavy coronary artery  atherosclerotic calcifications are again noted. Thoracic aortic atherosclerotic calcifications noted without aneurysm. No pericardial effusion. Mediastinum/Nodes: No enlarged mediastinal, hilar, or axillary lymph nodes. Thyroid gland, trachea, and esophagus demonstrate no significant findings. Lungs/Pleura: New 3.6 x 5.6 cm consolidation/mass within the anterior mid LEFT UPPER lobe noted with heterogeneous enhancement. A trace LEFT pleural effusion is noted. Chronic interstitial changes/interlobular septal thickening with mild biapical pleuroparenchymal scarring again noted. LEFT hemithorax volume loss is unchanged. No pneumothorax. Musculoskeletal: Severe T3 compression fracture with vertebra plana and 5 mm retropulsion of the posteroinferior endplate is age indeterminate. Fractures of the posterior LEFT 6th and 7th ribs appear subacute. CT ABDOMEN PELVIS FINDINGS Hepatobiliary: The liver and gallbladder are unremarkable. Minimal fullness of the CBD/intrahepatic biliary system noted. Pancreas: Unremarkable Spleen: No significant abnormality Adrenals/Urinary Tract: No acute renal abnormalities. Small areas of renal scarring again identified. Adrenal glands and bladder are within normal limits. Stomach/Bowel: A large amount of stool noted within the distal colon and rectum. No dilated small bowel loops are identified. No definite bowel wall thickening or inflammatory changes are noted. Vascular/Lymphatic: Aortic atherosclerosis. No enlarged abdominal or pelvic lymph nodes. Reproductive: Status post hysterectomy. No adnexal masses. Other: No ascites, focal collection or pneumoperitoneum. Musculoskeletal: No acute or suspicious bony abnormalities are noted. Multilevel degenerative changes within the lumbar spine and L5-S1 spondylolisthesis again noted. IMPRESSION: 1. New 3.6 x 5.6 cm consolidation/mass within the anterior mid LEFT UPPER lobe - suspicious for malignancy, with infection less likely. Trace LEFT pleural  effusion. 2. Severe T3 compression fracture with vertebra plana and 5 mm retropulsion of the posteroinferior endplate, age indeterminate. 3. Fractures of the posterior LEFT 6th and 7th ribs - appear subacute. 4. No evidence of acute injury within the abdomen or pelvis. 5. Large amount of stool within the distal colon and rectum. 6. Chronic interstitial lung disease. 7. Coronary artery disease 8. Aortic atherosclerosis (ICD10-I70.0). Electronically Signed   By: Margarette Canada M.D.   On: 08/02/2020 12:32   CT T-SPINE NO CHARGE  Result Date: 08/27/2020 CLINICAL DATA:  87 year old female status post fall. EXAM: CT THORACIC SPINE WITH CONTRAST TECHNIQUE: Multiplanar CT images of the thoracic spine were reconstructed from contemporary CT of the Chest. CONTRAST:  No additional COMPARISON:  Cervical spine CT and CT Chest, Abdomen, and Pelvis today are reported separately. Chest CT 04/28/2015. Chest and rib series 05/08/2020. FINDINGS: Limited cervical spine imaging: Reported separately today. Cervicothoracic junction alignment is within normal limits. Thoracic spine segmentation:  Normal. Alignment: Abnormally exaggerated upper thoracic kyphosis related to the T3 finding. Relatively normal thoracic vertebral height and alignment elsewhere. Vertebrae: Osteopenia. Severe T3 compression fracture with vertebra plana (series 5, image 27). This is new since 2016, unclear whether this was present in January. The compressed vertebral body is sclerotic. Retropulsion of the posteroinferior endplate up to 5 mm, although the bony AP spinal canal remains about 8 mm none the less (series 6, image 81). T3 pedicles and posterior elements appear to remain intact with underlying chronic T3-T4 facet ankylosis which was present in 2016. T1 and T2 appear stable and intact. T4 through T12 appear stable and intact. a un healed posterior 7th rib fracture is new since 2016. Although there does appear to be some new periosteal bone formation there.  Similar appearance of the posterior left 6th and 7th ribs at the costovertebral junctions. No other posterior rib fracture identified. Paraspinal and other soft tissues: Thoracic paraspinal soft tissues remain within normal limits. Chest and abdomen are reported  separately today. Disc levels: Mild for age thoracic spine degeneration, with no CT evidence of significant spinal stenosis outside of the T3 level described above. IMPRESSION: 1. Severe T3 compression fracture with vertebra plana and 5 mm retropulsion of the posteroinferior endplate resulting in mild spinal stenosis. This is age indeterminate. 2. No other acute traumatic injury identified in the thoracic spine. 3. Subacute appearing comminuted fracture of the left posterior 7th rib, and the left 6th and 7th rib costovertebral junctions. 4.  CT Chest, Abdomen, and Pelvis today are reported separately. Electronically Signed   By: Genevie Ann M.D.   On: 08/11/2020 12:08   CT L-SPINE NO CHARGE  Result Date: 08/19/2020 CLINICAL DATA:  87 year old female status post fall. EXAM: CT LUMBAR SPINE WITH CONTRAST TECHNIQUE: Technique: Multiplanar CT images of the lumbar spine were reconstructed from contemporary CT of the Abdomen and Pelvis. CONTRAST:  No additional COMPARISON:  Thoracic spine CT today and CT Chest, Abdomen, and Pelvis today are reported separately. MRI lumbar spine 08/08/2018. FINDINGS: Segmentation: Normal, concordant with the thoracic spine numbering today. Alignment: Chronic levoconvex thoracic scoliosis and spondylolisthesis at L4-L5 (grade 1 and L5-S1 (grade 2) appears stable since 2020. Vertebrae: Diffuse osteopenia. Visible lower thoracic levels and lumbar vertebrae appear intact. Mild chronic endplate deformity inferiorly at L5 is stable. Previous sacroplasty. No recurrent sacral fracture is evident. Paraspinal and other soft tissues: Abdomen and pelvis are reported separately today. Lumbar paraspinal soft tissues remain within normal limits.  Disc levels: Stable lumbar spine degeneration since 2020, with only mild lower lumbar spinal stenosis despite the chronic spondylolisthesis. IMPRESSION: 1. No acute traumatic injury identified in the lumbar spine. Lumbar spine spondylolisthesis and degeneration appears stable from a 2020 MRI. 2.  CT Chest, Abdomen, and Pelvis today are reported separately. Electronically Signed   By: Genevie Ann M.D.   On: 08/10/2020 12:11   DG Chest Port 1 View  Result Date: 07/30/2020 CLINICAL DATA:  86 year old female status post fall. Altered mental status. EXAM: PORTABLE CHEST 1 VIEW COMPARISON:  Chest radiographs 05/08/2020 and earlier. FINDINGS: Portable AP upright view at 0905 hours. Chronic left lung volume loss and architectural distortion. Stable cardiac size and mediastinal contours. Calcified aortic atherosclerosis. Visualized tracheal air column is within normal limits. The right lung remains clear when allowing for portable technique. Degenerative osseous changes at the [facility] joints. No acute osseous abnormality identified. Negative visible bowel gas pattern. IMPRESSION: No acute cardiopulmonary abnormality. Stable chronic left lung disease Electronically Signed   By: Genevie Ann M.D.   On: 08/03/2020 09:38    ____________________________________________   PROCEDURES  Procedure(s) performed (including Critical Care):  .1-3 Lead EKG Interpretation Performed by: Lucrezia Starch, MD Authorized by: Lucrezia Starch, MD     Interpretation: normal     ECG rate assessment: normal     Rhythm: sinus rhythm     Ectopy: none     Conduction: normal       ____________________________________________   INITIAL IMPRESSION / ASSESSMENT AND PLAN / ED COURSE      Patient presents with above-stated history exam for assessment after witnessed fall at nursing facility.  This is in setting of some dementia and reported cognitive decline last couple weeks.  Arrival patient is slight tachypneic and not able to provide  any additional history other than stating her name although has a nonfocal exam without evidence of significant trauma on exam.  Differential includes possible progression of patient's known Alzheimer's disease, decreased cognition for underlying infectious process,  acute injury not obvious evidence on exam i.e. intracranial hemorrhage or acute visceral injury, arrhythmia, ACS, metabolic derangements, and anemia.  Chest x-ray shows chronic left-sided lung disease without clear focal new focal consolidation, effusion pneumothorax or rib fracture or other clear acute intrathoracic process.  EKG has no clear evidence of ischemia and no significant underlying arrhythmia.  CBC has leukocytosis or acute anemia and normal platelets.  Ethanol is undetectable.  INR is WNL.  Magnesium is WNL.  CMP shows no significant electrolyte or metabolic derangements.  UA does not appear infected.  CT head and C-spine showed no clear acute fracture dislocation.  CT T-spine shows age-indeterminate T3 compression fracture.  L-spine is unremarkable.  CT chest on pelvis remarkable for likely pulmonary malignancy without pneumothorax or rib fracture or visceral injury in the chest abdomen or pelvis there is likely subacute left-sided rib fractures 7 and 8.  Overall unclear allergy for patient's acute decline in mentation although she did have some diarrhea while in the ED.  Certainly possible she has an infectious enteritis and given acute decline with previously living at independent nursing facility I think admission is reasonable to rule out other organic etiologies for her altered mental status and so she can undergo PT and OT evaluations for possible higher level of care.  ____________________________________________   FINAL CLINICAL IMPRESSION(S) / ED DIAGNOSES  Final diagnoses:  Fall  Trauma  Closed wedge compression fracture of T3 vertebra, initial encounter ([place])  Closed fracture of multiple ribs of left side,  initial encounter  Lung mass  Altered mental status, unspecified altered mental status type    Medications  lidocaine (LIDODERM) 5 % 1 patch (1 patch Transdermal Patch Applied 08/21/2020 1328)  feeding supplement (ENSURE ENLIVE / ENSURE PLUS) liquid 237 mL (has no administration in time range)  melatonin tablet 10 mg (has no administration in time range)  senna-docusate (Senokot-S) tablet 1 tablet (has no administration in time range)  polyvinyl alcohol (LIQUIFILM TEARS) 1.4 % ophthalmic solution 1 drop (has no administration in time range)  lactated ringers bolus 500 mL (has no administration in time range)  acetaminophen (TYLENOL) tablet 650 mg (has no administration in time range)    Or  acetaminophen (TYLENOL) suppository 650 mg (has no administration in time range)  fentaNYL (SUBLIMAZE) injection 25 mcg (25 mcg Intravenous Given 08/11/2020 1005)  iohexol (OMNIPAQUE) 300 MG/ML solution 75 mL (75 mLs Intravenous Contrast Given 08/23/2020 1050)  acetaminophen (TYLENOL) tablet 1,000 mg (1,000 mg Oral Given 08/15/2020 1329)     ED Discharge Orders    None       Note:  This document was prepared using Dragon voice recognition software and may include unintentional dictation errors.   Lucrezia Starch, MD 08/17/2020 [phone]

## 2020-08-28 NOTE — ED Notes (Signed)
Floor RN JS notified via secure chat that although pt has not attempted to get out of bed alone during day shift family at bedside is requesting use of bed alarm and possible telesitter during night shift. Pt history of dementia.

## 2020-08-28 NOTE — ED Notes (Signed)
Floor secretary states nurse coming on at 1900 will be Dub Mikes. Pulled JS RN into report convo on secure chat so pt can go up at [place].

## 2020-08-29 DIAGNOSIS — M069 Rheumatoid arthritis, unspecified: Secondary | ICD-10-CM

## 2020-08-29 DIAGNOSIS — K5900 Constipation, unspecified: Secondary | ICD-10-CM

## 2020-08-29 DIAGNOSIS — R918 Other nonspecific abnormal finding of lung field: Secondary | ICD-10-CM

## 2020-08-29 DIAGNOSIS — S22030A Wedge compression fracture of third thoracic vertebra, initial encounter for closed fracture: Secondary | ICD-10-CM

## 2020-08-29 LAB — COMPREHENSIVE METABOLIC PANEL
ALT: 13 U/L (ref 0–44)
AST: 25 U/L (ref 15–41)
Albumin: 3.5 g/dL (ref 3.5–5.0)
Alkaline Phosphatase: 71 U/L (ref 38–126)
Anion gap: 10 (ref 5–15)
BUN: 27 mg/dL — ABNORMAL HIGH (ref 8–23)
CO2: 28 mmol/L (ref 22–32)
Calcium: 9.5 mg/dL (ref 8.9–10.3)
Chloride: 99 mmol/L (ref 98–111)
Creatinine, Ser: 0.62 mg/dL (ref 0.44–1.00)
GFR, Estimated: >60 mL/min (ref >60)
Glucose, Bld: 86 mg/dL (ref 70–99)
Potassium: 4.1 mmol/L (ref 3.5–5.1)
Sodium: 137 mmol/L (ref 135–145)
Total Bilirubin: 0.6 mg/dL (ref 0.3–1.2)
Total Protein: 6.5 g/dL (ref 6.5–8.1)

## 2020-08-29 LAB — URINE CULTURE: Culture: NO GROWTH

## 2020-08-29 LAB — CBC
HCT: 35.4 % — ABNORMAL LOW (ref 36.0–46.0)
Hemoglobin: 11.8 g/dL — ABNORMAL LOW (ref 12.0–15.0)
MCH: 30.8 pg (ref 26.0–34.0)
MCHC: 33.3 g/dL (ref 30.0–36.0)
MCV: 92.4 fL (ref 80.0–100.0)
Platelets: 332 10*3/uL (ref 150–400)
RBC: 3.83 MIL/uL — ABNORMAL LOW (ref 3.87–5.11)
RDW: 12.9 % (ref 11.5–15.5)
WBC: 10.1 10*3/uL (ref 4.0–10.5)
nRBC: 0 % (ref 0.0–0.2)

## 2020-08-29 LAB — BLOOD GAS, VENOUS
Acid-Base Excess: 5.2 mmol/L — ABNORMAL HIGH (ref 0.0–2.0)
Bicarbonate: 31 mmol/L — ABNORMAL HIGH (ref 20.0–28.0)
O2 Saturation: 99.9 %
Patient temperature: 37
pCO2, Ven: 50 mmHg (ref 44.0–60.0)
pH, Ven: 7.4 (ref 7.250–7.430)
pO2, Ven: 267 mmHg — ABNORMAL HIGH (ref 32.0–45.0)

## 2020-08-29 LAB — MRSA PCR SCREENING: MRSA by PCR: NEGATIVE

## 2020-08-29 LAB — MAGNESIUM: Magnesium: 1.9 mg/dL (ref 1.7–2.4)

## 2020-08-29 MED ORDER — QUETIAPINE FUMARATE 25 MG PO TABS
12.5000 mg | ORAL_TABLET | Freq: Two times a day (BID) | ORAL | Status: DC
  Administered 2020-08-29 – 2020-09-01 (×8): 12.5 mg via ORAL
  Filled 2020-08-29 (×8): qty 1

## 2020-08-29 MED ORDER — SENNOSIDES-DOCUSATE SODIUM 8.6-50 MG PO TABS
1.0000 | ORAL_TABLET | Freq: Two times a day (BID) | ORAL | Status: DC
  Administered 2020-08-29 – 2020-08-30 (×3): 1 via ORAL
  Filled 2020-08-29 (×3): qty 1

## 2020-08-29 MED ORDER — QUETIAPINE FUMARATE 25 MG PO TABS: 12.5000 mg | ORAL_TABLET | Freq: Two times a day (BID) | ORAL | Status: DC

## 2020-08-29 MED ORDER — FAMOTIDINE 40 MG/5ML PO SUSR
20.0000 mg | Freq: Every day | ORAL | Status: DC
  Administered 2020-08-29 – 2020-08-30 (×2): 20 mg via ORAL
  Filled 2020-08-29 (×2): qty 2.5

## 2020-08-29 MED ORDER — LORAZEPAM 2 MG/ML IJ SOLN: 0.2500 mg | Freq: Two times a day (BID) | INTRAMUSCULAR | Status: DC | PRN

## 2020-08-29 MED ORDER — SODIUM CHLORIDE 0.9 % IV SOLN: INTRAVENOUS | Status: DC

## 2020-08-29 MED ORDER — LORAZEPAM 2 MG/ML IJ SOLN
0.5000 mg | Freq: Two times a day (BID) | INTRAMUSCULAR | Status: DC | PRN
  Administered 2020-08-29: 0.5 mg via INTRAVENOUS
  Filled 2020-08-29: qty 1

## 2020-08-29 MED ORDER — BISACODYL 10 MG RE SUPP
10.0000 mg | Freq: Every day | RECTAL | Status: DC
  Administered 2020-08-29: 10 mg via RECTAL
  Filled 2020-08-29 (×2): qty 1

## 2020-08-29 NOTE — Evaluation (Signed)
Occupational Therapy Evaluation Patient Details Name: Emma Kennedy MRN: [id number] DOB: Jul 29, 1929 Today's Date: 08/29/2020    History of Present Illness Pt is an 87 y/o F admitted on 08/21/2020 with AMS. Pt being treated for acute metabolic encephalopathy. She did experience 1 fall at her facility, resulting in her hitting her head but no loss of consciousness. Of note, pt with a finding of a new mass in L upper lobe that is suspicious for malignancy. PMH: dementia, acquired hypothyroidism, RA, chronic ILD, arthritis, OA   Clinical Impression   Pt seen for OT evaluation/treatment on this date. Upon arrival to room, pt sitting EOB with PT. Per PT, pt with SpO2 86-88% on RA following ambulation with PT. Nursing staff notified and instructed to place pt on 2L/min via [place]. [place] placed for duration of session (SpO2>90% throughout) and pt left in no acute distress and with  donned at end of session. At baseline, pt has dementia. This date, pt unable to state full name, however responds to name when called. Pt pleasant, however required MAX verbal cues to follow 1-step instructions of familiar tasks. At baseline, pt lives in an assistive living facility. Per chart review, pt MOD-I with RW, however has required increase assist with transfers over the past 2 weeks. [place] daughter reports that pt is typically able to complete familiar grooming tasks independently.   This date, pt with difficulty sequencing familiar tasks, requiring MAX verbal cues and HHA to wash hands while standing at sink and to apply deodorant while seated EOB. Pt able to wash/dry face with SUPERVISION/SET-UP ASSIST. Pt would benefit from additional skilled OT services to maximize return to PLOF and minimize risk of future falls, injury, caregiver burden, and readmission. Upon discharge, recommend home health OT and supervision/assistance 24/7.    Follow Up Recommendations  Home health OT;Supervision/Assistance - 24 hour    Equipment  Recommendations  None recommended by OT       Precautions / Restrictions Precautions Precautions: Fall Restrictions Weight Bearing Restrictions: No      Mobility Bed Mobility Overal bed mobility: Needs Assistance Bed Mobility: Sit to Supine     Sit to supine: Min assist   General bed mobility comments: MAX multi-modal cues to initiate sit>supine.    Transfers Overall transfer level: Needs assistance Equipment used: Rolling walker (2 wheeled) Transfers: Sit to/from Stand Sit to Stand: Min assist         General transfer comment: Multimodal cues for safe hand placement    Balance Overall balance assessment: Needs assistance Sitting-balance support: No upper extremity supported;Feet supported Sitting balance-Leahy Scale: Fair Sitting balance - Comments: Able to maintain static sitting at EOB     Standing balance-Leahy Scale: Poor Standing balance comment: Intermittent MIN A during standing hand hygiene d/t posterior lean                           ADL either performed or assessed with clinical judgement   ADL Overall ADL's : Needs assistance/impaired                                     Functional mobility during ADLs: Minimal assistance;Rolling walker General ADL Comments: Pt with difficulty sequencing familiar tasks, requiring MAX verbal cues and HHA to wash hands while standing at sink and apply deodorant while seated EOB. Pt able to wash/dry face with SUPERVISION/SET-UP ASSIST.  Vision   Additional Comments: Unable to assess d/t pt's cognition            Pertinent Vitals/Pain Pain Assessment: No/denies pain Faces Pain Scale: No hurt        Extremity/Trunk Assessment Upper Extremity Assessment Upper Extremity Assessment: Generalized weakness   Lower Extremity Assessment Lower Extremity Assessment: Generalized weakness          Cognition Arousal/Alertness: Awake/alert Behavior During Therapy: Flat affect Overall  Cognitive Status: History of cognitive impairments - at baseline                                 General Comments: At baseline, pt has dementia. This date, pt unable to state full name, however responds to name when called. Pt pleasant, however required MAX verbal cues to follow 1-step instructions of familiar ADLs.   General Comments  Upon arrival to room, SpO2 86-88% on RA following ambulation with PT. Nursing staff notified and instructed to place pt on 2L/min via [place]. [place] placed for duration of session and pt left with [place] donned            Home Living Family/patient expects to be discharged to:: Assisted living                                        Prior Functioning/Environment Level of Independence: Needs assistance  Gait / Transfers Assistance Needed: Per chart review, pt MOD-I with RW, however has required increase assist with transfers over the past 2 weeks ADL's / Homemaking Assistance Needed: Per grand daughter, pt was independent with grooming ADLs. Communication / Swallowing Assistance Needed: Pt with mumbled speech throughout eval.          OT Problem List: Decreased strength;Decreased activity tolerance;Impaired balance (sitting and/or standing);Decreased cognition;Decreased safety awareness      OT Treatment/Interventions: Self-care/ADL training;Therapeutic exercise;Energy conservation;DME and/or AE instruction;Therapeutic activities;Cognitive remediation/compensation;Patient/family education;Balance training    OT Goals(Current goals can be found in the care plan section) Acute Rehab OT Goals OT Goal Formulation: Patient unable to participate in goal setting Time For Goal Achievement: 09/12/20 ADL Goals Pt Will Perform Grooming: with supervision;standing Pt Will Perform Upper Body Bathing: with set-up;with supervision;sitting Pt Will Transfer to Toilet: with supervision;ambulating;regular height toilet;grab bars  OT Frequency: Min  1X/week    AM-PAC OT "6 Clicks" Daily Activity     Outcome Measure Help from another person eating meals?: A Little Help from another person taking care of personal grooming?: A Lot Help from another person toileting, which includes using toliet, bedpan, or urinal?: A Lot Help from another person bathing (including washing, rinsing, drying)?: A Lot Help from another person to put on and taking off regular upper body clothing?: A Little Help from another person to put on and taking off regular lower body clothing?: A Lot 6 Click Score: 14   End of Session Equipment Utilized During Treatment: Gait belt;Rolling walker Nurse Communication: Mobility status;Other (comment) (SpO2)  Activity Tolerance: Patient tolerated treatment well Patient left: in bed;with call bell/phone within reach;with bed alarm set;with family/visitor present;Other (comment) (with mittens donned)  OT Visit Diagnosis: Muscle weakness (generalized) (M62.81);Unsteadiness on feet (R26.81)                Time: 2725-3664 OT Time Calculation (min): 30 min Charges:  OT General Charges $OT Visit: 1 Visit  OT Evaluation $OT Eval Moderate Complexity: 1 Mod OT Treatments $Self Care/Home Management : 8-22 mins  Fredirick Maudlin, OTR/L [place]

## 2020-08-29 NOTE — Evaluation (Signed)
Physical Therapy Evaluation Patient Details Name: Emma Kennedy MRN: [id number] DOB: February 27, 1930 Today's Date: 08/29/2020   History of Present Illness  Pt is an 87 y/o F admitted on 08/24/2020 with AMS. Pt being treated for acute metabolic encephalopathy. She did experience 1 fall at her facility, resulting in her hitting her head but no loss of consciousness. Of note, pt with a finding of a new mass in L upper lobe that is suspicious for malignancy. PMH: dementia, acquired hypothyroidism, RA, chronic ILD, arthritis, OA  Clinical Impression  Pt received in bed asleep but easily awakens to name. Pt very pleasant but confused throughout session. Pt requires min assist for supine>sit & min assist for gait with RW. Pt is able to ambulate increased distances with AD but does require heavy cuing & min assist for RW management & obstacle avoidance. Pt is able to attend to BLE strengthening exercises while seated EOB as well. Pt noted to have low SpO2 on room air after gait & pt placed on 2L/min. Pt would benefit from ongoing PT services to address endurance, strength, balance & gait.   Of note, pt unable to provide any PLOF information, information below obtained from chart.     Follow Up Recommendations Home health PT;Supervision/Assistance - 24 hour    Equipment Recommendations  Rolling walker with 5" wheels    Recommendations for Other Services       Precautions / Restrictions Precautions Precautions: Fall Restrictions Weight Bearing Restrictions: No      Mobility  Bed Mobility Overal bed mobility: Needs Assistance Bed Mobility: Supine to Sit;Sit to Supine     Supine to sit: Min assist;HOB elevated Sit to supine: Supervision;HOB elevated   General bed mobility comments: tactile cuing to initiate supine>sit    Transfers Overall transfer level: Needs assistance   Transfers: Sit to/from Stand Sit to Stand: Min assist            Ambulation/Gait Ambulation/Gait assistance: Min  assist Gait Distance (Feet): 170 Feet Assistive device: Rolling walker (2 wheeled) Gait Pattern/deviations: Decreased step length - left;Decreased step length - right;Decreased stride length Gait velocity: decreased   General Gait Details: pt demonstrates L gaze preference when lying in bed & during gait, requiring frequent cuing for obstacle avoidance & steering walker, especially in small room, decreased attention/awareness as pt requires cuing to not run into wall in hallway  Stairs            Wheelchair Mobility    Modified Rankin (Stroke Patients Only)       Balance Overall balance assessment: Needs assistance Sitting-balance support: No upper extremity supported;Feet supported Sitting balance-Leahy Scale: Fair Sitting balance - Comments: posterior LOB when performing BLE LAQ     Standing balance-Leahy Scale: Fair                               Pertinent Vitals/Pain Pain Assessment: Faces Faces Pain Scale: No hurt    Home Living Family/patient expects to be discharged to:: Assisted living                      Prior Function Level of Independence: Needs assistance   Gait / Transfers Assistance Needed: Per chart review, pt MOD-I with RW, however has required increase assist with transfers over the past 2 weeks           Hand Dominance        Extremity/Trunk Assessment  Upper Extremity Assessment Upper Extremity Assessment: Generalized weakness    Lower Extremity Assessment Lower Extremity Assessment: Generalized weakness       Communication      Cognition Arousal/Alertness: Awake/alert Behavior During Therapy: Flat affect Overall Cognitive Status: History of cognitive impairments - at baseline                                 General Comments: baseline dementia, unable to state full name nor recall birthday but does answer to her name when PT states it      General Comments General comments (skin  integrity, edema, etc.): after gait SpO2 86-89% on room air - nursing staff notified & instructed to place pt on 2L/min via nasal cannula - did so & left pt in handoff to OT    Exercises General Exercises - Lower Extremity Long Arc Quad: AROM;Strengthening;Both;10 reps;Seated   Assessment/Plan    PT Assessment Patient needs continued PT services  PT Problem List Decreased strength;Decreased mobility;Decreased safety awareness;Cardiopulmonary status limiting activity;Decreased balance;Decreased knowledge of use of DME;Decreased activity tolerance       PT Treatment Interventions DME instruction;Therapeutic activities;Therapeutic exercise;Patient/family education;Gait training;Cognitive remediation;Stair training;Balance training;Functional mobility training;Manual techniques;Neuromuscular re-education    PT Goals (Current goals can be found in the Care Plan section)  Acute Rehab PT Goals PT Goal Formulation: Patient unable to participate in goal setting Time For Goal Achievement: 09/12/20 Potential to Achieve Goals: Fair    Frequency Min 2X/week   Barriers to discharge        Co-evaluation               AM-PAC PT "6 Clicks" Mobility  Outcome Measure Help needed turning from your back to your side while in a flat bed without using bedrails?: A Little Help needed moving from lying on your back to sitting on the side of a flat bed without using bedrails?: A Little Help needed moving to and from a bed to a chair (including a wheelchair)?: A Little Help needed standing up from a chair using your arms (e.g., wheelchair or bedside chair)?: A Little Help needed to walk in hospital room?: A Little Help needed climbing 3-5 steps with a railing? : A Lot 6 Click Score: 17    End of Session Equipment Utilized During Treatment: Gait belt Activity Tolerance: Patient tolerated treatment well Patient left:  (seated EOB in handoff to OT) Nurse Communication: Mobility status (O2) PT  Visit Diagnosis: Muscle weakness (generalized) (M62.81);Difficulty in walking, not elsewhere classified (R26.2)    Time: 0973-5329 PT Time Calculation (min) (ACUTE ONLY): 25 min   Charges:   PT Evaluation $PT Eval Low Complexity: 1 Low PT Treatments $Therapeutic Activity: 8-22 mins        Lavone Nian, PT, DPT 08/29/20, 3:33 PM   Waunita Schooner 08/29/2020, 3:31 PM

## 2020-08-29 NOTE — Progress Notes (Signed)
PROGRESS NOTE    Emma Kennedy  GMW:[id number] DOB: 24-Oct-1929 DOA: 08/23/2020 PCP: Adin Hector, MD   Chief Complaint  Patient presents with  . Fall  . Altered Mental Status    Brief Narrative:  Patient is a 87 year old female history of dementia, hypothyroidism, RA, chronic interstitial lung disease presented to the ED with acute metabolic encephalopathy after presenting from independent living facility for evaluation of altered mental status. CT head done negative.  CT chest abdomen and pelvis done concerning for lung mass.  Infectious work-up negative to date.   Assessment & Plan:   Principal Problem:   Acute metabolic encephalopathy Active Problems:   RA (rheumatoid arthritis) (HCC)   Hypothyroidism   Generalized weakness   Acute hyponatremia   Dehydration   Prolonged QT interval   Closed wedge compression fracture of third thoracic vertebra (HCC)   Lung mass   Constipation  1 acute metabolic encephalopathy -Patient presenting with worsening somnolence/lethargy which per admitting physician abruptly started 2 weeks prior to admission versus progressive decline. -Patient with history of underlying advanced dementia. -Patient noted to be in an independent living facility prior to onset of symptoms. -CT head done negative for any acute abnormalities. -Infectious work-up negative to date. -Urinalysis clear, nitrite negative, leukocytes negative, 0-5 WBC. -Chest x-ray done negative for any acute abnormalities. -CT chest/abdomen/pelvis done with new 3.6 x 5.6 cm consolidation/mass within the anterior mid left upper lobe suspicious for malignancy, infection less likely, trace left pleural effusion, severe T3 compression fracture age-indeterminate, no evidence of acute injury in the abdomen or pelvis, large amount of stool within the distal colon and rectum, chronic interstitial lung disease. -TSH within normal limits. -Patient also noted to be dehydrated on  examination. -Continue IV fluids. -Placed on bowel regimen due to constipation noted on CT abdomen and pelvis. -Try to minimize sedating medications. -Patient Cymbalta, mirtazapine, gabapentin on hold. -Supportive care. -Palliative care consultation pending  2.  Generalized weakness -Per family patient with generalized weakness associated with worsening somnolence/lethargy. -Concerned that patient may need a higher level of care. -Infectious work-up negative to date. -CT head negative. -PT/OT.  3.  Hyponatremia -Likely secondary to hypovolemic hyponatremia secondary to poor oral intake. -Improved with hydration. -Follow.  4.  LUL lung mass -3.6 x 5.6 cm mass noted in LUL on CT chest suspicious for malignancy. -Admitting physician spoke with patient's daughter, POA, who conveyed that patient would not want any histopathological identification of the mass nor want any further evaluation nor want any life prolonging measures or interventions if it relates to suspected malignancy. -Consult with palliative care.  5.  Prolonged QTc  Interval -Repeat EKG with resolution of QTC prolongation.  6.  Advanced dementia with behavioral disturbance -Patient noted to have significant agitation this morning and given IV Ativan which led to sedation. -Patient currently in mittens. -Place on Seroquel 12.5 mg p.o. twice daily and uptitrate as needed. -IV Ativan 0.25 mg twice daily as needed agitation. -Palliative care consultation  7.  Hypothyroidism -TSH within normal limits -Continue Synthroid.  8.  Rheumatoid arthritis -Continue sulfasalazine and Plaquenil. -Outpatient follow-up.  9.  GERD -PPI on hold. -Pepcid.  10.  Constipation -Noted on CT abdomen and pelvis -Dulcolax suppository, Senokot-S twice daily, MiraLAX daily.  11.  T3 compression fracture -Noted on CT.  Age-indeterminate. -PT/OT. -Pain management.     DVT prophylaxis: SCDs Code Status: DNR Family  Communication: Updated daughter at bedside Disposition:   Status is: Inpatient    Dispo:  The patient is from: Independent living/ALF              Anticipated d/c is to: SNF versus memory care unit              Patient currently on IV fluids, being evaluated for lethargy.  Not stable for discharge   Difficult to place patient no       Consultants:   Palliative care pending  Procedures:   CT head/CT C-spine 08/27/2020  CT chest abdomen and pelvis 08/10/2020  Chest x-ray 08/07/2020  Antimicrobials:   None   Subjective: Patient sedated, sleeping, snoring.  Mittens on.  Received IV Ativan this morning due to agitation.  Daughter at bedside  Objective: Vitals:   08/23/2020 2032 08/29/20 0500 08/29/20 0534 08/29/20 0821  BP: 137/77  (!) 149/87 (!) 145/78  Pulse: 77  77 82  Resp: 20  16 17   Temp: 97.7 F (36.5 C)  97.6 F (36.4 C) 98.1 F (36.7 C)  TempSrc: Oral  Axillary   SpO2: 98%  91% 96%  Weight:  39.1 kg    Height:        Intake/Output Summary (Last 24 hours) at 08/29/2020 1839 Last data filed at 08/29/2020 1734 Gross per 24 hour  Intake 994.98 ml  Output --  Net 994.98 ml   Filed Weights   08/16/2020 0907 08/29/20 0500  Weight: 38 kg 39.1 kg    Examination:  General exam: Sedated.  Mittens Respiratory system: Clear to auscultation anterior lung fields. Respiratory effort normal. Cardiovascular system: S1 & S2 heard, RRR. No JVD, murmurs, rubs, gallops or clicks. No pedal edema. Gastrointestinal system: Abdomen is nondistended, soft and nontender. No organomegaly or masses felt. Normal bowel sounds heard. Central nervous system: Sedated. No focal neurological deficits. Extremities: Symmetric 5 x 5 power. Skin: No rashes, lesions or ulcers Psychiatry: Judgement and insight appear normal. Mood & affect appropriate.     Data Reviewed: I have personally reviewed following labs and imaging studies  CBC: Recent Labs  Lab 08/09/2020 0917 08/29/20 0517   WBC 8.8 10.1  HGB 12.4 11.8*  HCT 37.9 35.4*  MCV 94.3 92.4  PLT 371 833    Basic Metabolic Panel: Recent Labs  Lab 08/03/2020 0917 08/29/20 0517  NA 132* 137  K 4.3 4.1  CL 97* 99  CO2 26 28  GLUCOSE 135* 86  BUN 19 27*  CREATININE 0.74 0.62  CALCIUM 9.6 9.5  MG 2.2 1.9    GFR: Estimated Creatinine Clearance: 29.4 mL/min (by C-G formula based on SCr of 0.62 mg/dL).  Liver Function Tests: Recent Labs  Lab 08/24/2020 0917 08/29/20 0517  AST 26 25  ALT 13 13  ALKPHOS 84 71  BILITOT 0.6 0.6  PROT 6.4* 6.5  ALBUMIN 3.4* 3.5    CBG: No results for input(s): GLUCAP in the last 168 hours.   Recent Results (from the past 240 hour(s))  Resp Panel by RT-PCR (Flu A&B, Covid) Nasopharyngeal Swab     Status: None   Collection Time: 08/15/2020  9:17 AM   Specimen: Nasopharyngeal Swab; Nasopharyngeal(NP) swabs in vial transport medium  Result Value Ref Range Status   SARS Coronavirus 2 by RT PCR NEGATIVE NEGATIVE Final    Comment: (NOTE) SARS-CoV-2 target nucleic acids are NOT DETECTED.  The SARS-CoV-2 RNA is generally detectable in upper respiratory specimens during the acute phase of infection. The lowest concentration of SARS-CoV-2 viral copies this assay can detect is 138 copies/mL. A negative result does not  preclude SARS-Cov-2 infection and should not be used as the sole basis for treatment or other patient management decisions. A negative result may occur with  improper specimen collection/handling, submission of specimen other than nasopharyngeal swab, presence of viral mutation(s) within the areas targeted by this assay, and inadequate number of viral copies(<138 copies/mL). A negative result must be combined with clinical observations, patient history, and epidemiological information. The expected result is Negative.  Fact Sheet for Patients:  EntrepreneurPulse.com.au  Fact Sheet for Healthcare Providers:   IncredibleEmployment.be  This test is no t yet approved or cleared by the [place] FDA and  has been authorized for detection and/or diagnosis of SARS-CoV-2 by FDA under an Emergency Use Authorization (EUA). This EUA will remain  in effect (meaning this test can be used) for the duration of the COVID-19 declaration under Section 564(b)(1) of the Act, 21 U.S.C.section 360bbb-3(b)(1), unless the authorization is terminated  or revoked sooner.       Influenza A by PCR NEGATIVE NEGATIVE Final   Influenza B by PCR NEGATIVE NEGATIVE Final    Comment: (NOTE) The Xpert Xpress SARS-CoV-2/FLU/RSV plus assay is intended as an aid in the diagnosis of influenza from Nasopharyngeal swab specimens and should not be used as a sole basis for treatment. Nasal washings and aspirates are unacceptable for Xpert Xpress SARS-CoV-2/FLU/RSV testing.  Fact Sheet for Patients: EntrepreneurPulse.com.au  Fact Sheet for Healthcare Providers: IncredibleEmployment.be  This test is not yet approved or cleared by the [place] FDA and has been authorized for detection and/or diagnosis of SARS-CoV-2 by FDA under an Emergency Use Authorization (EUA). This EUA will remain in effect (meaning this test can be used) for the duration of the COVID-19 declaration under Section 564(b)(1) of the Act, 21 U.S.C. section 360bbb-3(b)(1), unless the authorization is terminated or revoked.  Performed at [facility], [address] [place], [place] [address]   Blood culture (routine single)     Status: None (Preliminary result)   Collection Time: 08/17/2020  9:17 AM   Specimen: BLOOD  Result Value Ref Range Status   Specimen Description BLOOD LEFT ANTECUBITAL  Final   Special Requests   Final    BOTTLES DRAWN AEROBIC AND ANAEROBIC Blood Culture results may not be optimal due to an inadequate volume of blood received in culture bottles   Culture    Final    NO GROWTH < 24 HOURS Performed at [facility], [address], [place], [place] [address]    Report Status PENDING  Incomplete  Urine culture     Status: None   Collection Time: 08/24/2020  9:28 AM   Specimen: Urine, Random  Result Value Ref Range Status   Specimen Description   Final    URINE, RANDOM Performed at [facility], [address], [place], [place] [address]    Special Requests   Final    NONE Performed at [facility], [address], [place], [place] [address]    Culture   Final    NO GROWTH Performed at [facility], [place] [address], [place], [place] [address]    Report Status 08/29/2020 FINAL  Final         Radiology Studies: CT Head Wo Contrast  Result Date: 08/27/2020 CLINICAL DATA:  87 year old female status post fall. EXAM: CT HEAD WITHOUT CONTRAST TECHNIQUE: Contiguous axial images were obtained from the base of the skull through the vertex without intravenous contrast. COMPARISON:  Brain MRI 02/07/2020.  Head CT  02/07/2020. FINDINGS: Brain: Cerebral volume not significantly changed. No midline shift, ventriculomegaly, mass effect, evidence of mass lesion, intracranial hemorrhage or evidence of cortically based acute infarction. Patchy and confluent bilateral white matter hypodensity appears stable since last year. No cortical encephalomalacia identified. Vascular: Advanced Calcified atherosclerosis at the skull base. No suspicious intracranial vascular hyperdensity. Skull: Stable, intact. Sinuses/Orbits: Visualized paranasal sinuses and mastoids are stable and well aerated. Other: No orbit or scalp soft tissue injury identified today. IMPRESSION: Stable. No acute intracranial abnormality or acute traumatic injury identified. Electronically Signed   By: Genevie Ann M.D.   On: 08/06/2020 11:56   CT Cervical Spine Wo Contrast  Result Date: 08/03/2020 CLINICAL DATA:  87 year old female status post fall. EXAM: CT CERVICAL SPINE  WITHOUT CONTRAST TECHNIQUE: Multidetector CT imaging of the cervical spine was performed without intravenous contrast. Multiplanar CT image reconstructions were also generated. COMPARISON:  CT cervical spine 02/07/2020. Head CT today reported separately. FINDINGS: Alignment: Preserved cervical lordosis. Cervicothoracic junction alignment is within normal limits. Bilateral posterior element alignment is within normal limits. Skull base and vertebrae: Osteopenia. Visualized skull base is intact. No atlanto-occipital dissociation. Preserved C1-C2 alignment. No acute osseous abnormality identified. Soft tissues and spinal canal: No prevertebral fluid or swelling. No visible canal hematoma. Stable and negative noncontrast visible neck soft tissues. Disc levels: Chronically ankylosed right side C4-C5 facets. Widespread chronic cervical disc calcification. But otherwise mild for age cervical spine degeneration. No significant spinal stenosis suspected. Upper chest: Reported separately today. IMPRESSION: 1. No acute traumatic injury identified in the cervical spine. Mild for age cervical degeneration. 2. See Thoracic Spine CT today reported separately. Electronically Signed   By: Genevie Ann M.D.   On: 07/31/2020 11:59   CT CHEST ABDOMEN PELVIS W CONTRAST  Result Date: 08/24/2020 CLINICAL DATA:  87 year old female with fall and chest, abdominal and pelvic pain. Altered mental status. EXAM: CT CHEST, ABDOMEN, AND PELVIS WITH CONTRAST TECHNIQUE: Multidetector CT imaging of the chest, abdomen and pelvis was performed following the standard protocol during bolus administration of intravenous contrast. CONTRAST:  33mL OMNIPAQUE IOHEXOL 300 MG/ML  SOLN COMPARISON:  04/28/2015 high-resolution chest CT and 10/24/2011 abdominal/pelvic CT FINDINGS: CT CHEST FINDINGS Cardiovascular: Normal heart size. Heavy coronary artery atherosclerotic calcifications are again noted. Thoracic aortic atherosclerotic calcifications noted without  aneurysm. No pericardial effusion. Mediastinum/Nodes: No enlarged mediastinal, hilar, or axillary lymph nodes. Thyroid gland, trachea, and esophagus demonstrate no significant findings. Lungs/Pleura: New 3.6 x 5.6 cm consolidation/mass within the anterior mid LEFT UPPER lobe noted with heterogeneous enhancement. A trace LEFT pleural effusion is noted. Chronic interstitial changes/interlobular septal thickening with mild biapical pleuroparenchymal scarring again noted. LEFT hemithorax volume loss is unchanged. No pneumothorax. Musculoskeletal: Severe T3 compression fracture with vertebra plana and 5 mm retropulsion of the posteroinferior endplate is age indeterminate. Fractures of the posterior LEFT 6th and 7th ribs appear subacute. CT ABDOMEN PELVIS FINDINGS Hepatobiliary: The liver and gallbladder are unremarkable. Minimal fullness of the CBD/intrahepatic biliary system noted. Pancreas: Unremarkable Spleen: No significant abnormality Adrenals/Urinary Tract: No acute renal abnormalities. Small areas of renal scarring again identified. Adrenal glands and bladder are within normal limits. Stomach/Bowel: A large amount of stool noted within the distal colon and rectum. No dilated small bowel loops are identified. No definite bowel wall thickening or inflammatory changes are noted. Vascular/Lymphatic: Aortic atherosclerosis. No enlarged abdominal or pelvic lymph nodes. Reproductive: Status post hysterectomy. No adnexal masses. Other: No ascites, focal collection or pneumoperitoneum. Musculoskeletal: No acute or suspicious bony abnormalities are noted.  Multilevel degenerative changes within the lumbar spine and L5-S1 spondylolisthesis again noted. IMPRESSION: 1. New 3.6 x 5.6 cm consolidation/mass within the anterior mid LEFT UPPER lobe - suspicious for malignancy, with infection less likely. Trace LEFT pleural effusion. 2. Severe T3 compression fracture with vertebra plana and 5 mm retropulsion of the posteroinferior  endplate, age indeterminate. 3. Fractures of the posterior LEFT 6th and 7th ribs - appear subacute. 4. No evidence of acute injury within the abdomen or pelvis. 5. Large amount of stool within the distal colon and rectum. 6. Chronic interstitial lung disease. 7. Coronary artery disease 8. Aortic atherosclerosis (ICD10-I70.0). Electronically Signed   By: Margarette Canada M.D.   On: 08/21/2020 12:32   CT T-SPINE NO CHARGE  Result Date: 08/09/2020 CLINICAL DATA:  87 year old female status post fall. EXAM: CT THORACIC SPINE WITH CONTRAST TECHNIQUE: Multiplanar CT images of the thoracic spine were reconstructed from contemporary CT of the Chest. CONTRAST:  No additional COMPARISON:  Cervical spine CT and CT Chest, Abdomen, and Pelvis today are reported separately. Chest CT 04/28/2015. Chest and rib series 05/08/2020. FINDINGS: Limited cervical spine imaging: Reported separately today. Cervicothoracic junction alignment is within normal limits. Thoracic spine segmentation:  Normal. Alignment: Abnormally exaggerated upper thoracic kyphosis related to the T3 finding. Relatively normal thoracic vertebral height and alignment elsewhere. Vertebrae: Osteopenia. Severe T3 compression fracture with vertebra plana (series 5, image 27). This is new since 2016, unclear whether this was present in January. The compressed vertebral body is sclerotic. Retropulsion of the posteroinferior endplate up to 5 mm, although the bony AP spinal canal remains about 8 mm none the less (series 6, image 81). T3 pedicles and posterior elements appear to remain intact with underlying chronic T3-T4 facet ankylosis which was present in 2016. T1 and T2 appear stable and intact. T4 through T12 appear stable and intact. a un healed posterior 7th rib fracture is new since 2016. Although there does appear to be some new periosteal bone formation there. Similar appearance of the posterior left 6th and 7th ribs at the costovertebral junctions. No other posterior  rib fracture identified. Paraspinal and other soft tissues: Thoracic paraspinal soft tissues remain within normal limits. Chest and abdomen are reported separately today. Disc levels: Mild for age thoracic spine degeneration, with no CT evidence of significant spinal stenosis outside of the T3 level described above. IMPRESSION: 1. Severe T3 compression fracture with vertebra plana and 5 mm retropulsion of the posteroinferior endplate resulting in mild spinal stenosis. This is age indeterminate. 2. No other acute traumatic injury identified in the thoracic spine. 3. Subacute appearing comminuted fracture of the left posterior 7th rib, and the left 6th and 7th rib costovertebral junctions. 4.  CT Chest, Abdomen, and Pelvis today are reported separately. Electronically Signed   By: Genevie Ann M.D.   On: 08/25/2020 12:08   CT L-SPINE NO CHARGE  Result Date: 08/04/2020 CLINICAL DATA:  87 year old female status post fall. EXAM: CT LUMBAR SPINE WITH CONTRAST TECHNIQUE: Technique: Multiplanar CT images of the lumbar spine were reconstructed from contemporary CT of the Abdomen and Pelvis. CONTRAST:  No additional COMPARISON:  Thoracic spine CT today and CT Chest, Abdomen, and Pelvis today are reported separately. MRI lumbar spine 08/08/2018. FINDINGS: Segmentation: Normal, concordant with the thoracic spine numbering today. Alignment: Chronic levoconvex thoracic scoliosis and spondylolisthesis at L4-L5 (grade 1 and L5-S1 (grade 2) appears stable since 2020. Vertebrae: Diffuse osteopenia. Visible lower thoracic levels and lumbar vertebrae appear intact. Mild chronic endplate deformity inferiorly at  L5 is stable. Previous sacroplasty. No recurrent sacral fracture is evident. Paraspinal and other soft tissues: Abdomen and pelvis are reported separately today. Lumbar paraspinal soft tissues remain within normal limits. Disc levels: Stable lumbar spine degeneration since 2020, with only mild lower lumbar spinal stenosis despite  the chronic spondylolisthesis. IMPRESSION: 1. No acute traumatic injury identified in the lumbar spine. Lumbar spine spondylolisthesis and degeneration appears stable from a 2020 MRI. 2.  CT Chest, Abdomen, and Pelvis today are reported separately. Electronically Signed   By: Genevie Ann M.D.   On: 08/27/2020 12:11   DG Chest Port 1 View  Result Date: 08/23/2020 CLINICAL DATA:  87 year old female status post fall. Altered mental status. EXAM: PORTABLE CHEST 1 VIEW COMPARISON:  Chest radiographs 05/08/2020 and earlier. FINDINGS: Portable AP upright view at 0905 hours. Chronic left lung volume loss and architectural distortion. Stable cardiac size and mediastinal contours. Calcified aortic atherosclerosis. Visualized tracheal air column is within normal limits. The right lung remains clear when allowing for portable technique. Degenerative osseous changes at the [facility] joints. No acute osseous abnormality identified. Negative visible bowel gas pattern. IMPRESSION: No acute cardiopulmonary abnormality. Stable chronic left lung disease Electronically Signed   By: Genevie Ann M.D.   On: 08/25/2020 09:38        Scheduled Meds: . bisacodyl  10 mg Rectal Daily  . cycloSPORINE  1 drop Both Eyes BID  . famotidine  20 mg Oral Daily  . feeding supplement  237 mL Oral BID BM  . hydroxychloroquine  200 mg Oral QODAY  . levothyroxine  112 mcg Oral QAC breakfast  . lidocaine  1 patch Transdermal Q24H  . melatonin  10 mg Oral Nightly  . polyethylene glycol  17 g Oral Daily  . QUEtiapine  12.5 mg Oral BID  . senna-docusate  1 tablet Oral BID  . sulfaSALAzine  500 mg Oral BID   Continuous Infusions: . sodium chloride 75 mL/hr at 08/29/20 1734     LOS: 1 day    Time spent: 35 minutes    Irine Seal, MD [facility]   To contact the attending provider between 7A-7P or the covering provider during after hours 7P-7A, please log into the web site [email or web address] and access using universal [place]  password for that web site. If you do not have the password, please call the hospital operator.  08/29/2020, 6:39 PM

## 2020-08-29 NOTE — Progress Notes (Signed)
OT Cancellation Note  Patient Details Name: Emma Kennedy MRN: [id number] DOB: 1929/08/05   Cancelled Treatment:    Reason Eval/Treat Not Completed: Fatigue/lethargy limiting ability to participate. Orders received and chart reviewed. Upon arrival to room, pt sound asleep. Per discussion with RN, pt restless overnight and requesting OT to re-attempt eval when pt awake. OT to eval and initiate OT services at later time/date.  Fredirick Maudlin, OTR/L [facility]

## 2020-08-29 NOTE — TOC Initial Note (Signed)
Transition of Care [facility]) - Initial/Assessment Note    Patient Details  Name: Emma Kennedy MRN: [id number] Date of Birth: [birth year]  Transition of Care [facility]) CM/SW Contact:    Magnus Ivan, LCSW Phone Number: 08/29/2020, 9:54 AM  Clinical Narrative:                Per chart, patient has memory impairment. CSW spoke with patient's daughter [name] for high risk assessment. Patient has lived at [facility] since January. PCP is the NP at the ALF. Patient uses a RW. Patient has had HHPT at the ALF in the past. Went to [facility] in the past. Family provides transport to appointments. Patient has had 2 COVID vaccines plus the booster.    Expected Discharge Plan: Assisted Living Barriers to Discharge: Continued Medical Work up   Patient Goals and CMS Choice Patient states their goals for this hospitalization and ongoing recovery are:: back to ALF CMS Medicare.gov Compare Post Acute Care list provided to:: Patient Represenative (must comment) Choice offered to / list presented to : Adult Children  Expected Discharge Plan and Services Expected Discharge Plan: Assisted Living       Living arrangements for the past 2 months: [place]                                      Prior Living Arrangements/Services Living arrangements for the past 2 months: [place] Lives with:: Facility Resident Patient language and need for interpreter reviewed:: Yes Do you feel safe going back to the place where you live?: Yes      Need for Family Participation in Patient Care: Yes (Comment) Care giver support system in place?: Yes (comment) Current home services: DME Criminal Activity/Legal Involvement Pertinent to Current Situation/Hospitalization: No - Comment as needed  Activities of Daily Living Home Assistive Devices/Equipment: Walker (specify type),Shower chair without back ADL Screening (condition at time of admission) Patient's cognitive ability adequate to  safely complete daily activities?: No Is the patient deaf or have difficulty hearing?: No Does the patient have difficulty seeing, even when wearing glasses/contacts?: No Does the patient have difficulty concentrating, remembering, or making decisions?: Yes Patient able to express need for assistance with ADLs?: No Does the patient have difficulty dressing or bathing?: Yes Independently performs ADLs?: No Communication: Independent Dressing (OT): Needs assistance Is this a change from baseline?: Pre-admission baseline Grooming: Needs assistance Is this a change from baseline?: Change from baseline, expected to last <3 days Feeding: Independent Bathing: Needs assistance Is this a change from baseline?: Pre-admission baseline Toileting: Needs assistance Is this a change from baseline?: Change from baseline, expected to last >3days In/Out Bed: Independent Walks in Home: Independent Does the patient have difficulty walking or climbing stairs?: Yes Weakness of Legs: Both Weakness of Arms/Hands: Both  Permission Sought/Granted Permission sought to share information with : Facility Art therapist granted to share information with : Yes, Verbal Permission Granted (by daughter- patient memory impairment)     Permission granted to share info w AGENCY: [facility], DME, SNF as needed        Emotional Assessment       Orientation: : Oriented to Self,Oriented to Place,Oriented to  Time,Oriented to Situation Alcohol / Substance Use: Not Applicable Psych Involvement: No (comment)  Admission diagnosis:  Trauma [T14.90XA] Lung mass [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 Acute  encephalopathy 2020-09-15  . Acute metabolic encephalopathy 16/01/9603  . Generalized weakness 09/15/2020  . Acute hyponatremia 09/15/20  . Dehydration Sep 15, 2020  . Prolonged QT interval 09/15/2020  . Scalp laceration   . Closed left hip fracture ([place]) 02/07/2020  . Syncope 02/07/2020  . Dementia without behavioral disturbance ([place]) 02/07/2020  . RA (rheumatoid arthritis) ([place])   . Normocytic anemia   . Hypothyroidism   . Fall    PCP:  Adin Hector, MD Pharmacy:   [place], [place] - [address] [place] [address] Phone: [phone] Fax: [phone]     Social Determinants of Health (SDOH) Interventions    Readmission Risk Interventions Readmission Risk Prevention Plan 08/29/2020  Transportation Screening Complete  PCP or Specialist Appt within 3-5 Days Complete  HRI or [place] Complete  Social Work Consult for [place] Planning/Counseling Complete  Palliative Care Screening Not Applicable  Medication Review Press photographer) Complete  Some recent data might be hidden

## 2020-08-29 DEATH — deceased

## 2020-08-30 DIAGNOSIS — S2242XA Multiple fractures of ribs, left side, initial encounter for closed fracture: Secondary | ICD-10-CM

## 2020-08-30 DIAGNOSIS — Z515 Encounter for palliative care: Secondary | ICD-10-CM

## 2020-08-30 DIAGNOSIS — Z66 Do not resuscitate: Secondary | ICD-10-CM

## 2020-08-30 DIAGNOSIS — Z7189 Other specified counseling: Secondary | ICD-10-CM

## 2020-08-30 LAB — CBC WITH DIFFERENTIAL/PLATELET
Abs Immature Granulocytes: 0.03 10*3/uL (ref 0.00–0.07)
Basophils Absolute: 0.1 10*3/uL (ref 0.0–0.1)
Basophils Relative: 1 %
Eosinophils Absolute: 0.1 10*3/uL (ref 0.0–0.5)
Eosinophils Relative: 1 %
HCT: 33.7 % — ABNORMAL LOW (ref 36.0–46.0)
Hemoglobin: 11.2 g/dL — ABNORMAL LOW (ref 12.0–15.0)
Immature Granulocytes: 0 %
Lymphocytes Relative: 12 %
Lymphs Abs: 1 10*3/uL (ref 0.7–4.0)
MCH: 30.7 pg (ref 26.0–34.0)
MCHC: 33.2 g/dL (ref 30.0–36.0)
MCV: 92.3 fL (ref 80.0–100.0)
Monocytes Absolute: 0.7 10*3/uL (ref 0.1–1.0)
Monocytes Relative: 9 %
Neutro Abs: 6.2 10*3/uL (ref 1.7–7.7)
Neutrophils Relative %: 77 %
Platelets: 308 10*3/uL (ref 150–400)
RBC: 3.65 MIL/uL — ABNORMAL LOW (ref 3.87–5.11)
RDW: 12.7 % (ref 11.5–15.5)
WBC: 8 10*3/uL (ref 4.0–10.5)
nRBC: 0 % (ref 0.0–0.2)

## 2020-08-30 LAB — BASIC METABOLIC PANEL
Anion gap: 9 (ref 5–15)
BUN: 17 mg/dL (ref 8–23)
CO2: 25 mmol/L (ref 22–32)
Calcium: 9 mg/dL (ref 8.9–10.3)
Chloride: 98 mmol/L (ref 98–111)
Creatinine, Ser: 0.49 mg/dL (ref 0.44–1.00)
GFR, Estimated: >60 mL/min (ref >60)
Glucose, Bld: 90 mg/dL (ref 70–99)
Potassium: 3.6 mmol/L (ref 3.5–5.1)
Sodium: 132 mmol/L — ABNORMAL LOW (ref 135–145)

## 2020-08-30 LAB — MAGNESIUM: Magnesium: 1.7 mg/dL (ref 1.7–2.4)

## 2020-08-30 MED ORDER — MAGNESIUM SULFATE 2 GM/50ML IV SOLN
2.0000 g | Freq: Once | INTRAVENOUS | Status: AC
  Administered 2020-08-30: 2 g via INTRAVENOUS
  Filled 2020-08-30: qty 50

## 2020-08-30 MED ORDER — GLYCOPYRROLATE 0.2 MG/ML IJ SOLN
0.2000 mg | INTRAMUSCULAR | Status: DC | PRN
  Filled 2020-08-30 (×2): qty 1

## 2020-08-30 MED ORDER — BIOTENE DRY MOUTH MT LIQD: 15.0000 mL | OROMUCOSAL | Status: DC | PRN

## 2020-08-30 MED ORDER — BISACODYL 10 MG RE SUPP: 10.0000 mg | Freq: Every day | RECTAL | Status: DC | PRN

## 2020-08-30 MED ORDER — POLYVINYL ALCOHOL 1.4 % OP SOLN
1.0000 [drp] | Freq: Four times a day (QID) | OPHTHALMIC | Status: DC | PRN
  Filled 2020-08-30: qty 15

## 2020-08-30 MED ORDER — FENTANYL CITRATE (PF) 100 MCG/2ML IJ SOLN: 25.0000 ug | INTRAMUSCULAR | Status: DC | PRN

## 2020-08-30 MED ORDER — LORAZEPAM 2 MG/ML IJ SOLN
0.2500 mg | INTRAMUSCULAR | Status: DC | PRN
Start: 2020-08-30 — End: 2020-08-31

## 2020-08-30 NOTE — Progress Notes (Addendum)
[place] Room [facility]) Hospital Liaison RN note:  Received request from Jobe Gibbon for family interest in [facility]. Chart reviewed and eligibility was approved. Spoke with daughter, [name] to confirm interest and explain services. She verbalized understanding. Unfortunately, [facility] is not able to offer a room today. Hospital Care team is aware. [place] Liaison will continue to follow for room availability.  Please call with any hospice related questions or concerns.  Thank you for the opportunity to participate in this patient's care.  Zandra Abts, RN [facility] Liaison [phone]

## 2020-08-30 NOTE — Progress Notes (Signed)
PROGRESS NOTE    Emma Kennedy  RKY:[id number] DOB: 1929-07-23 DOA: 08/22/2020 PCP: Adin Hector, MD   Chief Complaint  Patient presents with  . Fall  . Altered Mental Status    Brief Narrative:  Patient is a 87 year old female history of dementia, hypothyroidism, RA, chronic interstitial lung disease presented to the ED with acute metabolic encephalopathy after presenting from independent living facility for evaluation of altered mental status. CT head done negative.  CT chest abdomen and pelvis done concerning for lung mass.  Infectious work-up negative to date.   Assessment & Plan:   Principal Problem:   Acute metabolic encephalopathy Active Problems:   RA (rheumatoid arthritis) (HCC)   Hypothyroidism   Generalized weakness   Acute hyponatremia   Dehydration   Prolonged QT interval   Closed wedge compression fracture of third thoracic vertebra (HCC)   Lung mass   Constipation  1 acute metabolic encephalopathy -Patient presenting with worsening somnolence/lethargy which per admitting physician abruptly started 2 weeks prior to admission versus progressive decline. -Patient with history of underlying advanced dementia. -Patient noted to be in an independent living facility prior to onset of symptoms. -CT head done negative for any acute abnormalities. -Infectious work-up negative to date. -Urinalysis clear, nitrite negative, leukocytes negative, 0-5 WBC. -Chest x-ray done negative for any acute abnormalities. -CT chest/abdomen/pelvis done with new 3.6 x 5.6 cm consolidation/mass within the anterior mid left upper lobe suspicious for malignancy, infection less likely, trace left pleural effusion, severe T3 compression fracture age-indeterminate, no evidence of acute injury in the abdomen or pelvis, large amount of stool within the distal colon and rectum, chronic interstitial lung disease. -TSH within normal limits. -Patient also noted to be dehydrated on  examination. -Patient being hydrated with IV fluids. -Continue current bowel regimen due to constipation noted on CT abdomen and pelvis. -Try to minimize sedating medications. -Patient Cymbalta, mirtazapine, gabapentin on hold. -Supportive care. -Palliative care consultation pending  2.  Generalized weakness -Per family patient with generalized weakness associated with worsening somnolence/lethargy. -Concerned that patient may need a higher level of care. -Infectious work-up negative to date. -CT head negative. -PT/OT. -Palliative care evaluation/consultation pending.  3.  Hyponatremia -Likely secondary to hypovolemic hyponatremia secondary to poor oral intake. -Improved with hydration. -Follow.  4.  LUL lung mass -3.6 x 5.6 cm mass noted in LUL on CT chest suspicious for malignancy. -Admitting physician spoke with patient's daughter, POA, who conveyed that patient would not want any histopathological identification of the mass nor want any further evaluation nor want any life prolonging measures or interventions if it relates to suspected malignancy. -Family seems to be leaning towards full comfort measures at this time.   -Palliative care consultation pending.    5.  Prolonged QTc  Interval -Repeat EKG with resolution of QTC prolongation.  6.  Advanced dementia with behavioral disturbance -Patient noted to have significant agitation on 08/29/2020 had to be given IV Ativan for sedation.   -Patient started on Seroquel 12.5 mg twice daily with improvement with agitation.   -Continue current dose of IV Ativan as needed.   -Family leaning towards comfort measures at this time.   -Palliative care consultation pending  7.  Hypothyroidism -TSH within normal limits -Synthroid.  8.  Rheumatoid arthritis -Continue sulfasalazine and Plaquenil pending palliative care evaluation. -Outpatient follow-up.  9.  GERD -Pepcid.   10.  Constipation -Noted on CT abdomen and  pelvis -Continue current bowel regimen of Dulcolax suppository, Senokot-S twice daily, MiraLAX  daily.  11.  T3 compression fracture -Noted on CT.  Age-indeterminate. -PT/OT. -Pain management.     DVT prophylaxis: SCDs Code Status: DNR Family Communication: Updated daughter (POA) at bedside Disposition:   Status is: Inpatient    Dispo: The patient is from: Independent living/ALF              Anticipated d/c is to: To be determined              Patient currently on IV fluids, palliative care consultation pending. Not stable for discharge   Difficult to place patient no       Consultants:   Palliative care pending  Procedures:   CT head/CT C-spine 08/04/2020  CT chest abdomen and pelvis 08/20/2020  Chest x-ray 08/08/2020  Antimicrobials:   None   Subjective: Patient laying in bed alert.  Mittens on.  Denies any chest pain or shortness of breath.  No abdominal pain.  Daughter at bedside who states Seroquel helped with patient's agitation overnight.  Daughter at bedside stating family leaning towards comfort measures.  Objective: Vitals:   08/29/20 2348 08/30/20 0424 08/30/20 0700 08/30/20 0920  BP: (!) 162/72 (!) 163/115  132/77  Pulse: 85 89  75  Resp: 20 18  18   Temp: 97.8 F (36.6 C) 99.5 F (37.5 C)  97.8 F (36.6 C)  TempSrc: Oral     SpO2: 98% 90%  98%  Weight:   38 kg   Height:        Intake/Output Summary (Last 24 hours) at 08/30/2020 1043 Last data filed at 08/30/2020 0900 Gross per 24 hour  Intake 1600.69 ml  Output --  Net 1600.69 ml   Filed Weights   08/20/2020 0907 08/29/20 0500 08/30/20 0700  Weight: 38 kg 39.1 kg 38 kg    Examination:  General exam: Alert.  Mittens. Respiratory system: CTA B anterior lung fields.  No wheezes, no crackles, no rhonchi.   Cardiovascular system: Regular rate rhythm no murmurs rubs or gallops.  No JVD.  No lower extremity edema.  Gastrointestinal system: Abdomen is soft, nontender, nondistended, positive  bowel sounds.  No rebound.  No guarding.   Central nervous system: Alert.  Moving extremities spontaneously.  No focal neurological deficits. Extremities: Symmetric 5 x 5 power. Skin: No rashes, lesions or ulcers Psychiatry: Judgement and insight appear poor. Mood & affect appropriate.     Data Reviewed: I have personally reviewed following labs and imaging studies  CBC: Recent Labs  Lab 08/25/2020 0917 08/29/20 0517 08/30/20 0416  WBC 8.8 10.1 8.0  NEUTROABS  --   --  6.2  HGB 12.4 11.8* 11.2*  HCT 37.9 35.4* 33.7*  MCV 94.3 92.4 92.3  PLT 371 332 [id number]    Basic Metabolic Panel: Recent Labs  Lab 08/07/2020 0917 08/29/20 0517 08/30/20 0416  NA 132* 137 132*  K 4.3 4.1 3.6  CL 97* 99 98  CO2 26 28 25   GLUCOSE 135* 86 90  BUN 19 27* 17  CREATININE 0.74 0.62 0.49  CALCIUM 9.6 9.5 9.0  MG 2.2 1.9 1.7    GFR: Estimated Creatinine Clearance: 28.6 mL/min (by C-G formula based on SCr of 0.49 mg/dL).  Liver Function Tests: Recent Labs  Lab 08/15/2020 0917 08/29/20 0517  AST 26 25  ALT 13 13  ALKPHOS 84 71  BILITOT 0.6 0.6  PROT 6.4* 6.5  ALBUMIN 3.4* 3.5    CBG: No results for input(s): GLUCAP in the last 168 hours.   Recent  Results (from the past 240 hour(s))  Resp Panel by RT-PCR (Flu A&B, Covid) Nasopharyngeal Swab     Status: None   Collection Time: 08/05/2020  9:17 AM   Specimen: Nasopharyngeal Swab; Nasopharyngeal(NP) swabs in vial transport medium  Result Value Ref Range Status   SARS Coronavirus 2 by RT PCR NEGATIVE NEGATIVE Final    Comment: (NOTE) SARS-CoV-2 target nucleic acids are NOT DETECTED.  The SARS-CoV-2 RNA is generally detectable in upper respiratory specimens during the acute phase of infection. The lowest concentration of SARS-CoV-2 viral copies this assay can detect is 138 copies/mL. A negative result does not preclude SARS-Cov-2 infection and should not be used as the sole basis for treatment or other patient management decisions. A  negative result may occur with  improper specimen collection/handling, submission of specimen other than nasopharyngeal swab, presence of viral mutation(s) within the areas targeted by this assay, and inadequate number of viral copies(<138 copies/mL). A negative result must be combined with clinical observations, patient history, and epidemiological information. The expected result is Negative.  Fact Sheet for Patients:  EntrepreneurPulse.com.au  Fact Sheet for Healthcare Providers:  IncredibleEmployment.be  This test is no t yet approved or cleared by the [place] FDA and  has been authorized for detection and/or diagnosis of SARS-CoV-2 by FDA under an Emergency Use Authorization (EUA). This EUA will remain  in effect (meaning this test can be used) for the duration of the COVID-19 declaration under Section 564(b)(1) of the Act, 21 U.S.C.section 360bbb-3(b)(1), unless the authorization is terminated  or revoked sooner.       Influenza A by PCR NEGATIVE NEGATIVE Final   Influenza B by PCR NEGATIVE NEGATIVE Final    Comment: (NOTE) The Xpert Xpress SARS-CoV-2/FLU/RSV plus assay is intended as an aid in the diagnosis of influenza from Nasopharyngeal swab specimens and should not be used as a sole basis for treatment. Nasal washings and aspirates are unacceptable for Xpert Xpress SARS-CoV-2/FLU/RSV testing.  Fact Sheet for Patients: EntrepreneurPulse.com.au  Fact Sheet for Healthcare Providers: IncredibleEmployment.be  This test is not yet approved or cleared by the [place] FDA and has been authorized for detection and/or diagnosis of SARS-CoV-2 by FDA under an Emergency Use Authorization (EUA). This EUA will remain in effect (meaning this test can be used) for the duration of the COVID-19 declaration under Section 564(b)(1) of the Act, 21 U.S.C. section 360bbb-3(b)(1), unless the authorization  is terminated or revoked.  Performed at [facility], [address] [place], [place] [address]   Blood culture (routine single)     Status: None (Preliminary result)   Collection Time: 08/10/2020  9:17 AM   Specimen: BLOOD  Result Value Ref Range Status   Specimen Description BLOOD LEFT ANTECUBITAL  Final   Special Requests   Final    BOTTLES DRAWN AEROBIC AND ANAEROBIC Blood Culture results may not be optimal due to an inadequate volume of blood received in culture bottles   Culture   Final    NO GROWTH 2 DAYS Performed at [facility], [address], [place], [place] [address]    Report Status PENDING  Incomplete  Urine culture     Status: None   Collection Time: 07/31/2020  9:28 AM   Specimen: Urine, Random  Result Value Ref Range Status   Specimen Description   Final    URINE, RANDOM Performed at [facility], [address], [place], [place] [address]    Special Requests   Final  NONE Performed at [facility], [address] [place], [place] [address]    Culture   Final    NO GROWTH Performed at [facility], [place] [address] [place], [place] [address]    Report Status 08/29/2020 FINAL  Final  MRSA PCR Screening     Status: None   Collection Time: 08/29/20  8:11 PM   Specimen: Nasal Mucosa; Nasopharyngeal  Result Value Ref Range Status   MRSA by PCR NEGATIVE NEGATIVE Final    Comment:        The GeneXpert MRSA Assay (FDA approved for NASAL specimens only), is one component of a comprehensive MRSA colonization surveillance program. It is not intended to diagnose MRSA infection nor to guide or monitor treatment for MRSA infections. Performed at [facility], [address] [place],  [address]          Radiology Studies: CT Head Wo Contrast  Result Date: 08/15/2020 CLINICAL DATA:  87 year old female status post fall. EXAM: CT HEAD WITHOUT CONTRAST TECHNIQUE: Contiguous axial images were  obtained from the base of the skull through the vertex without intravenous contrast. COMPARISON:  Brain MRI 02/07/2020.  Head CT 02/07/2020. FINDINGS: Brain: Cerebral volume not significantly changed. No midline shift, ventriculomegaly, mass effect, evidence of mass lesion, intracranial hemorrhage or evidence of cortically based acute infarction. Patchy and confluent bilateral white matter hypodensity appears stable since last year. No cortical encephalomalacia identified. Vascular: Advanced Calcified atherosclerosis at the skull base. No suspicious intracranial vascular hyperdensity. Skull: Stable, intact. Sinuses/Orbits: Visualized paranasal sinuses and mastoids are stable and well aerated. Other: No orbit or scalp soft tissue injury identified today. IMPRESSION: Stable. No acute intracranial abnormality or acute traumatic injury identified. Electronically Signed   By: Genevie Ann M.D.   On: 08/23/2020 11:56   CT Cervical Spine Wo Contrast  Result Date: 08/09/2020 CLINICAL DATA:  87 year old female status post fall. EXAM: CT CERVICAL SPINE WITHOUT CONTRAST TECHNIQUE: Multidetector CT imaging of the cervical spine was performed without intravenous contrast. Multiplanar CT image reconstructions were also generated. COMPARISON:  CT cervical spine 02/07/2020. Head CT today reported separately. FINDINGS: Alignment: Preserved cervical lordosis. Cervicothoracic junction alignment is within normal limits. Bilateral posterior element alignment is within normal limits. Skull base and vertebrae: Osteopenia. Visualized skull base is intact. No atlanto-occipital dissociation. Preserved C1-C2 alignment. No acute osseous abnormality identified. Soft tissues and spinal canal: No prevertebral fluid or swelling. No visible canal hematoma. Stable and negative noncontrast visible neck soft tissues. Disc levels: Chronically ankylosed right side C4-C5 facets. Widespread chronic cervical disc calcification. But otherwise mild for age  cervical spine degeneration. No significant spinal stenosis suspected. Upper chest: Reported separately today. IMPRESSION: 1. No acute traumatic injury identified in the cervical spine. Mild for age cervical degeneration. 2. See Thoracic Spine CT today reported separately. Electronically Signed   By: Genevie Ann M.D.   On: 08/24/2020 11:59   CT CHEST ABDOMEN PELVIS W CONTRAST  Result Date: 08/19/2020 CLINICAL DATA:  87 year old female with fall and chest, abdominal and pelvic pain. Altered mental status. EXAM: CT CHEST, ABDOMEN, AND PELVIS WITH CONTRAST TECHNIQUE: Multidetector CT imaging of the chest, abdomen and pelvis was performed following the standard protocol during bolus administration of intravenous contrast. CONTRAST:  61mL OMNIPAQUE IOHEXOL 300 MG/ML  SOLN COMPARISON:  04/28/2015 high-resolution chest CT and 10/24/2011 abdominal/pelvic CT FINDINGS: CT CHEST FINDINGS Cardiovascular: Normal heart size. Heavy coronary artery atherosclerotic calcifications are again noted. Thoracic aortic atherosclerotic calcifications noted without aneurysm. No pericardial  effusion. Mediastinum/Nodes: No enlarged mediastinal, hilar, or axillary lymph nodes. Thyroid gland, trachea, and esophagus demonstrate no significant findings. Lungs/Pleura: New 3.6 x 5.6 cm consolidation/mass within the anterior mid LEFT UPPER lobe noted with heterogeneous enhancement. A trace LEFT pleural effusion is noted. Chronic interstitial changes/interlobular septal thickening with mild biapical pleuroparenchymal scarring again noted. LEFT hemithorax volume loss is unchanged. No pneumothorax. Musculoskeletal: Severe T3 compression fracture with vertebra plana and 5 mm retropulsion of the posteroinferior endplate is age indeterminate. Fractures of the posterior LEFT 6th and 7th ribs appear subacute. CT ABDOMEN PELVIS FINDINGS Hepatobiliary: The liver and gallbladder are unremarkable. Minimal fullness of the CBD/intrahepatic biliary system noted.  Pancreas: Unremarkable Spleen: No significant abnormality Adrenals/Urinary Tract: No acute renal abnormalities. Small areas of renal scarring again identified. Adrenal glands and bladder are within normal limits. Stomach/Bowel: A large amount of stool noted within the distal colon and rectum. No dilated small bowel loops are identified. No definite bowel wall thickening or inflammatory changes are noted. Vascular/Lymphatic: Aortic atherosclerosis. No enlarged abdominal or pelvic lymph nodes. Reproductive: Status post hysterectomy. No adnexal masses. Other: No ascites, focal collection or pneumoperitoneum. Musculoskeletal: No acute or suspicious bony abnormalities are noted. Multilevel degenerative changes within the lumbar spine and L5-S1 spondylolisthesis again noted. IMPRESSION: 1. New 3.6 x 5.6 cm consolidation/mass within the anterior mid LEFT UPPER lobe - suspicious for malignancy, with infection less likely. Trace LEFT pleural effusion. 2. Severe T3 compression fracture with vertebra plana and 5 mm retropulsion of the posteroinferior endplate, age indeterminate. 3. Fractures of the posterior LEFT 6th and 7th ribs - appear subacute. 4. No evidence of acute injury within the abdomen or pelvis. 5. Large amount of stool within the distal colon and rectum. 6. Chronic interstitial lung disease. 7. Coronary artery disease 8. Aortic atherosclerosis (ICD10-I70.0). Electronically Signed   By: Margarette Canada M.D.   On: 08/27/2020 12:32   CT T-SPINE NO CHARGE  Result Date: 08/15/2020 CLINICAL DATA:  87 year old female status post fall. EXAM: CT THORACIC SPINE WITH CONTRAST TECHNIQUE: Multiplanar CT images of the thoracic spine were reconstructed from contemporary CT of the Chest. CONTRAST:  No additional COMPARISON:  Cervical spine CT and CT Chest, Abdomen, and Pelvis today are reported separately. Chest CT 04/28/2015. Chest and rib series 05/08/2020. FINDINGS: Limited cervical spine imaging: Reported separately today.  Cervicothoracic junction alignment is within normal limits. Thoracic spine segmentation:  Normal. Alignment: Abnormally exaggerated upper thoracic kyphosis related to the T3 finding. Relatively normal thoracic vertebral height and alignment elsewhere. Vertebrae: Osteopenia. Severe T3 compression fracture with vertebra plana (series 5, image 27). This is new since 2016, unclear whether this was present in January. The compressed vertebral body is sclerotic. Retropulsion of the posteroinferior endplate up to 5 mm, although the bony AP spinal canal remains about 8 mm none the less (series 6, image 81). T3 pedicles and posterior elements appear to remain intact with underlying chronic T3-T4 facet ankylosis which was present in 2016. T1 and T2 appear stable and intact. T4 through T12 appear stable and intact. a un healed posterior 7th rib fracture is new since 2016. Although there does appear to be some new periosteal bone formation there. Similar appearance of the posterior left 6th and 7th ribs at the costovertebral junctions. No other posterior rib fracture identified. Paraspinal and other soft tissues: Thoracic paraspinal soft tissues remain within normal limits. Chest and abdomen are reported separately today. Disc levels: Mild for age thoracic spine degeneration, with no CT evidence of significant spinal stenosis  outside of the T3 level described above. IMPRESSION: 1. Severe T3 compression fracture with vertebra plana and 5 mm retropulsion of the posteroinferior endplate resulting in mild spinal stenosis. This is age indeterminate. 2. No other acute traumatic injury identified in the thoracic spine. 3. Subacute appearing comminuted fracture of the left posterior 7th rib, and the left 6th and 7th rib costovertebral junctions. 4.  CT Chest, Abdomen, and Pelvis today are reported separately. Electronically Signed   By: Genevie Ann M.D.   On: 08/16/2020 12:08   CT L-SPINE NO CHARGE  Result Date: 08/11/2020 CLINICAL  DATA:  87 year old female status post fall. EXAM: CT LUMBAR SPINE WITH CONTRAST TECHNIQUE: Technique: Multiplanar CT images of the lumbar spine were reconstructed from contemporary CT of the Abdomen and Pelvis. CONTRAST:  No additional COMPARISON:  Thoracic spine CT today and CT Chest, Abdomen, and Pelvis today are reported separately. MRI lumbar spine 08/08/2018. FINDINGS: Segmentation: Normal, concordant with the thoracic spine numbering today. Alignment: Chronic levoconvex thoracic scoliosis and spondylolisthesis at L4-L5 (grade 1 and L5-S1 (grade 2) appears stable since 2020. Vertebrae: Diffuse osteopenia. Visible lower thoracic levels and lumbar vertebrae appear intact. Mild chronic endplate deformity inferiorly at L5 is stable. Previous sacroplasty. No recurrent sacral fracture is evident. Paraspinal and other soft tissues: Abdomen and pelvis are reported separately today. Lumbar paraspinal soft tissues remain within normal limits. Disc levels: Stable lumbar spine degeneration since 2020, with only mild lower lumbar spinal stenosis despite the chronic spondylolisthesis. IMPRESSION: 1. No acute traumatic injury identified in the lumbar spine. Lumbar spine spondylolisthesis and degeneration appears stable from a 2020 MRI. 2.  CT Chest, Abdomen, and Pelvis today are reported separately. Electronically Signed   By: Genevie Ann M.D.   On: 08/24/2020 12:11        Scheduled Meds: . bisacodyl  10 mg Rectal Daily  . cycloSPORINE  1 drop Both Eyes BID  . famotidine  20 mg Oral Daily  . feeding supplement  237 mL Oral BID BM  . hydroxychloroquine  200 mg Oral QODAY  . levothyroxine  112 mcg Oral QAC breakfast  . lidocaine  1 patch Transdermal Q24H  . melatonin  10 mg Oral Nightly  . polyethylene glycol  17 g Oral Daily  . QUEtiapine  12.5 mg Oral BID  . senna-docusate  1 tablet Oral BID  . sulfaSALAzine  500 mg Oral BID   Continuous Infusions: . sodium chloride 75 mL/hr at 08/30/20 1033  . magnesium  sulfate bolus IVPB 2 g (08/30/20 1037)     LOS: 2 days    Time spent: 35 minutes    Irine Seal, MD [facility]   To contact the attending provider between 7A-7P or the covering provider during after hours 7P-7A, please log into the web site [email or web address] and access using universal  password for that web site. If you do not have the password, please call the hospital operator.  08/30/2020, 10:43 AM

## 2020-08-30 NOTE — Progress Notes (Signed)
Updated daughter  Earleen Reaper about patient's condition. Patient slept most of the shift without any issues. Patient awaken to take medications. Patient did not try to elope from bed. Mittens are in place. Will continue to monitor and assess.

## 2020-08-30 NOTE — TOC Progression Note (Addendum)
Transition of Care [facility]) - Progression Note    Patient Details  Name: Emma Kennedy MRN: [id number] Date of Birth: 03-03-1930  Transition [facility]) CM/SW Contact  Beverly Sessions, RN Phone Number: 08/30/2020, 12:38 PM  Clinical Narrative:    PT and OT recommending home health. Spoke with [name] at [facility] When patient is ready to return fax DC summary, FL2 and home health order to [phone].  They will arrange PT and OT services through their onsite provider Babs Bertin    Update:  Palliative NP made referral to [facility] with Manufacturing engineer for residential hospice    Expected Discharge Plan: Assisted Living Barriers to Discharge: Continued Medical Work up  Expected Discharge Plan and Services Expected Discharge Plan: Assisted Living       Living arrangements for the past 2 months: Assisted Living Facility                                       Social Determinants of Health (SDOH) Interventions    Readmission Risk Interventions Readmission Risk Prevention Plan 08/29/2020  Transportation Screening Complete  PCP or Specialist Appt within 3-5 Days Complete  HRI or [place] Complete  Social Work Consult for [place] Planning/Counseling Complete  Palliative Care Screening Not Applicable  Medication Review Press photographer) Complete  Some recent data might be hidden

## 2020-08-30 NOTE — Consult Note (Signed)
Consultation Note Date: 08/30/2020   Patient Name: Emma Kennedy  DOB: July 22, 1929  MRN: [id number]  Age / Sex: 87 y.o., female   PCP: Adin Hector, MD Referring Physician: Eugenie Filler, MD   REASON FOR CONSULTATION:Establishing goals of care  Palliative Care consult requested for goals of care discussion in this 87 y.o. female with a medical history significant for menstrual, rheumatoid arthritis, interstitial lung disease, osteoporosis, and hypothyroidism.  Patient presented to the ED from [facility] facility with concerns of altered mental status.  Recent CT chest showed new 3.6 x 5.6 left upper lobe mass suspicious for malignancy.  Family has declined work-up.  Clinical Assessment and Goals of Care: I have reviewed medical records including lab results, imaging, Epic notes, and MAR, received report from the bedside RN, and assessed the patient.   I spoke with patient's daughter Emma Basta [facility]) to discuss diagnosis prognosis, [place], EOL wishes, disposition and options.  Ms. Steidle is somnolent. Unable to assess mentation.   I introduced Palliative Medicine as specialized medical care for people living with serious illness. It focuses on providing relief from the symptoms and stress of a serious illness. The goal is to improve quality of life for both the patient and the family.   We discussed a brief life review of the patient, along with her functional and nutritional status.  Patient has 2 daughters and is widowed.  Daughter shares over the past month patient has been sleeping more than she has been awake, increased confusion with some hallucinations.  Her appetite has been poor with an unknown amount of weight loss.  She shows no interest in eating and when awake and ask she states she has no appetite or desire to eat.  Daughter states patient recently had a fall with the CNA present while attempting to walk.  She became suddenly weak and legs gave out.  We discussed Her  current illness and what it means in the larger context of Her on-going co-morbidities. Natural disease trajectory and expectations at EOL were discussed.  Emma Basta is tearful acknowledging her mother's current illness and continued decline.  He shares her mother's quality of life is poor and both she and her sister have mutually agreed they do not want to continue to see her this way.  Motional support provided.  Daughter states patient is now 50 pounds due to her lack of appetite and nutrition.  She is tearful sharing her mother has been talking with her deceased family members such as her mother and siblings.  Family is aware patient is approaching end-of-life and does not wish to prolong the inevitable.  A detailed discussion was had today regarding advanced directives.  Concepts specific to code status, artifical feeding and hydration, continued IV antibiotics and rehospitalization.    The difference between a aggressive medical intervention and a palliative comfort care path were discussed at length. Values and goals of care important to patient and family were attempted to be elicited.   Family is clear and expressed goals for no aggressive medical interventions, no escalation in care, they are not interested in further work-up regarding suspicious lung cancer.  They would like to transition all care to focus on patient's comfort and end-of-life allow her to spend what time she has left with family and friends.  Daughter verbalizes her understanding of what comfort care will look like while hospitalized in addition to ongoing support outpatient.   Hospice services outpatient were explained and offered.  Emma Basta verbalized understanding and awareness of both palliative and hospice's goals and philosophy of care.  She is requesting hospice home referral at [facility] location.  She shares previous experience with her family members and feels this is the best location for her mother spend her last  moments.  Education provided on hospice referral process.  Emma Basta is clear and expressed goals for all care to transition to comfort focusing on patient's end-of-life.  She confirms DNR/DNI.  Questions and concerns were addressed.  The family was encouraged to call with questions or concerns.  PMT will continue to support holistically as needed.   CODE STATUS: DNR  ADVANCE DIRECTIVES: Primary Decision Maker: Emma Kennedy (daughter/HC POA)   SYMPTOM MANAGEMENT: See below  Palliative Prophylaxis:   Aspiration, Bowel Regimen, Delirium Protocol, Eye Care, Frequent Pain Assessment, Oral Care, Palliative Wound Care and Turn Reposition  PSYCHO-SOCIAL/SPIRITUAL:  Support System: Family  Desire for further Chaplaincy support: No  Additional Recommendations (Limitations, Scope, Preferences):  Full Comfort Care  Education on hospice/palliative    PAST MEDICAL HISTORY: Past Medical History:  Diagnosis Date  . Anemia 2005  . Arthritis   . Diverticulosis   . Headache 2013  . Osteoarthritis   . Osteoporosis   . Thyroid disease     ALLERGIES:  is allergic to hydrocodone-acetaminophen, clinoril [sulindac], diclofenac, meloxicam, methotrexate derivatives, prednisone, relafen [nabumetone], and trospium.   MEDICATIONS:  Current Facility-Administered Medications  Medication Dose Route Frequency Provider Last Rate Last Admin  . 0.9 %  sodium chloride infusion   Intravenous Continuous Eugenie Filler, MD 75 mL/hr at 08/30/20 1033 New Bag at 08/30/20 1033  . acetaminophen (TYLENOL) tablet 650 mg  650 mg Oral Q6H PRN Howerter, Justin B, DO   650 mg at 08/29/20 1962   Or  . acetaminophen (TYLENOL) suppository 650 mg  650 mg Rectal Q6H PRN Howerter, Justin B, DO      . bisacodyl (DULCOLAX) suppository 10 mg  10 mg Rectal Daily Eugenie Filler, MD   10 mg at 08/29/20 1143  . cycloSPORINE (RESTASIS) 0.05 % ophthalmic emulsion 1 drop  1 drop Both Eyes BID Howerter, Justin B, DO   1 drop  at 08/29/20 2220  . famotidine (PEPCID) 40 MG/5ML suspension 20 mg  20 mg Oral Daily Eugenie Filler, MD   20 mg at 08/30/20 1046  . feeding supplement (ENSURE ENLIVE / ENSURE PLUS) liquid 237 mL  237 mL Oral BID BM Howerter, Justin B, DO      . hydroxychloroquine (PLAQUENIL) tablet 200 mg  200 mg Oral QODAY Howerter, Justin B, DO   200 mg at 08/29/20 1140  . levothyroxine (SYNTHROID) tablet 112 mcg  112 mcg Oral QAC breakfast Howerter, Justin B, DO   112 mcg at 08/30/20 0618  . lidocaine (LIDODERM) 5 % 1 patch  1 patch Transdermal Q24H Howerter, Justin B, DO   1 patch at 08/29/20 1147  . LORazepam (ATIVAN) injection 0.25 mg  0.25 mg Intravenous BID PRN Eugenie Filler, MD      . melatonin tablet 10 mg  10 mg Oral Nightly Howerter, Justin B, DO   10 mg at 08/29/20 2219  . polyethylene glycol (MIRALAX / GLYCOLAX) packet 17 g  17 g Oral Daily Howerter, Justin B, DO   17 g at 08/29/20 1141  . polyvinyl alcohol (LIQUIFILM TEARS) 1.4 % ophthalmic solution 1 drop  1 drop Both Eyes PRN Howerter, Justin B, DO      . QUEtiapine (SEROQUEL) tablet  12.5 mg  12.5 mg Oral BID Eugenie Filler, MD   12.5 mg at 08/30/20 1044  . senna-docusate (Senokot-S) tablet 1 tablet  1 tablet Oral QHS PRN Howerter, Justin B, DO      . senna-docusate (Senokot-S) tablet 1 tablet  1 tablet Oral BID Eugenie Filler, MD   1 tablet at 08/30/20 1044  . sulfaSALAzine (AZULFIDINE) tablet 500 mg  500 mg Oral BID Howerter, Justin B, DO   500 mg at 08/29/20 2220    VITAL SIGNS: BP (!) 128/99 (BP Location: Right Arm)   Pulse 82   Temp 98 F (36.7 C) (Oral)   Resp 14   Ht 5\' 3"  (1.6 m)   Wt 38 kg   SpO2 97%   BMI 14.84 kg/m  Filed Weights   08/02/2020 0907 08/29/20 0500 08/30/20 0700  Weight: 38 kg 39.1 kg 38 kg    Estimated body mass index is 14.84 kg/m as calculated from the following:   Height as of this encounter: 5\' 3"  (1.6 m).   Weight as of this encounter: 38 kg.  LABS: CBC:    Component Value Date/Time    WBC 8.0 08/30/2020 0416   HGB 11.2 (L) 08/30/2020 0416   HCT 33.7 (L) 08/30/2020 0416   PLT 308 08/30/2020 0416   Comprehensive Metabolic Panel:    Component Value Date/Time   NA 132 (L) 08/30/2020 0416   K 3.6 08/30/2020 0416   BUN 17 08/30/2020 0416   CREATININE 0.49 08/30/2020 0416   ALBUMIN 3.5 08/29/2020 0517     Review of Systems  Unable to perform ROS: Dementia   Physical Exam General: NAD, somnolent, chronically-ill appearing Cardiovascular: regular rate and rhythm Pulmonary:  diminished bilaterally  Abdomen: soft, nontender, + bowel sounds Extremities: no edema, no joint deformities Skin: no rashes, warm and dry Neurological: Somnolent, will open eyes, unable to assess mentation   Prognosis: [place] the setting of advanced dementia, hypothyroidism, interstitial lung disease, weight loss greater than 10%, protein calorie malnutrition, poor p.o. intake, generalized weakness, hyponatremia, left upper lobe lung mass (highly suspicious for malignancy family is declining any further work-up), deconditioning, and lethargy.  Discharge Planning:  Hospice facility  Recommendations: . DNR/DNI-as confirmed by daughter/HC POA . Additional care to focus on comfort.  We will discontinue all medications/medical interventions not focused on symptom management. . Detailed discussion with Emma Basta (daughter/HC POA) regarding patient's current illness, prognosis, and quality of life.  Family is in mutual agreement patient's condition has drastically declined over the past month with no meaningful recovery.  They expressed clear goals to transition all care to focus on her comfort/end-of-life. Fentanyl PRN for pain/air hunger/comfort Robinul PRN for excessive secretions Ativan PRN for agitation/anxiety Zofran PRN for nausea Liquifilm tears PRN for dry eyes May have comfort feeding Comfort cart for family Unrestricted visitations in the setting of EOL (per policy) Oxygen PRN 2L or  less for comfort. No escalation.  . Family requested hospice home referral (TOC and Kieth Brightly, RN- [facility] hospice liaison aware) . PMT will continue to support and follow as needed. Please call team line with urgent needs.   Palliative Performance Scale: PPS 10-20%               Family expressed understanding and was in agreement with this plan.   Thank you for allowing the Palliative Medicine Team to assist in the care of this patient. Please utilize secure chat with additional questions, if there is no response within 30 minutes please  call the above phone number.   Time In: 1240 Time Out: 1345 Time Total: 65 min.   Visit consisted of counseling and education dealing with the complex and emotionally intense issues of symptom management and palliative care in the setting of serious and potentially life-threatening illness.Greater than 50%  of this time was spent counseling and coordinating care related to the above assessment and plan.  Signed by:  Alda Lea, AGPCNP-BC Palliative Medicine Team  Phone: [phone] Pager: [phone] Amion: [place] Team providers are available by phone from 7am to 7pm daily and can be reached through the team cell phone.  Should this patient require assistance outside of these hours, please call the patient's attending physician.

## 2020-08-30 NOTE — Progress Notes (Signed)
SLP Cancellation Note  Patient Details Name: Emma Kennedy MRN: [id number] DOB: 30-Nov-1929   Cancelled treatment:        Orders for BSE were cancelled as pt has transitioned to comfort care w/ Hospice services. ST services will be available if any new needs arise. NSG agreed.      Orinda Kenner, MS, [place] Speech Language Pathologist Rehab Services [phone] [facility] 08/30/2020, 2:34 PM

## 2020-08-31 DIAGNOSIS — R4182 Altered mental status, unspecified: Secondary | ICD-10-CM

## 2020-08-31 LAB — CALCIUM, IONIZED: Calcium, Ionized, Serum: 5.2 mg/dL (ref 4.5–5.6)

## 2020-08-31 MED ORDER — LORAZEPAM 0.5 MG PO TABS
0.5000 mg | ORAL_TABLET | Freq: Every day | ORAL | Status: DC
  Administered 2020-08-31 – 2020-09-01 (×2): 0.5 mg via ORAL
  Filled 2020-08-31 (×2): qty 1

## 2020-08-31 MED ORDER — MORPHINE SULFATE (CONCENTRATE) 10 MG/0.5ML PO SOLN
5.0000 mg | ORAL | Status: DC | PRN
  Administered 2020-09-01 – 2020-09-02 (×3): 5 mg via ORAL
  Filled 2020-08-31 (×3): qty 0.5

## 2020-08-31 MED ORDER — LORAZEPAM 2 MG/ML IJ SOLN
0.5000 mg | INTRAMUSCULAR | Status: DC | PRN
  Administered 2020-09-01: 0.5 mg via SUBLINGUAL
  Filled 2020-08-31 (×2): qty 1

## 2020-08-31 MED ORDER — FAMOTIDINE 20 MG PO TABS
20.0000 mg | ORAL_TABLET | Freq: Every day | ORAL | Status: DC
  Administered 2020-09-01: 20 mg via ORAL
  Filled 2020-08-31: qty 1

## 2020-08-31 MED ORDER — HALOPERIDOL LACTATE 5 MG/ML IJ SOLN
1.0000 mg | Freq: Four times a day (QID) | INTRAMUSCULAR | Status: DC | PRN
  Administered 2020-08-31 – 2020-09-01 (×2): 1 mg via INTRAMUSCULAR
  Filled 2020-08-31 (×2): qty 1

## 2020-08-31 MED ORDER — ONDANSETRON 4 MG PO TBDP: 4.0000 mg | ORAL_TABLET | Freq: Three times a day (TID) | ORAL | Status: DC | PRN

## 2020-08-31 MED ORDER — SENNOSIDES-DOCUSATE SODIUM 8.6-50 MG PO TABS
1.0000 | ORAL_TABLET | Freq: Two times a day (BID) | ORAL | Status: DC
  Administered 2020-08-31 – 2020-09-01 (×2): 1 via ORAL
  Filled 2020-08-31 (×3): qty 1

## 2020-08-31 MED ORDER — LIDOCAINE 5 % EX PTCH
1.0000 | MEDICATED_PATCH | Freq: Every day | CUTANEOUS | Status: DC | PRN
  Filled 2020-08-31: qty 1

## 2020-08-31 NOTE — Care Management Important Message (Signed)
Important Message  Patient Details  Name: Emma Kennedy MRN: [id number] Date of Birth: 06-Oct-1929   Medicare Important Message Given:  Other (see comment)  On comfort care measures with plan to discharge to [place] once bed available.  Medicare IM withheld at this time out of respect for patient and family.   Dannette Barbara 08/31/2020, 8:25 AM

## 2020-08-31 NOTE — TOC Progression Note (Signed)
Transition of Care [facility]) - Progression Note    Patient Details  Name: Emma Kennedy MRN: [id number] Date of Birth: Aug 19, 1929  Transition of Care [facility]) CM/SW [place], [place] Phone Number: 08/31/2020, 10:32 AM  Clinical Narrative:     Damaris Schooner with [name] at [facility] at [phone] to update regarding patient's discharge plan to hospice home. [name] expressed understanding with no questions or concerns at this time, will update staff at [facility].   Patient continues to await hospice bed at this time.   Expected Discharge Plan: Assisted Living Barriers to Discharge: Continued Medical Work up  Expected Discharge Plan and Services Expected Discharge Plan: Assisted Living       Living arrangements for the past 2 months: Assisted Living Facility                                       Social Determinants of Health (SDOH) Interventions    Readmission Risk Interventions Readmission Risk Prevention Plan 08/29/2020  Transportation Screening Complete  PCP or Specialist Appt within 3-5 Days Complete  HRI or [place] Complete  Social Work Consult for [place] Planning/Counseling Complete  Palliative Care Screening Not Applicable  Medication Review Press photographer) Complete  Some recent data might be hidden

## 2020-08-31 NOTE — Progress Notes (Signed)
[facility]) Hospital Liaison RN note:  Unfortunately, Hospice Home is not able to offer a room today. Hospital care team is aware. Spoke with daughter, [name] over the phone to provide update. [place] Liaison will continue to follow for room availability.  Please call with any hospice related questions or concerns.  Thank you for the opportunity to participate in this patient's care.  Zandra Abts, RN [facility] Liaison [phone]

## 2020-08-31 NOTE — Progress Notes (Signed)
Daily Progress Note   Patient Name: Emma Kennedy       Date: 08/31/2020 DOB: 1929-11-07  Age: 87 y.o. MRN#: [id number] Attending Physician: Eugenie Filler, MD Primary Care Physician: Adin Hector, MD Admit Date: 08-29-20  Reason for Consultation/Follow-up: Establishing goals of care  Subjective: Chart Reviewed. Updates Received. Patient Assessed.   Patient is awake and alert this morning. Remains confused. Denies pain. No acute distress noted. Took a few sips of water however, coughing noted.   Daughters, [name] and [name] are at the bedside. We discussed at length updates and expectations in the setting of comfort care. Daughters verbalized understanding and appreciation.   We discussed risk of aspiration if patient was alert and able to take in sips.   Daughters are tearful expressing and sharing their memories of their mom and watching her health rapidly decline over the past month. They share this morning she was talking and smiling as if to have a conversation with their deceased brother and father. Patient was able to speak with their Doristine Bosworth on today also and daughters share she kept saying that she was going to heaven just waiting for her next signal from God. Emotional support provided.   We discussed continued symptom management. Patient is doing well with Seroquel with some agitation in the early afternoons. Her IV site was discontinued yesterday and mittens removed to encouraged decrease agitation. Education provided on changing medications to oral or SL route. Daughters would like patient to receive medications throughout the day as available to minimize her agitation with awareness she may have increased drowsiness with ativan. They verbalized understanding expressing they are ok if she is resting as they do not like to see her in that state.   All questions answered and support provided.   Length of Stay: 3 days  Vital Signs: BP (!) 165/75 (BP Location: Left Arm)    Pulse 67   Temp (!) 97.5 F (36.4 C)   Resp 18   Ht 5\' 3"  (1.6 m)   Wt 38 kg   SpO2 99%   BMI 14.84 kg/m  SpO2: SpO2: 99 % O2 Device: O2 Device: Room Air O2 Flow Rate:    Physical Exam: Awake, alert, confused RRR Diminished Will not follow commonds             Palliative Care Assessment & Plan  HPI:  Palliative Care consult requested for goals of care discussion in this 87 y.o. female with a medical history significant for menstrual, rheumatoid arthritis, interstitial lung disease, osteoporosis, and hypothyroidism.  Patient presented to the ED from [facility] facility with concerns of altered mental status.  Recent CT chest showed new 3.6 x 5.6 left upper lobe mass suspicious for malignancy.  Family has declined work-up.  Code Status:  DNR  Goals of Care/Recommendations:  Continue comfort care  Pending transfer to hospice home once bed is available  Appetite remains poor, with occasional sips.   Ativan scheduled to assist in decrease agitation. PRN also available.  PMT will continue to support and follow as needed.   Discharge Planning: Hospice facility  Thank you for allowing the Palliative Medicine Team to assist in the care of this patient.  Time Total: 50 min.   Visit consisted of counseling and education dealing with the complex and emotionally intense issues of symptom management and palliative care in the setting of serious and potentially life-threatening illness.Greater than 50%  of this time was spent counseling and coordinating care related  to the above assessment and plan.  Alda Lea, AGPCNP-BC  Palliative Medicine Team [phone]

## 2020-08-31 NOTE — Progress Notes (Signed)
PROGRESS NOTE    Emma Kennedy  ZOX:[id number] DOB: 1929-08-17 DOA: 08/02/2020 PCP: Adin Hector, MD   Chief Complaint  Patient presents with  . Fall  . Altered Mental Status    Brief Narrative:  Patient is a 87 year old female history of dementia, hypothyroidism, RA, chronic interstitial lung disease presented to the ED with acute metabolic encephalopathy after presenting from independent living facility for evaluation of altered mental status. CT head done negative.  CT chest abdomen and pelvis done concerning for lung mass.  Infectious work-up negative to date. Palliative care met with family and decision was made to transition to full comfort measures.   Assessment & Plan:   Principal Problem:   Acute metabolic encephalopathy Active Problems:   RA (rheumatoid arthritis) (HCC)   Hypothyroidism   Generalized weakness   Acute hyponatremia   Dehydration   Prolonged QT interval   Closed wedge compression fracture of third thoracic vertebra (HCC)   Lung mass   Constipation  1 acute metabolic encephalopathy -Patient presented with worsening somnolence/lethargy which per admitting physician abruptly started 2 weeks prior to admission versus progressive decline. -Patient with history of underlying advanced dementia. -Patient noted to be in an independent living facility prior to onset of symptoms. -CT head done negative for any acute abnormalities. -Infectious work-up negative to date. -Urinalysis clear, nitrite negative, leukocytes negative, 0-5 WBC. -Chest x-ray done negative for any acute abnormalities. -CT chest/abdomen/pelvis done with new 3.6 x 5.6 cm consolidation/mass within the anterior mid left upper lobe suspicious for malignancy, infection less likely, trace left pleural effusion, severe T3 compression fracture age-indeterminate, no evidence of acute injury in the abdomen or pelvis, large amount of stool within the distal colon and rectum, chronic interstitial  lung disease. -TSH within normal limits. -Patient also noted to be dehydrated on examination. -Patient hydrated with IV fluids. -Continue current bowel regimen due to constipation noted on CT abdomen and pelvis. -Try to minimize sedating medications. -Patient Cymbalta, mirtazapine, gabapentin on hold. -Palliative care have met with family and decision has been made to transition to full comfort measures. -Awaiting transfer to residential hospice home.  2.  Generalized weakness -Per family patient with generalized weakness associated with worsening somnolence/lethargy. -Concerned that patient may need a higher level of care. -Infectious work-up negative to date. -CT head negative. -PT/OT. -Palliative care met with family and decision made to transition to full comfort measures.   -Awaiting placement at residential hospice home.   3.  Hyponatremia -Likely secondary to hypovolemic hyponatremia secondary to poor oral intake. -Improved with hydration. -IV fluids saline lock.  4.  LUL lung mass -3.6 x 5.6 cm mass noted in LUL on CT chest suspicious for malignancy. -Admitting physician spoke with patient's daughter, POA, who conveyed that patient would not want any histopathological identification of the mass nor want any further evaluation nor want any life prolonging measures or interventions if it relates to suspected malignancy. -Palliative care met with family and decision made to transition to full comfort measures.   -Awaiting placement at residential hospice home.   5.  Prolonged QTc  Interval -Repeat EKG with resolution of QTC prolongation.  6.  Advanced dementia with behavioral disturbance -Patient noted to have significant agitation on 08/29/2020 had to be given IV Ativan for sedation.   -Patient started on Seroquel 12.5 mg twice daily with improvement with agitation.   -Scheduled Ativan placed by palliative care at 4 PM to help with agitation.   -IV Ativan as needed   -  Palliative care met with family and decision has been made to transition to full comfort measures.  7.  Hypothyroidism -Patient transitioned to full comfort measures.  8.  Rheumatoid arthritis -Patient has been transitioned to full comfort measures.   -Sulfasalazine and Plaquenil have been discontinued.   9.  GERD -Pepcid.  10.  Constipation -Noted on CT abdomen and pelvis -Continue current bowel regimen of Dulcolax suppository, Senokot-S twice daily, MiraLAX daily.  11.  T3 compression fracture -Noted on CT.  Age-indeterminate. -PT/OT. -Pain management. -Patient currently transition to full comfort measures.     DVT prophylaxis: SCDs Code Status: DNR Family Communication: Updated daughters at bedside Disposition:   Status is: Inpatient    Dispo: The patient is from: Independent living/ALF              Anticipated d/c is to: Residential hospice home              Patient currently has been transitioned to comfort measures.  Medically stable.  Awaiting transfer to residential hospice home.     Difficult to place patient no       Consultants:   Palliative care: Jobe Gibbon, NP 08/30/2020  Procedures:   CT head/CT C-spine 08/09/2020  CT chest abdomen and pelvis 08/07/2020  Chest x-ray 08/01/2020  Antimicrobials:   None   Subjective: Patient laying in bed.  Alert.  Denies any chest pain.  No shortness of breath.  No abdominal pain.  Pleasantly confused.  Daughters at bedside.   Objective: Vitals:   08/30/20 1212 08/30/20 1533 08/31/20 0611 08/31/20 0930  BP: (!) 128/99 (!) 141/73 (!) 154/77 (!) 165/75  Pulse: 82 75 70 67  Resp: '14 16 12 18  ' Temp: 98 F (36.7 C) 98.4 F (36.9 C) 98.2 F (36.8 C) (!) 97.5 F (36.4 C)  TempSrc: Oral Oral Oral   SpO2: 97% 100% 96% 99%  Weight:      Height:        Intake/Output Summary (Last 24 hours) at 08/31/2020 1054 Last data filed at 08/30/2020 1700 Gross per 24 hour  Intake 346.59 ml  Output --   Net 346.59 ml   Filed Weights   08/02/2020 0907 08/29/20 0500 08/30/20 0700  Weight: 38 kg 39.1 kg 38 kg    Examination:  General exam: : NAD Respiratory system: CTA B anterior lung fields.  No wheezes, no rhonchi.  Speaking in full sentences.  Normal respiratory effort. Cardiovascular system: Regular rate and rhythm no murmurs rubs or gallops.  No JVD.  No lower extremity edema.  Gastrointestinal system: Abdomen soft, nontender, nondistended, positive bowel sounds.  No rebound.  No guarding. Central nervous system: Alert. No focal neurological deficits.  Moving extremities spontaneously with Extremities: Symmetric 5 x 5 power. Skin: No rashes, lesions or ulcers Psychiatry: Judgement and insight appear poor. Mood & affect appropriate.  Data Reviewed: I have personally reviewed following labs and imaging studies  CBC: Recent Labs  Lab 08/25/2020 0917 08/29/20 0517 08/30/20 0416  WBC 8.8 10.1 8.0  NEUTROABS  --   --  6.2  HGB 12.4 11.8* 11.2*  HCT 37.9 35.4* 33.7*  MCV 94.3 92.4 92.3  PLT 371 332 979    Basic Metabolic Panel: Recent Labs  Lab 08/17/2020 0917 08/29/20 0517 08/30/20 0416  NA 132* 137 132*  K 4.3 4.1 3.6  CL 97* 99 98  CO2 '26 28 25  ' GLUCOSE 135* 86 90  BUN 19 27* 17  CREATININE 0.74 0.62 0.49  CALCIUM  9.6 9.5 9.0  MG 2.2 1.9 1.7    GFR: Estimated Creatinine Clearance: 28.6 mL/min (by C-G formula based on SCr of 0.49 mg/dL).  Liver Function Tests: Recent Labs  Lab 07/30/2020 0917 08/29/20 0517  AST 26 25  ALT 13 13  ALKPHOS 84 71  BILITOT 0.6 0.6  PROT 6.4* 6.5  ALBUMIN 3.4* 3.5    CBG: No results for input(s): GLUCAP in the last 168 hours.   Recent Results (from the past 240 hour(s))  Resp Panel by RT-PCR (Flu A&B, Covid) Nasopharyngeal Swab     Status: None   Collection Time: 08/15/2020  9:17 AM   Specimen: Nasopharyngeal Swab; Nasopharyngeal(NP) swabs in vial transport medium  Result Value Ref Range Status   SARS Coronavirus 2 by RT  PCR NEGATIVE NEGATIVE Final    Comment: (NOTE) SARS-CoV-2 target nucleic acids are NOT DETECTED.  The SARS-CoV-2 RNA is generally detectable in upper respiratory specimens during the acute phase of infection. The lowest concentration of SARS-CoV-2 viral copies this assay can detect is 138 copies/mL. A negative result does not preclude SARS-Cov-2 infection and should not be used as the sole basis for treatment or other patient management decisions. A negative result may occur with  improper specimen collection/handling, submission of specimen other than nasopharyngeal swab, presence of viral mutation(s) within the areas targeted by this assay, and inadequate number of viral copies(<138 copies/mL). A negative result must be combined with clinical observations, patient history, and epidemiological information. The expected result is Negative.  Fact Sheet for Patients:  EntrepreneurPulse.com.au  Fact Sheet for Healthcare Providers:  IncredibleEmployment.be  This test is no t yet approved or cleared by the [place] FDA and  has been authorized for detection and/or diagnosis of SARS-CoV-2 by FDA under an Emergency Use Authorization (EUA). This EUA will remain  in effect (meaning this test can be used) for the duration of the COVID-19 declaration under Section 564(b)(1) of the Act, 21 U.S.C.section 360bbb-3(b)(1), unless the authorization is terminated  or revoked sooner.       Influenza A by PCR NEGATIVE NEGATIVE Final   Influenza B by PCR NEGATIVE NEGATIVE Final    Comment: (NOTE) The Xpert Xpress SARS-CoV-2/FLU/RSV plus assay is intended as an aid in the diagnosis of influenza from Nasopharyngeal swab specimens and should not be used as a sole basis for treatment. Nasal washings and aspirates are unacceptable for Xpert Xpress SARS-CoV-2/FLU/RSV testing.  Fact Sheet for Patients: EntrepreneurPulse.com.au  Fact Sheet for  Healthcare Providers: IncredibleEmployment.be  This test is not yet approved or cleared by the [place] FDA and has been authorized for detection and/or diagnosis of SARS-CoV-2 by FDA under an Emergency Use Authorization (EUA). This EUA will remain in effect (meaning this test can be used) for the duration of the COVID-19 declaration under Section 564(b)(1) of the Act, 21 U.S.C. section 360bbb-3(b)(1), unless the authorization is terminated or revoked.  Performed at [facility], [address] [place], [place] [address]   Blood culture (routine single)     Status: None (Preliminary result)   Collection Time: 08/11/2020  9:17 AM   Specimen: BLOOD  Result Value Ref Range Status   Specimen Description BLOOD LEFT ANTECUBITAL  Final   Special Requests   Final    BOTTLES DRAWN AEROBIC AND ANAEROBIC Blood Culture results may not be optimal due to an inadequate volume of blood received in culture bottles   Culture   Final    NO GROWTH 3 DAYS Performed at [facility], [address], [place], [place] [address]    Report Status PENDING  Incomplete  Urine culture     Status: None   Collection Time: 08/06/2020  9:28 AM   Specimen: Urine, Random  Result Value Ref Range Status   Specimen Description   Final    URINE, RANDOM Performed at [facility], [address] [place], [place] [address]    Special Requests   Final    NONE Performed at [facility], [address] [place], [place] [address]    Culture   Final    NO GROWTH Performed at [facility], [place] [address] [place], [place] [address]    Report Status 08/29/2020 FINAL  Final  MRSA PCR Screening     Status: None   Collection Time: 08/29/20  8:11 PM   Specimen: Nasal Mucosa; Nasopharyngeal  Result Value Ref Range Status   MRSA by PCR NEGATIVE NEGATIVE Final    Comment:        The GeneXpert MRSA Assay (FDA approved for NASAL specimens only), is one  component of a comprehensive MRSA colonization surveillance program. It is not intended to diagnose MRSA infection nor to guide or monitor treatment for MRSA infections. Performed at [facility], [address], [place], [place] [address]          Radiology Studies: No results found.      Scheduled Meds: . cycloSPORINE  1 drop Both Eyes BID  . lidocaine  1 patch Transdermal Q24H  . LORazepam  0.5 mg Oral q1600  . QUEtiapine  12.5 mg Oral BID   Continuous Infusions:    LOS: 3 days    Time spent: 35 minutes    Irine Seal, MD [facility]   To contact the attending provider between 7A-7P or the covering provider during after hours 7P-7A, please log into the web site [email or web address] and access using universal [place] password for that web site. If you do not have the password, please call the hospital operator.  08/31/2020, 10:54 AM

## 2020-09-01 ENCOUNTER — Other Ambulatory Visit: Payer: Self-pay

## 2020-09-01 MED ORDER — LORAZEPAM 2 MG/ML IJ SOLN
1.0000 mg | INTRAMUSCULAR | Status: DC | PRN
  Administered 2020-09-02 (×3): 1 mg via INTRAMUSCULAR
  Filled 2020-09-01: qty 1

## 2020-09-01 MED ORDER — LORAZEPAM 2 MG/ML IJ SOLN
1.0000 mg | INTRAMUSCULAR | Status: DC | PRN
  Filled 2020-09-01: qty 1

## 2020-09-01 NOTE — Progress Notes (Signed)
Patient ID: Lenon Curt, female   DOB: [birth year], 87 y.o.    PROGRESS NOTE    SHADONNA BENEDICK  MBE:[id number] DOB: [birth year] DOA: 08/05/2020 PCP: Adin Hector, MD   Brief Narrative: 87 year old female history of dementia, hypothyroidism, RA, chronic interstitial lung disease presented to the ED with acute metabolic encephalopathy after presenting from independent living facility for evaluation of altered mental status. Palliative care met with family and decision was made to transition to full comfort measures.   Assessment & Plan:   Comfort measures only status Acute metabolic encephalopathy New left upper lobe consolidation/mass Generalized weakness Advanced dementia with behavioral disturbance Hypothyroidism Chronic interstitial lung disease Hyponatremia Rheumatoid arthritis GERD Constipation T3 compression fracture  Plan -Continue comfort measures.  Awaiting placement at residential hospice.  TOC following.  DVT prophylaxis: None per comfort measures Code Status: DNR Family Communication:  Disposition Plan: Status is: Inpatient  Remains inpatient appropriate because:Inpatient level of care appropriate due to severity of illness   Dispo: The patient is from: ALF              Anticipated d/c is to: Residential hospice              Patient currently is medically stable to d/c.   Difficult to place patient Yes  Consultants: Palliative care  Procedures: None  Antimicrobials: None   Subjective: Patient seen and examined at bedside.  She looks unresponsive but looks comfortable.  Objective: Vitals:   08/30/20 1533 08/31/20 0611 08/31/20 0930 08/31/20 2044  BP: (!) 141/73 (!) 154/77 (!) 165/75 (!) 174/86  Pulse: 75 70 67 82  Resp: '16 12 18 16  ' Temp: 98.4 F (36.9 C) 98.2 F (36.8 C) (!) 97.5 F (36.4 C) 98.3 F (36.8 C)  TempSrc: Oral Oral  Oral  SpO2: 100% 96% 99% 92%  Weight:      Height:        Intake/Output Summary (Last 24 hours) at 09/01/2020  1120 Last data filed at 09/01/2020 0071 Gross per 24 hour  Intake 0 ml  Output --  Net 0 ml   Filed Weights   08/09/2020 0907 08/29/20 0500 08/30/20 0700  Weight: 38 kg 39.1 kg 38 kg    Examination:  General exam: Elderly female lying in bed.  No acute distress.  Unresponsive but comfortable.   Data Reviewed: I have personally reviewed following labs and imaging studies  CBC: Recent Labs  Lab 08/22/2020 0917 08/29/20 0517 08/30/20 0416  WBC 8.8 10.1 8.0  NEUTROABS  --   --  6.2  HGB 12.4 11.8* 11.2*  HCT 37.9 35.4* 33.7*  MCV 94.3 92.4 92.3  PLT 371 332 219   Basic Metabolic Panel: Recent Labs  Lab 08/25/2020 0917 08/29/20 0517 08/30/20 0416  NA 132* 137 132*  K 4.3 4.1 3.6  CL 97* 99 98  CO2 '26 28 25  ' GLUCOSE 135* 86 90  BUN 19 27* 17  CREATININE 0.74 0.62 0.49  CALCIUM 9.6 9.5 9.0  MG 2.2 1.9 1.7   GFR: Estimated Creatinine Clearance: 28.6 mL/min (by C-G formula based on SCr of 0.49 mg/dL). Liver Function Tests: Recent Labs  Lab 08/02/2020 0917 08/29/20 0517  AST 26 25  ALT 13 13  ALKPHOS 84 71  BILITOT 0.6 0.6  PROT 6.4* 6.5  ALBUMIN 3.4* 3.5   No results for input(s): LIPASE, AMYLASE in the last 168 hours. No results for input(s): AMMONIA in the last 168 hours. Coagulation Profile: Recent Labs  Lab 08/20/2020 0917  INR 0.9   Cardiac Enzymes: Recent Labs  Lab 08/17/2020 2035  CKTOTAL 102   BNP (last 3 results) No results for input(s): PROBNP in the last 8760 hours. HbA1C: No results for input(s): HGBA1C in the last 72 hours. CBG: No results for input(s): GLUCAP in the last 168 hours. Lipid Profile: No results for input(s): CHOL, HDL, LDLCALC, TRIG, CHOLHDL, LDLDIRECT in the last 72 hours. Thyroid Function Tests: No results for input(s): TSH, T4TOTAL, FREET4, T3FREE, THYROIDAB in the last 72 hours. Anemia Panel: No results for input(s): VITAMINB12, FOLATE, FERRITIN, TIBC, IRON, RETICCTPCT in the last 72 hours. Sepsis Labs: Recent Labs  Lab  08/14/2020 1610  LATICACIDVEN 1.8    Recent Results (from the past 240 hour(s))  Resp Panel by RT-PCR (Flu A&B, Covid) Nasopharyngeal Swab     Status: None   Collection Time: 08/15/2020  9:17 AM   Specimen: Nasopharyngeal Swab; Nasopharyngeal(NP) swabs in vial transport medium  Result Value Ref Range Status   SARS Coronavirus 2 by RT PCR NEGATIVE NEGATIVE Final    Comment: (NOTE) SARS-CoV-2 target nucleic acids are NOT DETECTED.  The SARS-CoV-2 RNA is generally detectable in upper respiratory specimens during the acute phase of infection. The lowest concentration of SARS-CoV-2 viral copies this assay can detect is 138 copies/mL. A negative result does not preclude SARS-Cov-2 infection and should not be used as the sole basis for treatment or other patient management decisions. A negative result may occur with  improper specimen collection/handling, submission of specimen other than nasopharyngeal swab, presence of viral mutation(s) within the areas targeted by this assay, and inadequate number of viral copies(<138 copies/mL). A negative result must be combined with clinical observations, patient history, and epidemiological information. The expected result is Negative.  Fact Sheet for Patients:  EntrepreneurPulse.com.au  Fact Sheet for Healthcare Providers:  IncredibleEmployment.be  This test is no t yet approved or cleared by the [place] FDA and  has been authorized for detection and/or diagnosis of SARS-CoV-2 by FDA under an Emergency Use Authorization (EUA). This EUA will remain  in effect (meaning this test can be used) for the duration of the COVID-19 declaration under Section 564(b)(1) of the Act, 21 U.S.C.section 360bbb-3(b)(1), unless the authorization is terminated  or revoked sooner.       Influenza A by PCR NEGATIVE NEGATIVE Final   Influenza B by PCR NEGATIVE NEGATIVE Final    Comment: (NOTE) The Xpert Xpress  SARS-CoV-2/FLU/RSV plus assay is intended as an aid in the diagnosis of influenza from Nasopharyngeal swab specimens and should not be used as a sole basis for treatment. Nasal washings and aspirates are unacceptable for Xpert Xpress SARS-CoV-2/FLU/RSV testing.  Fact Sheet for Patients: EntrepreneurPulse.com.au  Fact Sheet for Healthcare Providers: IncredibleEmployment.be  This test is not yet approved or cleared by the [place] FDA and has been authorized for detection and/or diagnosis of SARS-CoV-2 by FDA under an Emergency Use Authorization (EUA). This EUA will remain in effect (meaning this test can be used) for the duration of the COVID-19 declaration under Section 564(b)(1) of the Act, 21 U.S.C. section 360bbb-3(b)(1), unless the authorization is terminated or revoked.  Performed at [facility], [address] [place], [place] [address]   Blood culture (routine single)     Status: None (Preliminary result)   Collection Time: 08/17/2020  9:17 AM   Specimen: BLOOD  Result Value Ref Range Status   Specimen Description BLOOD LEFT ANTECUBITAL  Final   Special Requests  Final    BOTTLES DRAWN AEROBIC AND ANAEROBIC Blood Culture results may not be optimal due to an inadequate volume of blood received in culture bottles   Culture   Final    NO GROWTH 4 DAYS Performed at [facility], [address] [place], [place] [address]    Report Status PENDING  Incomplete  Urine culture     Status: None   Collection Time: 08/03/2020  9:28 AM   Specimen: Urine, Random  Result Value Ref Range Status   Specimen Description   Final    URINE, RANDOM Performed at [facility], [address], [place], [place] [address]    Special Requests   Final    NONE Performed at [facility], [address], [place], [place] [address]    Culture   Final    NO GROWTH Performed at [facility], [place] [address]  [place], [place] [address]    Report Status 08/29/2020 FINAL  Final  MRSA PCR Screening     Status: None   Collection Time: 08/29/20  8:11 PM   Specimen: Nasal Mucosa; Nasopharyngeal  Result Value Ref Range Status   MRSA by PCR NEGATIVE NEGATIVE Final    Comment:        The GeneXpert MRSA Assay (FDA approved for NASAL specimens only), is one component of a comprehensive MRSA colonization surveillance program. It is not intended to diagnose MRSA infection nor to guide or monitor treatment for MRSA infections. Performed at [facility], [address] [place], [place] [address]          Radiology Studies: No results found.      Scheduled Meds: . cycloSPORINE  1 drop Both Eyes BID  . famotidine  20 mg Oral Daily  . LORazepam  0.5 mg Oral q1600  . QUEtiapine  12.5 mg Oral BID  . senna-docusate  1 tablet Oral BID   Continuous Infusions:        Aline August, MD [facility] 09/01/2020, 11:20 AM

## 2020-09-01 NOTE — TOC Progression Note (Signed)
Transition of Care [facility]) - Progression Note    Patient Details  Name: Emma Kennedy MRN: [id number] Date of Birth: 1929/07/14  Transition of Care [facility]) CM/SW Contact  Beverly Sessions, RN Phone Number: 09/01/2020, 9:14 AM  Clinical Narrative:    Per Kieth Brightly with [facility] no hospice bed available at this time   Expected Discharge Plan: Assisted Living Barriers to Discharge: Continued Medical Work up  Expected Discharge Plan and Services Expected Discharge Plan: Assisted Living       Living arrangements for the past 2 months: [place]                                       Social Determinants of Health (SDOH) Interventions    Readmission Risk Interventions Readmission Risk Prevention Plan 08/29/2020  Transportation Screening Complete  PCP or Specialist Appt within 3-5 Days Complete  HRI or [place] Complete  Social Work Consult for [place] Planning/Counseling Complete  Palliative Care Screening Not Applicable  Medication Review [employer] photographer) Complete  Some recent data might be hidden

## 2020-09-01 NOTE — Progress Notes (Signed)
[facility]) Hospital Liaison RN note:  Visited with patient in room and spoke with daughter, [name] at bedside. Unfortunately, Hospice Home is not able to offer a room today. Hospital care team is aware. [place] Liaison will continue to follow for room availability.  Please call with any hospice related questions or concerns.  Zandra Abts, RN [facility] Liaison  [phone]

## 2020-09-01 NOTE — Progress Notes (Signed)
   Daily Progress Note   Patient Name: Emma Kennedy       Date: 09/01/2020 DOB: Jun 24, 1929  Age: 87 y.o. MRN#: [id number] Attending Physician: Aline August, MD Primary Care Physician: Adin Hector, MD Admit Date: 08/13/2020  Reason for Consultation/Follow-up: Establishing goals of care  Subjective: Chart Reviewed. Updates Received. Patient Assessed.   Patient resting but easily awakened. Denies pain. Appetite remains poor. Family states patient continues to have some agitation throughout the day.   Education on medication and therapeutic touch/music to assist with decreasing her agitation. Family verbalized understanding. Daughter is playing relaxing music.   Family updated and aware hospice currently does not have a bed to offer.   All questions answered and support provided.   Length of Stay: 4 days  Vital Signs: BP (!) 174/86 (BP Location: Left Arm)   Pulse 82   Temp 98.3 F (36.8 C) (Oral)   Resp 16   Ht 5\' 3"  (1.6 m)   Wt 38 kg   SpO2 92%   BMI 14.84 kg/m  SpO2: SpO2: 92 % O2 Device: O2 Device: Room Air O2 Flow Rate:    Physical Exam: Awake, alert, confused RRR Diminished          Palliative Care Assessment & Plan  HPI:  Palliative Care consult requested for goals of care discussion in this 87 y.o. female with a medical history significant for menstrual, rheumatoid arthritis, interstitial lung disease, osteoporosis, and hypothyroidism.  Patient presented to the ED from [facility] facility with concerns of altered mental status.  Recent CT chest showed new 3.6 x 5.6 left upper lobe mass suspicious for malignancy.  Family has declined work-up.  Code Status:  DNR  Goals of Care/Recommendations:  Continue comfort care  Pending transfer to hospice home once bed is available  Appetite remains poor, with occasional sips.   Education provided on agitation and symptom management. Ativan scheduled to assist in decrease agitation. Will adjust as needed.  PRN also available.  PMT will continue to support and follow as needed.   Discharge Planning: Hospice facility  Thank you for allowing the Palliative Medicine Team to assist in the care of this patient.  Time Total: 20 min.   Visit consisted of counseling and education dealing with the complex and emotionally intense issues of symptom management and palliative care in the setting of serious and potentially life-threatening illness.Greater than 50%  of this time was spent counseling and coordinating care related to the above assessment and plan.  Alda Lea, AGPCNP-BC  Palliative Medicine Team [phone]

## 2020-09-02 LAB — CULTURE, BLOOD (SINGLE): Culture: NO GROWTH

## 2020-09-02 MED ORDER — LORAZEPAM 2 MG/ML IJ SOLN
1.0000 mg | Freq: Four times a day (QID) | INTRAMUSCULAR | Status: DC
  Administered 2020-09-02 (×2): 1 mg via INTRAVENOUS
  Filled 2020-09-02 (×3): qty 1

## 2020-09-02 MED ORDER — GLYCOPYRROLATE 0.2 MG/ML IJ SOLN
0.3000 mg | INTRAMUSCULAR | Status: DC | PRN
  Filled 2020-09-02: qty 1.5

## 2020-09-02 MED ORDER — SCOPOLAMINE 1 MG/3DAYS TD PT72
1.0000 | MEDICATED_PATCH | TRANSDERMAL | Status: DC
  Administered 2020-09-02: 1.5 mg via TRANSDERMAL
  Filled 2020-09-02: qty 1

## 2020-09-02 MED ORDER — MORPHINE SULFATE (PF) 2 MG/ML IV SOLN
1.0000 mg | INTRAVENOUS | Status: DC | PRN
  Administered 2020-09-02: 2 mg via INTRAVENOUS
  Filled 2020-09-02: qty 1

## 2020-09-02 MED ORDER — LORAZEPAM 2 MG/ML IJ SOLN: 1.0000 mg | INTRAMUSCULAR | Status: DC | PRN

## 2020-09-02 MED ORDER — GLYCOPYRROLATE 0.2 MG/ML IJ SOLN
0.2000 mg | INTRAMUSCULAR | Status: DC | PRN
  Administered 2020-09-02 (×2): 0.2 mg via INTRAMUSCULAR
  Filled 2020-09-02 (×5): qty 1

## 2020-09-02 MED ORDER — LORAZEPAM 2 MG/ML IJ SOLN
2.0000 mg | INTRAMUSCULAR | Status: DC | PRN
Start: 2020-09-02 — End: 2020-09-03

## 2020-09-02 MED ORDER — MORPHINE SULFATE (CONCENTRATE) 10 MG/0.5ML PO SOLN: 10.0000 mg | ORAL | Status: DC | PRN

## 2020-09-02 NOTE — Progress Notes (Signed)
    Updates Received. Chart Reviewed.   Patient is unresponsive. Audible congestion with increased work of breathing noted. Family is at the bedside.   I discussed at length patient's changes in condition today with anticipation of a hospital death given her changes. Family is aware patient is actively dying and life expectancy may be hours to days.   Education provided on end-of-life expectations and symptom management with a goal of comfort. Family is tearful verbalizing their understanding. They would like to aggressively treat symptoms to ensure Ms. Mauri is not suffering or in any form of distress.   Created space and opportunity for family to express feelings and share memories of patient.   All questions answered and support provided.   Assessment -Increased work of breathing, actively dying -Irregular -tachypneic, rhonchi, audible secretion -unresponsive  Plan -continue care to focus on comfort with aggressive symptom management -Scheduled Morphine for air hunger/pain, education provided that if symptoms are not manageable morphine drip may be needed. Family in agreement -Scheduled Robinul for secretions scopolamine patch  -Scheduled ativan -RN aware to insert IV for medication administration -Spiritual support -PMT will continue to support and follow.   Time Total: 45 min.   Visit consisted of counseling and education dealing with the complex and emotionally intense issues of symptom management and palliative care in the setting of serious and potentially life-threatening illness.Greater than 50%  of this time was spent counseling and coordinating care related to the above assessment and plan.  Alda Lea, AGPCNP-BC  Palliative Medicine Team [phone]

## 2020-09-02 NOTE — Progress Notes (Signed)
Patient ID: Emma Kennedy, female   DOB: Jan 30, 1930, 87 y.o.    PROGRESS NOTE    Emma Kennedy  FSE:[id number] DOB: 04-May-1929 DOA: 07/30/2020 PCP: Adin Hector, MD   Brief Narrative: 87 year old female history of dementia, hypothyroidism, RA, chronic interstitial lung disease presented to the ED with acute metabolic encephalopathy after presenting from independent living facility for evaluation of altered mental status. Palliative care met with family and decision was made to transition to full comfort measures.   Assessment & Plan:   Comfort measures only status Acute metabolic encephalopathy New left upper lobe consolidation/mass Generalized weakness Advanced dementia with behavioral disturbance Hypothyroidism Chronic interstitial lung disease Hyponatremia Rheumatoid arthritis GERD Constipation T3 compression fracture  Plan -Continue comfort measures.  Still awaiting placement at residential hospice.  TOC following.  DVT prophylaxis: None per comfort measures Code Status: DNR Family Communication:  Disposition Plan: Status is: Inpatient  Remains inpatient appropriate because:Inpatient level of care appropriate due to severity of illness   Dispo: The patient is from: ALF              Anticipated d/c is to: Residential hospice              Patient currently is medically stable to d/c.   Difficult to place patient Yes  Consultants: Palliative care  Procedures: None  Antimicrobials: None   Subjective: Patient seen and examined at bedside.  Patient looks comfortable but unresponsive.  Objective: Vitals:   08/31/20 0611 08/31/20 0930 08/31/20 2044 09/01/20 1603  BP: (!) 154/77 (!) 165/75 (!) 174/86 (!) 155/83  Pulse: 70 67 82 80  Resp: '12 18 16 15  ' Temp: 98.2 F (36.8 C) (!) 97.5 F (36.4 C) 98.3 F (36.8 C) 98.3 F (36.8 C)  TempSrc: Oral  Oral Oral  SpO2: 96% 99% 92% 98%  Weight:      Height:       No intake or output data in the 24 hours ending  09/02/20 0759 Filed Weights   08/07/2020 0907 08/29/20 0500 08/30/20 0700  Weight: 38 kg 39.1 kg 38 kg    Examination:  General exam: Elderly female lying in bed.  No distress.  Comfortable but unresponsive.  Data Reviewed: I have personally reviewed following labs and imaging studies  CBC: Recent Labs  Lab 08/18/2020 0917 08/29/20 0517 08/30/20 0416  WBC 8.8 10.1 8.0  NEUTROABS  --   --  6.2  HGB 12.4 11.8* 11.2*  HCT 37.9 35.4* 33.7*  MCV 94.3 92.4 92.3  PLT 371 332 435   Basic Metabolic Panel: Recent Labs  Lab 08/16/2020 0917 08/29/20 0517 08/30/20 0416  NA 132* 137 132*  K 4.3 4.1 3.6  CL 97* 99 98  CO2 '26 28 25  ' GLUCOSE 135* 86 90  BUN 19 27* 17  CREATININE 0.74 0.62 0.49  CALCIUM 9.6 9.5 9.0  MG 2.2 1.9 1.7   GFR: Estimated Creatinine Clearance: 28.6 mL/min (by C-G formula based on SCr of 0.49 mg/dL). Liver Function Tests: Recent Labs  Lab 08/14/2020 0917 08/29/20 0517  AST 26 25  ALT 13 13  ALKPHOS 84 71  BILITOT 0.6 0.6  PROT 6.4* 6.5  ALBUMIN 3.4* 3.5   No results for input(s): LIPASE, AMYLASE in the last 168 hours. No results for input(s): AMMONIA in the last 168 hours. Coagulation Profile: Recent Labs  Lab 08/20/2020 0917  INR 0.9   Cardiac Enzymes: Recent Labs  Lab 08/08/2020 2035  CKTOTAL 102   BNP (  last 3 results) No results for input(s): PROBNP in the last 8760 hours. HbA1C: No results for input(s): HGBA1C in the last 72 hours. CBG: No results for input(s): GLUCAP in the last 168 hours. Lipid Profile: No results for input(s): CHOL, HDL, LDLCALC, TRIG, CHOLHDL, LDLDIRECT in the last 72 hours. Thyroid Function Tests: No results for input(s): TSH, T4TOTAL, FREET4, T3FREE, THYROIDAB in the last 72 hours. Anemia Panel: No results for input(s): VITAMINB12, FOLATE, FERRITIN, TIBC, IRON, RETICCTPCT in the last 72 hours. Sepsis Labs: Recent Labs  Lab 08/10/2020 6712  LATICACIDVEN 1.8    Recent Results (from the past 240 hour(s))  Resp  Panel by RT-PCR (Flu A&B, Covid) Nasopharyngeal Swab     Status: None   Collection Time: 08/01/2020  9:17 AM   Specimen: Nasopharyngeal Swab; Nasopharyngeal(NP) swabs in vial transport medium  Result Value Ref Range Status   SARS Coronavirus 2 by RT PCR NEGATIVE NEGATIVE Final    Comment: (NOTE) SARS-CoV-2 target nucleic acids are NOT DETECTED.  The SARS-CoV-2 RNA is generally detectable in upper respiratory specimens during the acute phase of infection. The lowest concentration of SARS-CoV-2 viral copies this assay can detect is 138 copies/mL. A negative result does not preclude SARS-Cov-2 infection and should not be used as the sole basis for treatment or other patient management decisions. A negative result may occur with  improper specimen collection/handling, submission of specimen other than nasopharyngeal swab, presence of viral mutation(s) within the areas targeted by this assay, and inadequate number of viral copies(<138 copies/mL). A negative result must be combined with clinical observations, patient history, and epidemiological information. The expected result is Negative.  Fact Sheet for Patients:  EntrepreneurPulse.com.au  Fact Sheet for Healthcare Providers:  IncredibleEmployment.be  This test is no t yet approved or cleared by the [place] FDA and  has been authorized for detection and/or diagnosis of SARS-CoV-2 by FDA under an Emergency Use Authorization (EUA). This EUA will remain  in effect (meaning this test can be used) for the duration of the COVID-19 declaration under Section 564(b)(1) of the Act, 21 U.S.C.section 360bbb-3(b)(1), unless the authorization is terminated  or revoked sooner.       Influenza A by PCR NEGATIVE NEGATIVE Final   Influenza B by PCR NEGATIVE NEGATIVE Final    Comment: (NOTE) The Xpert Xpress SARS-CoV-2/FLU/RSV plus assay is intended as an aid in the diagnosis of influenza from Nasopharyngeal  swab specimens and should not be used as a sole basis for treatment. Nasal washings and aspirates are unacceptable for Xpert Xpress SARS-CoV-2/FLU/RSV testing.  Fact Sheet for Patients: EntrepreneurPulse.com.au  Fact Sheet for Healthcare Providers: IncredibleEmployment.be  This test is not yet approved or cleared by the [place] FDA and has been authorized for detection and/or diagnosis of SARS-CoV-2 by FDA under an Emergency Use Authorization (EUA). This EUA will remain in effect (meaning this test can be used) for the duration of the COVID-19 declaration under Section 564(b)(1) of the Act, 21 U.S.C. section 360bbb-3(b)(1), unless the authorization is terminated or revoked.  Performed at [facility], [address] [place], [place] [address]   Blood culture (routine single)     Status: None   Collection Time: 08/20/2020  9:17 AM   Specimen: BLOOD  Result Value Ref Range Status   Specimen Description BLOOD LEFT ANTECUBITAL  Final   Special Requests   Final    BOTTLES DRAWN AEROBIC AND ANAEROBIC Blood Culture results may not be optimal due to an inadequate volume of blood  received in culture bottles   Culture   Final    NO GROWTH 5 DAYS Performed at [facility], [address] [place], [place] [address]    Report Status 09/02/2020 FINAL  Final  Urine culture     Status: None   Collection Time: 08/11/2020  9:28 AM   Specimen: Urine, Random  Result Value Ref Range Status   Specimen Description   Final    URINE, RANDOM Performed at [facility], [address] [place], [place] [address]    Special Requests   Final    NONE Performed at [facility], [address] [place], [place] [address]    Culture   Final    NO GROWTH Performed at [facility], [place] [address], [place], [place] [address]    Report Status 08/29/2020 FINAL  Final  MRSA PCR Screening     Status: None   Collection  Time: 08/29/20  8:11 PM   Specimen: Nasal Mucosa; Nasopharyngeal  Result Value Ref Range Status   MRSA by PCR NEGATIVE NEGATIVE Final    Comment:        The GeneXpert MRSA Assay (FDA approved for NASAL specimens only), is one component of a comprehensive MRSA colonization surveillance program. It is not intended to diagnose MRSA infection nor to guide or monitor treatment for MRSA infections. Performed at [facility], [address] [place], [place] [address]          Radiology Studies: No results found.      Scheduled Meds: . cycloSPORINE  1 drop Both Eyes BID  . famotidine  20 mg Oral Daily  . LORazepam  0.5 mg Oral q1600  . QUEtiapine  12.5 mg Oral BID  . senna-docusate  1 tablet Oral BID   Continuous Infusions:        Aline August, MD [facility] 09/02/2020, 7:59 AM

## 2020-09-02 NOTE — Progress Notes (Signed)
[facility]) Hospital Liaison RN note:  Visited patient in room. Spoke with family at bedside. Unfortunately, Hospice [facility] is not able to offer a room today. Hospital care team is aware. [facility] Liaison will continue to follow for room availability.  Please call with any hospice related questions or concerns.  Zandra Abts, RN [facility] Liaison  [phone]

## 2020-09-03 LAB — METHYLMALONIC ACID, SERUM: Methylmalonic Acid, Quantitative: 122 nmol/L (ref 0–378)

## 2020-09-29 NOTE — Death Summary Note (Signed)
Death Summary  Emma Kennedy UYQ:[id number] DOB: Jun 04, 1929 DOA: 08-29-20  PCP: Adin Hector, MD  Admit date: 08-29-2020 Date of Death: 2020-09-04 Time of Death: 7:32 AM   History of present illness:  87 year old female history of dementia, hypothyroidism, RA, chronic interstitial lung disease presented to the ED with acute metabolic encephalopathy after presenting from independent living facility for evaluation of altered mental status. Palliative care met with family and decision was made to transition to full comfort measures.  She passed away on 04-Sep-2020 at 7:32 AM.  Family was at bedside  Final Diagnoses:   Comfort measures only status Acute metabolic encephalopathy New left upper lobe consolidation/mass Generalized weakness Advanced dementia with behavioral disturbance Hypothyroidism Chronic interstitial lung disease Hyponatremia Rheumatoid arthritis GERD Constipation T3 compression fracture   The results of significant diagnostics from this hospitalization (including imaging, microbiology, ancillary and laboratory) are listed below for reference.    Significant Diagnostic Studies: CT Head Wo Contrast  Result Date: Aug 29, 2020 CLINICAL DATA:  87 year old female status post fall. EXAM: CT HEAD WITHOUT CONTRAST TECHNIQUE: Contiguous axial images were obtained from the base of the skull through the vertex without intravenous contrast. COMPARISON:  Brain MRI 02/07/2020.  Head CT 02/07/2020. FINDINGS: Brain: Cerebral volume not significantly changed. No midline shift, ventriculomegaly, mass effect, evidence of mass lesion, intracranial hemorrhage or evidence of cortically based acute infarction. Patchy and confluent bilateral white matter hypodensity appears stable since last year. No cortical encephalomalacia identified. Vascular: Advanced Calcified atherosclerosis at the skull base. No suspicious intracranial vascular hyperdensity. Skull: Stable, intact. Sinuses/Orbits:  Visualized paranasal sinuses and mastoids are stable and well aerated. Other: No orbit or scalp soft tissue injury identified today. IMPRESSION: Stable. No acute intracranial abnormality or acute traumatic injury identified. Electronically Signed   By: Genevie Ann M.D.   On: 08/29/20 11:56   CT Cervical Spine Wo Contrast  Result Date: 08/29/20 CLINICAL DATA:  87 year old female status post fall. EXAM: CT CERVICAL SPINE WITHOUT CONTRAST TECHNIQUE: Multidetector CT imaging of the cervical spine was performed without intravenous contrast. Multiplanar CT image reconstructions were also generated. COMPARISON:  CT cervical spine 02/07/2020. Head CT today reported separately. FINDINGS: Alignment: Preserved cervical lordosis. Cervicothoracic junction alignment is within normal limits. Bilateral posterior element alignment is within normal limits. Skull base and vertebrae: Osteopenia. Visualized skull base is intact. No atlanto-occipital dissociation. Preserved C1-C2 alignment. No acute osseous abnormality identified. Soft tissues and spinal canal: No prevertebral fluid or swelling. No visible canal hematoma. Stable and negative noncontrast visible neck soft tissues. Disc levels: Chronically ankylosed right side C4-C5 facets. Widespread chronic cervical disc calcification. But otherwise mild for age cervical spine degeneration. No significant spinal stenosis suspected. Upper chest: Reported separately today. IMPRESSION: 1. No acute traumatic injury identified in the cervical spine. Mild for age cervical degeneration. 2. See Thoracic Spine CT today reported separately. Electronically Signed   By: Genevie Ann M.D.   On: 2020/08/29 11:59   CT CHEST ABDOMEN PELVIS W CONTRAST  Result Date: 08/29/2020 CLINICAL DATA:  87 year old female with fall and chest, abdominal and pelvic pain. Altered mental status. EXAM: CT CHEST, ABDOMEN, AND PELVIS WITH CONTRAST TECHNIQUE: Multidetector CT imaging of the chest, abdomen and pelvis was  performed following the standard protocol during bolus administration of intravenous contrast. CONTRAST:  101m OMNIPAQUE IOHEXOL 300 MG/ML  SOLN COMPARISON:  04/28/2015 high-resolution chest CT and 10/24/2011 abdominal/pelvic CT FINDINGS: CT CHEST FINDINGS Cardiovascular: Normal heart size. Heavy coronary artery atherosclerotic calcifications are again noted. Thoracic aortic atherosclerotic  calcifications noted without aneurysm. No pericardial effusion. Mediastinum/Nodes: No enlarged mediastinal, hilar, or axillary lymph nodes. Thyroid gland, trachea, and esophagus demonstrate no significant findings. Lungs/Pleura: New 3.6 x 5.6 cm consolidation/mass within the anterior mid LEFT UPPER lobe noted with heterogeneous enhancement. A trace LEFT pleural effusion is noted. Chronic interstitial changes/interlobular septal thickening with mild biapical pleuroparenchymal scarring again noted. LEFT hemithorax volume loss is unchanged. No pneumothorax. Musculoskeletal: Severe T3 compression fracture with vertebra plana and 5 mm retropulsion of the posteroinferior endplate is age indeterminate. Fractures of the posterior LEFT 6th and 7th ribs appear subacute. CT ABDOMEN PELVIS FINDINGS Hepatobiliary: The liver and gallbladder are unremarkable. Minimal fullness of the CBD/intrahepatic biliary system noted. Pancreas: Unremarkable Spleen: No significant abnormality Adrenals/Urinary Tract: No acute renal abnormalities. Small areas of renal scarring again identified. Adrenal glands and bladder are within normal limits. Stomach/Bowel: A large amount of stool noted within the distal colon and rectum. No dilated small bowel loops are identified. No definite bowel wall thickening or inflammatory changes are noted. Vascular/Lymphatic: Aortic atherosclerosis. No enlarged abdominal or pelvic lymph nodes. Reproductive: Status post hysterectomy. No adnexal masses. Other: No ascites, focal collection or pneumoperitoneum. Musculoskeletal: No  acute or suspicious bony abnormalities are noted. Multilevel degenerative changes within the lumbar spine and L5-S1 spondylolisthesis again noted. IMPRESSION: 1. New 3.6 x 5.6 cm consolidation/mass within the anterior mid LEFT UPPER lobe - suspicious for malignancy, with infection less likely. Trace LEFT pleural effusion. 2. Severe T3 compression fracture with vertebra plana and 5 mm retropulsion of the posteroinferior endplate, age indeterminate. 3. Fractures of the posterior LEFT 6th and 7th ribs - appear subacute. 4. No evidence of acute injury within the abdomen or pelvis. 5. Large amount of stool within the distal colon and rectum. 6. Chronic interstitial lung disease. 7. Coronary artery disease 8. Aortic atherosclerosis (ICD10-I70.0). Electronically Signed   By: Margarette Canada M.D.   On: 07/31/2020 12:32   CT T-SPINE NO CHARGE  Result Date: 08/19/2020 CLINICAL DATA:  87 year old female status post fall. EXAM: CT THORACIC SPINE WITH CONTRAST TECHNIQUE: Multiplanar CT images of the thoracic spine were reconstructed from contemporary CT of the Chest. CONTRAST:  No additional COMPARISON:  Cervical spine CT and CT Chest, Abdomen, and Pelvis today are reported separately. Chest CT 04/28/2015. Chest and rib series 05/08/2020. FINDINGS: Limited cervical spine imaging: Reported separately today. Cervicothoracic junction alignment is within normal limits. Thoracic spine segmentation:  Normal. Alignment: Abnormally exaggerated upper thoracic kyphosis related to the T3 finding. Relatively normal thoracic vertebral height and alignment elsewhere. Vertebrae: Osteopenia. Severe T3 compression fracture with vertebra plana (series 5, image 27). This is new since 2016, unclear whether this was present in January. The compressed vertebral body is sclerotic. Retropulsion of the posteroinferior endplate up to 5 mm, although the bony AP spinal canal remains about 8 mm none the less (series 6, image 81). T3 pedicles and posterior  elements appear to remain intact with underlying chronic T3-T4 facet ankylosis which was present in 2016. T1 and T2 appear stable and intact. T4 through T12 appear stable and intact. a un healed posterior 7th rib fracture is new since 2016. Although there does appear to be some new periosteal bone formation there. Similar appearance of the posterior left 6th and 7th ribs at the costovertebral junctions. No other posterior rib fracture identified. Paraspinal and other soft tissues: Thoracic paraspinal soft tissues remain within normal limits. Chest and abdomen are reported separately today. Disc levels: Mild for age thoracic spine degeneration, with no  CT evidence of significant spinal stenosis outside of the T3 level described above. IMPRESSION: 1. Severe T3 compression fracture with vertebra plana and 5 mm retropulsion of the posteroinferior endplate resulting in mild spinal stenosis. This is age indeterminate. 2. No other acute traumatic injury identified in the thoracic spine. 3. Subacute appearing comminuted fracture of the left posterior 7th rib, and the left 6th and 7th rib costovertebral junctions. 4.  CT Chest, Abdomen, and Pelvis today are reported separately. Electronically Signed   By: Genevie Ann M.D.   On: 08/01/2020 12:08   CT L-SPINE NO CHARGE  Result Date: 08/15/2020 CLINICAL DATA:  87 year old female status post fall. EXAM: CT LUMBAR SPINE WITH CONTRAST TECHNIQUE: Technique: Multiplanar CT images of the lumbar spine were reconstructed from contemporary CT of the Abdomen and Pelvis. CONTRAST:  No additional COMPARISON:  Thoracic spine CT today and CT Chest, Abdomen, and Pelvis today are reported separately. MRI lumbar spine 08/08/2018. FINDINGS: Segmentation: Normal, concordant with the thoracic spine numbering today. Alignment: Chronic levoconvex thoracic scoliosis and spondylolisthesis at L4-L5 (grade 1 and L5-S1 (grade 2) appears stable since 2020. Vertebrae: Diffuse osteopenia. Visible lower  thoracic levels and lumbar vertebrae appear intact. Mild chronic endplate deformity inferiorly at L5 is stable. Previous sacroplasty. No recurrent sacral fracture is evident. Paraspinal and other soft tissues: Abdomen and pelvis are reported separately today. Lumbar paraspinal soft tissues remain within normal limits. Disc levels: Stable lumbar spine degeneration since 2020, with only mild lower lumbar spinal stenosis despite the chronic spondylolisthesis. IMPRESSION: 1. No acute traumatic injury identified in the lumbar spine. Lumbar spine spondylolisthesis and degeneration appears stable from a 2020 MRI. 2.  CT Chest, Abdomen, and Pelvis today are reported separately. Electronically Signed   By: Genevie Ann M.D.   On: 08/01/2020 12:11   DG Chest Port 1 View  Result Date: 08/12/2020 CLINICAL DATA:  87 year old female status post fall. Altered mental status. EXAM: PORTABLE CHEST 1 VIEW COMPARISON:  Chest radiographs 05/08/2020 and earlier. FINDINGS: Portable AP upright view at 0905 hours. Chronic left lung volume loss and architectural distortion. Stable cardiac size and mediastinal contours. Calcified aortic atherosclerosis. Visualized tracheal air column is within normal limits. The right lung remains clear when allowing for portable technique. Degenerative osseous changes at the [facility] joints. No acute osseous abnormality identified. Negative visible bowel gas pattern. IMPRESSION: No acute cardiopulmonary abnormality. Stable chronic left lung disease Electronically Signed   By: Genevie Ann M.D.   On: 08/13/2020 09:38    Microbiology: Recent Results (from the past 240 hour(s))  Resp Panel by RT-PCR (Flu A&B, Covid) Nasopharyngeal Swab     Status: None   Collection Time: 08/24/2020  9:17 AM   Specimen: Nasopharyngeal Swab; Nasopharyngeal(NP) swabs in vial transport medium  Result Value Ref Range Status   SARS Coronavirus 2 by RT PCR NEGATIVE NEGATIVE Final    Comment: (NOTE) SARS-CoV-2 target nucleic acids are NOT  DETECTED.  The SARS-CoV-2 RNA is generally detectable in upper respiratory specimens during the acute phase of infection. The lowest concentration of SARS-CoV-2 viral copies this assay can detect is 138 copies/mL. A negative result does not preclude SARS-Cov-2 infection and should not be used as the sole basis for treatment or other patient management decisions. A negative result may occur with  improper specimen collection/handling, submission of specimen other than nasopharyngeal swab, presence of viral mutation(s) within the areas targeted by this assay, and inadequate number of viral copies(<138 copies/mL). A negative result must be combined with clinical observations,  patient history, and epidemiological information. The expected result is Negative.  Fact Sheet for Patients:  EntrepreneurPulse.com.au  Fact Sheet for Healthcare Providers:  IncredibleEmployment.be  This test is no t yet approved or cleared by the [place] FDA and  has been authorized for detection and/or diagnosis of SARS-CoV-2 by FDA under an Emergency Use Authorization (EUA). This EUA will remain  in effect (meaning this test can be used) for the duration of the COVID-19 declaration under Section 564(b)(1) of the Act, 21 U.S.C.section 360bbb-3(b)(1), unless the authorization is terminated  or revoked sooner.       Influenza A by PCR NEGATIVE NEGATIVE Final   Influenza B by PCR NEGATIVE NEGATIVE Final    Comment: (NOTE) The Xpert Xpress SARS-CoV-2/FLU/RSV plus assay is intended as an aid in the diagnosis of influenza from Nasopharyngeal swab specimens and should not be used as a sole basis for treatment. Nasal washings and aspirates are unacceptable for Xpert Xpress SARS-CoV-2/FLU/RSV testing.  Fact Sheet for Patients: EntrepreneurPulse.com.au  Fact Sheet for Healthcare Providers: IncredibleEmployment.be  This test is not yet  approved or cleared by the [place] FDA and has been authorized for detection and/or diagnosis of SARS-CoV-2 by FDA under an Emergency Use Authorization (EUA). This EUA will remain in effect (meaning this test can be used) for the duration of the COVID-19 declaration under Section 564(b)(1) of the Act, 21 U.S.C. section 360bbb-3(b)(1), unless the authorization is terminated or revoked.  Performed at [facility], [address] [place], [place] [address]   Blood culture (routine single)     Status: None   Collection Time: 08/12/2020  9:17 AM   Specimen: BLOOD  Result Value Ref Range Status   Specimen Description BLOOD LEFT ANTECUBITAL  Final   Special Requests   Final    BOTTLES DRAWN AEROBIC AND ANAEROBIC Blood Culture results may not be optimal due to an inadequate volume of blood received in culture bottles   Culture   Final    NO GROWTH 5 DAYS Performed at [facility], [address], [place], [place] [address]    Report Status 09/02/2020 FINAL  Final  Urine culture     Status: None   Collection Time: 08/04/2020  9:28 AM   Specimen: Urine, Random  Result Value Ref Range Status   Specimen Description   Final    URINE, RANDOM Performed at [facility], [address], [place], [place] [address]    Special Requests   Final    NONE Performed at [facility], [address], [place], [place] [address]    Culture   Final    NO GROWTH Performed at [facility], [place] [address] [place], [place] [address]    Report Status 08/29/2020 FINAL  Final  MRSA PCR Screening     Status: None   Collection Time: 08/29/20  8:11 PM   Specimen: Nasal Mucosa; Nasopharyngeal  Result Value Ref Range Status   MRSA by PCR NEGATIVE NEGATIVE Final    Comment:        The GeneXpert MRSA Assay (FDA approved for NASAL specimens only), is one component of a comprehensive MRSA colonization surveillance program. It is not intended to diagnose  MRSA infection nor to guide or monitor treatment for MRSA infections. Performed at [facility], [address] [place], [place] [address]      Labs: Basic Metabolic Panel: Recent Labs  Lab 08/07/2020 [phone] 08/29/20 0517 08/30/20 0416  NA 132* 137 132*  K 4.3 4.1 3.6  CL 97* 99 98  CO2 '26 28 25  ' GLUCOSE 135* 86 90  BUN 19 27* 17  CREATININE 0.74 0.62 0.49  CALCIUM 9.6 9.5 9.0  MG 2.2 1.9 1.7   Liver Function Tests: Recent Labs  Lab 08/12/2020 0917 08/29/20 0517  AST 26 25  ALT 13 13  ALKPHOS 84 71  BILITOT 0.6 0.6  PROT 6.4* 6.5  ALBUMIN 3.4* 3.5   No results for input(s): LIPASE, AMYLASE in the last 168 hours. No results for input(s): AMMONIA in the last 168 hours. CBC: Recent Labs  Lab 08/15/2020 0917 08/29/20 0517 08/30/20 0416  WBC 8.8 10.1 8.0  NEUTROABS  --   --  6.2  HGB 12.4 11.8* 11.2*  HCT 37.9 35.4* 33.7*  MCV 94.3 92.4 92.3  PLT 371 332 308   Cardiac Enzymes: Recent Labs  Lab 08/09/2020 2035  CKTOTAL 102   D-Dimer No results for input(s): DDIMER in the last 72 hours. BNP: Invalid input(s): POCBNP CBG: No results for input(s): GLUCAP in the last 168 hours. Anemia work up No results for input(s): VITAMINB12, FOLATE, FERRITIN, TIBC, IRON, RETICCTPCT in the last 72 hours. Urinalysis    Component Value Date/Time   COLORURINE AMBER (A) 08/27/2020 0928   APPEARANCEUR CLEAR (A) 08/24/2020 0928   LABSPEC 1.013 08/04/2020 0928   PHURINE 6.0 08/01/2020 0928   GLUCOSEU NEGATIVE 07/30/2020 0928   HGBUR NEGATIVE 08/27/2020 0928   BILIRUBINUR NEGATIVE 08/27/2020 0928   KETONESUR 5 (A) 08/27/2020 0928   PROTEINUR 30 (A) 08/02/2020 0928   NITRITE NEGATIVE 08/10/2020 0928   LEUKOCYTESUR NEGATIVE 08/02/2020 0928   Sepsis Labs Invalid input(s): PROCALCITONIN,  WBC,  LACTICIDVEN     SIGNED:  Aline August, MD  [facility] Sep 12, 2020, 8:57 AM

## 2020-09-29 NOTE — Care Management Important Message (Signed)
Important Message  Patient Details  Name: Emma Kennedy MRN: [id number] Date of Birth: 1929-08-01   Medicare Important Message Given:  Other (see comment)   On comfort care measures with plan to discharge to [facility] once bed available.  Medicare IM withheld at this time out of respect for patient and family.     Dannette Barbara 09/28/2020, 8:30 AM

## 2020-09-29 DEATH — deceased
